# Patient Record
Sex: Male | Born: 1965 | Race: White | Hispanic: No | Smoking: Former smoker
Health system: Southern US, Community
[De-identification: ages and names within clinical notes are randomized; demographics above are authoritative.]

## PROBLEM LIST (undated history)

## (undated) DIAGNOSIS — K5792 Diverticulitis of intestine, part unspecified, without perforation or abscess without bleeding: Secondary | ICD-10-CM

## (undated) DIAGNOSIS — K760 Fatty (change of) liver, not elsewhere classified: Secondary | ICD-10-CM

## (undated) DIAGNOSIS — I10 Essential (primary) hypertension: Secondary | ICD-10-CM

## (undated) DIAGNOSIS — F419 Anxiety disorder, unspecified: Secondary | ICD-10-CM

## (undated) DIAGNOSIS — K589 Irritable bowel syndrome without diarrhea: Secondary | ICD-10-CM

## (undated) DIAGNOSIS — F329 Major depressive disorder, single episode, unspecified: Secondary | ICD-10-CM

## (undated) DIAGNOSIS — K859 Acute pancreatitis without necrosis or infection, unspecified: Secondary | ICD-10-CM

## (undated) DIAGNOSIS — K219 Gastro-esophageal reflux disease without esophagitis: Secondary | ICD-10-CM

## (undated) DIAGNOSIS — E785 Hyperlipidemia, unspecified: Secondary | ICD-10-CM

## (undated) DIAGNOSIS — F32A Depression, unspecified: Secondary | ICD-10-CM

## (undated) DIAGNOSIS — M1990 Unspecified osteoarthritis, unspecified site: Secondary | ICD-10-CM

## (undated) DIAGNOSIS — F1910 Other psychoactive substance abuse, uncomplicated: Secondary | ICD-10-CM

## (undated) DIAGNOSIS — R972 Elevated prostate specific antigen [PSA]: Secondary | ICD-10-CM

## (undated) HISTORY — DX: Essential (primary) hypertension: I10

## (undated) HISTORY — DX: Hyperlipidemia, unspecified: E78.5

## (undated) HISTORY — PX: PROSTATE BIOPSY: SHX241

## (undated) HISTORY — DX: Irritable bowel syndrome, unspecified: K58.9

## (undated) HISTORY — PX: TONSILECTOMY, ADENOIDECTOMY, BILATERAL MYRINGOTOMY AND TUBES: SHX2538

---

## 1995-03-19 HISTORY — PX: KNEE SURGERY: SHX244

## 2009-03-18 HISTORY — PX: SHOULDER ARTHROSCOPY WITH BICEPS TENDON REPAIR: SHX5674

## 2012-06-24 ENCOUNTER — Other Ambulatory Visit: Payer: Self-pay | Admitting: Family Medicine

## 2012-06-24 ENCOUNTER — Other Ambulatory Visit: Payer: Self-pay | Admitting: Nurse Practitioner

## 2012-06-25 NOTE — Telephone Encounter (Signed)
LAST SEEN 8/13

## 2012-06-25 NOTE — Telephone Encounter (Signed)
LAST LABS 8/13. 

## 2012-08-17 ENCOUNTER — Telehealth: Payer: Self-pay | Admitting: Family Medicine

## 2012-08-17 NOTE — Telephone Encounter (Signed)
APPT MADE

## 2012-08-18 ENCOUNTER — Ambulatory Visit (INDEPENDENT_AMBULATORY_CARE_PROVIDER_SITE_OTHER): Payer: 59 | Admitting: Physician Assistant

## 2012-08-18 ENCOUNTER — Encounter: Payer: Self-pay | Admitting: Physician Assistant

## 2012-08-18 VITALS — BP 149/82 | HR 77 | Temp 98.1°F | Ht 71.0 in | Wt 166.0 lb

## 2012-08-18 DIAGNOSIS — E785 Hyperlipidemia, unspecified: Secondary | ICD-10-CM

## 2012-08-18 DIAGNOSIS — J019 Acute sinusitis, unspecified: Secondary | ICD-10-CM

## 2012-08-18 DIAGNOSIS — I10 Essential (primary) hypertension: Secondary | ICD-10-CM

## 2012-08-18 DIAGNOSIS — W57XXXA Bitten or stung by nonvenomous insect and other nonvenomous arthropods, initial encounter: Secondary | ICD-10-CM

## 2012-08-18 MED ORDER — DOXYCYCLINE HYCLATE 100 MG PO TABS: 100.0000 mg | ORAL_TABLET | Freq: Two times a day (BID) | ORAL | Status: DC

## 2012-08-18 NOTE — Progress Notes (Signed)
Subjective:     Patient ID: Seth Mcmillan, male   DOB: 12-Apr-1965, 47 y.o.   MRN: [id number]  HPI Pt with progressive sinus pain and congestion over the last week Pain to the L side of the face as well + fever and general malaise Pt with hx of same He has also had mult tick bites  Review of Systems  All other systems reviewed and are negative.       Objective:   Physical Exam  Nursing note and vitals reviewed. Ears- canals/TM's nl Oral- no tonsils, no increase in tonsil size, no exudate No cerv nodes Heart- RRR w/o M Lungs- CTA + TTP L maxillary sinus     Assessment:     1. Sinusitis, acute   2. Tick bite   3. HTN (hypertension)   4. Other and unspecified hyperlipidemia        Plan:     OTC meds for sx Doxycycline rx F/U prn

## 2012-08-18 NOTE — Patient Instructions (Signed)

## 2012-08-28 ENCOUNTER — Other Ambulatory Visit: Payer: Self-pay | Admitting: Family Medicine

## 2012-08-31 NOTE — Telephone Encounter (Signed)
You are only person he has seen since 08/13?

## 2012-09-09 ENCOUNTER — Other Ambulatory Visit: Payer: Self-pay | Admitting: *Deleted

## 2012-09-09 MED ORDER — FENOFIBRATE 145 MG PO TABS: 145.0000 mg | ORAL_TABLET | Freq: Every day | ORAL | Status: DC

## 2012-09-09 MED ORDER — ROSUVASTATIN CALCIUM 10 MG PO TABS: 10.0000 mg | ORAL_TABLET | Freq: Every day | ORAL | Status: DC

## 2012-09-09 NOTE — Telephone Encounter (Signed)
LAST LABS 8/13. 

## 2012-10-30 ENCOUNTER — Other Ambulatory Visit: Payer: Self-pay | Admitting: Physician Assistant

## 2012-11-11 ENCOUNTER — Telehealth: Payer: Self-pay | Admitting: Nurse Practitioner

## 2012-11-11 NOTE — Telephone Encounter (Signed)
APPT MADE

## 2012-11-12 ENCOUNTER — Encounter: Payer: Self-pay | Admitting: Family Medicine

## 2012-11-12 ENCOUNTER — Ambulatory Visit (INDEPENDENT_AMBULATORY_CARE_PROVIDER_SITE_OTHER): Payer: 59 | Admitting: Family Medicine

## 2012-11-12 VITALS — BP 145/97 | HR 78 | Temp 97.9°F | Ht 71.0 in | Wt 168.8 lb

## 2012-11-12 DIAGNOSIS — M7711 Lateral epicondylitis, right elbow: Secondary | ICD-10-CM

## 2012-11-12 DIAGNOSIS — M7710 Lateral epicondylitis, unspecified elbow: Secondary | ICD-10-CM

## 2012-11-12 MED ORDER — NAPROXEN 500 MG PO TABS: 500.0000 mg | ORAL_TABLET | Freq: Two times a day (BID) | ORAL | Status: DC

## 2012-11-12 NOTE — Progress Notes (Signed)
  Subjective:    Patient ID: DEMETREUS LOTHAMER, male    DOB: December 05, 1965, 47 y.o.   MRN: [id number]  HPI This 47 y.o. male presents for evaluation of left elbow pain and discomfort.  He has been Doing a lot of repetitive movement with his left elbow and he gets pain in this from time to time. He has had injection to his left elbow in the past and this worked.   Review of Systems C/o left elbow pain.   No chest pain, SOB, HA, dizziness, vision change, N/V, diarrhea, constipation, dysuria, urinary urgency or frequency  or rash.  Objective:   Physical Exam  Vital signs noted  Well developed well nourished male.  HEENT - Head atraumatic Normocephalic Respiratory - Lungs CTA bilateral Cardiac - RRR S1 and S2 without murmur MS - TTP left lateral epicondyle.  Left lateral epicondyle prepped with ETOH and ethyl chloride sprayed for anesthesia and then injected in trigger point fashion With 1/2 cc of kenalog and 1/2 cc of lidocaine.  Patient tolerates well.      Assessment & Plan:  Lateral epicondylitis, right - Plan: naproxen (NAPROSYN) 500 MG tablet

## 2012-11-12 NOTE — Patient Instructions (Signed)
Lateral Epicondylitis (Tennis Elbow) with Rehab Lateral epicondylitis involves inflammation and pain around the outer portion of the elbow. The pain is caused by inflammation of the tendons in the forearm that bring back (extend) the wrist. Lateral epicondylittis is also called tennis elbow, because it is very common in tennis players. However, it may occur in any individual who extends the wrist repetitively. If lateral epicondylitis is left untreated, it may become a chronic problem. SYMPTOMS   Pain, tenderness, and inflammation on the outer (lateral) side of the elbow.  Pain or weakness with gripping activities.  Pain that increases with wrist twisting motions (playing tennis, using a screwdriver, opening a door or a jar).  Pain with lifting objects, including a coffee cup. CAUSES  Lateral epicondylitis is caused by inflammation of the tendons that extend the wrist. Causes of injury may include:  Repetitive stress and strain on the muscles and tendons that extend the wrist.  Sudden change in activity level or intensity.  Incorrect grip in racquet sports.  Incorrect grip size of racquet (often too large).  Incorrect hitting position or technique (usually backhand, leading with the elbow).  Using a racket that is too heavy. RISK INCREASES WITH:  Sports or occupations that require repetitive and/or strenuous forearm and wrist movements (tennis, squash, racquetball, carpentry).  Poor wrist and forearm strength and flexibility.  Failure to warm up properly before activity.  Resuming activity before healing, rehabilitation, and conditioning are complete. PREVENTION   Warm up and stretch properly before activity.  Maintain physical fitness:  Strength, flexibility, and endurance.  Cardiovascular fitness.  Wear and use properly fitted equipment.  Learn and use proper technique and have a coach correct improper technique.  Wear a tennis elbow (counterforce) brace. PROGNOSIS   The course of this condition depends on the degree of the injury. If treated properly, acute cases (symptoms lasting less than 4 weeks) are often resolved in 2 to 6 weeks. Chronic (longer lasting cases) often resolve in 3 to 6 months, but may require physical therapy. RELATED COMPLICATIONS   Frequently recurring symptoms, resulting in a chronic problem. Properly treating the problem the first time decreases frequency of recurrence.  Chronic inflammation, scarring tendon degeneration, and partial tendon tear, requiring surgery.  Delayed healing or resolution of symptoms. TREATMENT  Treatment first involves the use of ice and medicine, to reduce pain and inflammation. Strengthening and stretching exercises may help reduce discomfort, if performed regularly. These exercises may be performed at home, if the condition is an acute injury. Chronic cases may require a referral to a physical therapist for evaluation and treatment. Your caregiver may advise a corticosteroid injection, to help reduce inflammation. Rarely, surgery is needed. MEDICATION  If pain medicine is needed, nonsteroidal anti-inflammatory medicines (aspirin and ibuprofen), or other minor pain relievers (acetaminophen), are often advised.  Do not take pain medicine for 7 days before surgery.  Prescription pain relievers may be given, if your caregiver thinks they are needed. Use only as directed and only as much as you need.  Corticosteroid injections may be recommended. These injections should be reserved only for the most severe cases, because they can only be given a certain number of times. HEAT AND COLD  Cold treatment (icing) should be applied for 10 to 15 minutes every 2 to 3 hours for inflammation and pain, and immediately after activity that aggravates your symptoms. Use ice packs or an ice massage.  Heat treatment may be used before performing stretching and strengthening activities prescribed by your   caregiver, physical  therapist, or athletic trainer. Use a heat pack or a warm water soak. SEEK MEDICAL CARE IF: Symptoms get worse or do not improve in 2 weeks, despite treatment. EXERCISES  RANGE OF MOTION (ROM) AND STRETCHING EXERCISES - Epicondylitis, Lateral (Tennis Elbow) These exercises may help you when beginning to rehabilitate your injury. Your symptoms may go away with or without further involvement from your physician, physical therapist or athletic trainer. While completing these exercises, remember:   Restoring tissue flexibility helps normal motion to return to the joints. This allows healthier, less painful movement and activity.  An effective stretch should be held for at least 30 seconds.  A stretch should never be painful. You should only feel a gentle lengthening or release in the stretched tissue. RANGE OF MOTION  Wrist Flexion, Active-Assisted  Extend your right / left elbow with your fingers pointing down.*  Gently pull the back of your hand towards you, until you feel a gentle stretch on the top of your forearm.  Hold this position for __________ seconds. Repeat __________ times. Complete this exercise __________ times per day.  *If directed by your physician, physical therapist or athletic trainer, complete this stretch with your elbow bent, rather than extended. RANGE OF MOTION  Wrist Extension, Active-Assisted  Extend your right / left elbow and turn your palm upwards.*  Gently pull your palm and fingertips back, so your wrist extends and your fingers point more toward the ground.  You should feel a gentle stretch on the inside of your forearm.  Hold this position for __________ seconds. Repeat __________ times. Complete this exercise __________ times per day. *If directed by your physician, physical therapist or athletic trainer, complete this stretch with your elbow bent, rather than extended. STRETCH - Wrist Flexion  Place the back of your right / left hand on a tabletop,  leaving your elbow slightly bent. Your fingers should point away from your body.  Gently press the back of your hand down onto the table by straightening your elbow. You should feel a stretch on the top of your forearm.  Hold this position for __________ seconds. Repeat __________ times. Complete this stretch __________ times per day.  STRETCH  Wrist Extension   Place your right / left fingertips on a tabletop, leaving your elbow slightly bent. Your fingers should point backwards.  Gently press your fingers and palm down onto the table by straightening your elbow. You should feel a stretch on the inside of your forearm.  Hold this position for __________ seconds. Repeat __________ times. Complete this stretch __________ times per day.  STRENGTHENING EXERCISES - Epicondylitis, Lateral (Tennis Elbow) These exercises may help you when beginning to rehabilitate your injury. They may resolve your symptoms with or without further involvement from your physician, physical therapist or athletic trainer. While completing these exercises, remember:   Muscles can gain both the endurance and the strength needed for everyday activities through controlled exercises.  Complete these exercises as instructed by your physician, physical therapist or athletic trainer. Increase the resistance and repetitions only as guided.  You may experience muscle soreness or fatigue, but the pain or discomfort you are trying to eliminate should never worsen during these exercises. If this pain does get worse, stop and make sure you are following the directions exactly. If the pain is still present after adjustments, discontinue the exercise until you can discuss the trouble with your caregiver. STRENGTH Wrist Flexors  Sit with your right / left forearm palm-up and   fully supported on a table or countertop. Your elbow should be resting below the height of your shoulder. Allow your wrist to extend over the edge of the  surface.  Loosely holding a __________ weight, or a piece of rubber exercise band or tubing, slowly curl your hand up toward your forearm.  Hold this position for __________ seconds. Slowly lower the wrist back to the starting position in a controlled manner. Repeat __________ times. Complete this exercise __________ times per day.  STRENGTH  Wrist Extensors  Sit with your right / left forearm palm-down and fully supported on a table or countertop. Your elbow should be resting below the height of your shoulder. Allow your wrist to extend over the edge of the surface.  Loosely holding a __________ weight, or a piece of rubber exercise band or tubing, slowly curl your hand up toward your forearm.  Hold this position for __________ seconds. Slowly lower the wrist back to the starting position in a controlled manner. Repeat __________ times. Complete this exercise __________ times per day.  STRENGTH - Ulnar Deviators  Stand with a ____________________ weight in your right / left hand, or sit while holding a rubber exercise band or tubing, with your healthy arm supported on a table or countertop.  Move your wrist, so that your pinkie travels toward your forearm and your thumb moves away from your forearm.  Hold this position for __________ seconds and then slowly lower the wrist back to the starting position. Repeat __________ times. Complete this exercise __________ times per day STRENGTH - Radial Deviators  Stand with a ____________________ weight in your right / left hand, or sit while holding a rubber exercise band or tubing, with your injured arm supported on a table or countertop.  Raise your hand upward in front of you or pull up on the rubber tubing.  Hold this position for __________ seconds and then slowly lower the wrist back to the starting position. Repeat __________ times. Complete this exercise __________ times per day. STRENGTH  Forearm Supinators   Sit with your right /  left forearm supported on a table, keeping your elbow below shoulder height. Rest your hand over the edge, palm down.  Gently grip a hammer or a soup ladle.  Without moving your elbow, slowly turn your palm and hand upward to a "thumbs-up" position.  Hold this position for __________ seconds. Slowly return to the starting position. Repeat __________ times. Complete this exercise __________ times per day.  STRENGTH  Forearm Pronators   Sit with your right / left forearm supported on a table, keeping your elbow below shoulder height. Rest your hand over the edge, palm up.  Gently grip a hammer or a soup ladle.  Without moving your elbow, slowly turn your palm and hand upward to a "thumbs-up" position.  Hold this position for __________ seconds. Slowly return to the starting position. Repeat __________ times. Complete this exercise __________ times per day.  STRENGTH - Grip  Grasp a tennis ball, a dense sponge, or a large, rolled sock in your hand.  Squeeze as hard as you can, without increasing any pain.  Hold this position for __________ seconds. Release your grip slowly. Repeat __________ times. Complete this exercise __________ times per day.  STRENGTH - Elbow Extensors, Isometric  Stand or sit upright, on a firm surface. Place your right / left arm so that your palm faces your stomach, and it is at the height of your waist.  Place your opposite hand on   the underside of your forearm. Gently push up as your right / left arm resists. Push as hard as you can with both arms, without causing any pain or movement at your right / left elbow. Hold this stationary position for __________ seconds. Gradually release the tension in both arms. Allow your muscles to relax completely before repeating. Document Released: 03/04/2005 Document Revised: 05/27/2011 Document Reviewed: 06/16/2008 [employer] Patient Information 2014 [employer].  

## 2012-12-17 ENCOUNTER — Other Ambulatory Visit: Payer: Self-pay | Admitting: Family Medicine

## 2012-12-21 NOTE — Telephone Encounter (Signed)
Patient needs to be seen. Has exceeded time since last visit. Needs to bring all medications to next appointment.   

## 2012-12-21 NOTE — Telephone Encounter (Signed)
Last lipid 8/13   ACM

## 2012-12-30 ENCOUNTER — Encounter: Payer: Self-pay | Admitting: Family Medicine

## 2012-12-30 ENCOUNTER — Ambulatory Visit (INDEPENDENT_AMBULATORY_CARE_PROVIDER_SITE_OTHER): Payer: 59 | Admitting: Family Medicine

## 2012-12-30 VITALS — BP 144/87 | HR 75 | Temp 97.5°F | Ht 71.0 in | Wt 170.8 lb

## 2012-12-30 DIAGNOSIS — I10 Essential (primary) hypertension: Secondary | ICD-10-CM

## 2012-12-30 DIAGNOSIS — E785 Hyperlipidemia, unspecified: Secondary | ICD-10-CM

## 2012-12-30 DIAGNOSIS — Z0000 Encounter for general adult medical examination without abnormal findings: Secondary | ICD-10-CM

## 2012-12-30 LAB — POCT CBC
Granulocyte percent: 74.3 %G (ref 37–80)
HCT, POC: 43.5 % (ref 43.5–53.7)
Hemoglobin: 14.8 g/dL (ref 14.1–18.1)
Lymph, poc: 2 (ref 0.6–3.4)
MCH, POC: 31.5 pg — AB (ref 27–31.2)
MCHC: 34 g/dL (ref 31.8–35.4)
MCV: 92.8 fL (ref 80–97)
MPV: 8.7 fL (ref 0–99.8)
POC Granulocyte: 7.2 — AB (ref 2–6.9)
POC LYMPH PERCENT: 21.1 %L (ref 10–50)
Platelet Count, POC: 271 10*3/uL (ref 142–424)
RBC: 4.7 M/uL (ref 4.69–6.13)
RDW, POC: 13.3 %
WBC: 9.7 10*3/uL (ref 4.6–10.2)

## 2012-12-30 MED ORDER — FENOFIBRATE 145 MG PO TABS: 145.0000 mg | ORAL_TABLET | Freq: Every day | ORAL | Status: DC

## 2012-12-30 MED ORDER — AMLODIPINE BESYLATE 5 MG PO TABS: 5.0000 mg | ORAL_TABLET | Freq: Every day | ORAL | Status: DC

## 2012-12-30 MED ORDER — ROSUVASTATIN CALCIUM 10 MG PO TABS: 10.0000 mg | ORAL_TABLET | Freq: Every day | ORAL | Status: DC

## 2012-12-30 NOTE — Patient Instructions (Signed)
Hypertension Information As your heart beats, it forces blood through your arteries. This force is your blood pressure. If the pressure is too high, it is called hypertension (HTN) or high blood pressure. HTN is dangerous because you may have it and not know it. High blood pressure may mean that your heart has to work harder to pump blood. Your arteries may be narrow or stiff. The extra work puts you at risk for heart disease, stroke, and other problems.  Blood pressure consists of two numbers, a higher number over a lower, 110/72, for example. It is stated as "110 over 72." The ideal is below 120 for the top number (systolic) and under 80 for the bottom (diastolic).  You should pay close attention to your blood pressure if you have certain conditions such as:  Heart failure.   Prior heart attack.   Diabetes   Chronic kidney disease.   Prior stroke.   Multiple risk factors for heart disease.  To see if you have HTN, your blood pressure should be measured while you are seated with your arm held at the level of the heart. It should be measured at least twice. A one-time elevated blood pressure reading (especially in the Emergency Department) does not mean that you need treatment. There may be conditions in which the blood pressure is different between your right and left arms. It is important to see your caregiver soon for a recheck. Most people have essential hypertension which means that there is not a specific cause. This type of high blood pressure may be lowered by changing lifestyle factors such as:  Stress.   Smoking.   Lack of exercise.   Excessive weight.   Drug/tobacco/alcohol use.   Eating less salt.  Most people do not have symptoms from high blood pressure until it has caused damage to the body. Effective treatment can often prevent, delay or reduce that damage. TREATMENT  Treatment for high blood pressure, when a cause has been identified, is directed at the cause. There  are a large number of medications to treat HTN. These fall into several categories, and your caregiver will help you select the medicines that are best for you. Medications may have side effects. You should review side effects with your caregiver. If your blood pressure stays high after you have made lifestyle changes or started on medicines,   Your medication(s) may need to be changed.   Other problems may need to be addressed.   Be certain you understand your prescriptions, and know how and when to take your medicine.   Be sure to follow up with your caregiver within the time frame advised (usually within two weeks) to have your blood pressure rechecked and to review your medications.   If you are taking more than one medicine to lower your blood pressure, make sure you know how and at what times they should be taken. Taking two medicines at the same time can result in blood pressure that is too low.  Document Released: 05/07/2005 Document Revised: 11/14/2010 Document Reviewed: 05/14/2007 [employer] Patient Information 2012 [employer]. 

## 2012-12-30 NOTE — Progress Notes (Signed)
  Subjective:    Patient ID: Seth Mcmillan, male    DOB: 18-Apr-1965, 47 y.o.   MRN: [id number]  HPI This 47 y.o. male presents for evaluation of hypertension, hyperlipidemia, and get CPE. He has no acute medical problems.   Review of Systems No chest pain, SOB, HA, dizziness, vision change, N/V, diarrhea, constipation, dysuria, urinary urgency or frequency, myalgias, arthralgias or rash.     Objective:   Physical Exam  Vital signs noted  Well developed well nourished male.  HEENT - Head atraumatic Normocephalic                Eyes - PERRLA, Conjuctiva - clear Sclera- Clear EOMI                Ears - EAC's Wnl TM's Wnl Gross Hearing WNL                Nose - Nares patent                 Throat - oropharanx wnl Respiratory - Lungs CTA bilateral Cardiac - RRR S1 and S2 without murmur GI - Abdomen soft Nontender and bowel sounds active x 4 Extremities - No edema. Neuro - Grossly intact.      Assessment & Plan:  Routine general medical examination at a health care facility - Plan: amLODipine (NORVASC) 5 MG tablet, fenofibrate (TRICOR) 145 MG tablet, rosuvastatin (CRESTOR) 10 MG tablet, POCT CBC, CMP14+EGFR, Lipid panel, PSA, total and free, Thyroid Panel With TSH  Essential hypertension, benign - Plan: amLODipine (NORVASC) 5 MG tablet  Other and unspecified hyperlipidemia - Plan: fenofibrate (TRICOR) 145 MG tablet, rosuvastatin (CRESTOR) 10 MG tablet  Deatra Canter FNP

## 2012-12-31 LAB — CMP14+EGFR
ALT: 29 IU/L (ref 0–44)
AST: 36 IU/L (ref 0–40)
Albumin/Globulin Ratio: 2.2 (ref 1.1–2.5)
Albumin: 4.3 g/dL (ref 3.5–5.5)
Alkaline Phosphatase: 62 IU/L (ref 39–117)
BUN/Creatinine Ratio: 8 — ABNORMAL LOW (ref 9–20)
BUN: 9 mg/dL (ref 6–24)
CO2: 21 mmol/L (ref 18–29)
Calcium: 9.1 mg/dL (ref 8.7–10.2)
Chloride: 99 mmol/L (ref 97–108)
Creatinine, Ser: 1.08 mg/dL (ref 0.76–1.27)
GFR calc Af Amer: 93 mL/min/{1.73_m2} (ref >59)
GFR calc non Af Amer: 80 mL/min/{1.73_m2} (ref >59)
Globulin, Total: 2 g/dL (ref 1.5–4.5)
Glucose: 93 mg/dL (ref 65–99)
Potassium: 4.5 mmol/L (ref 3.5–5.2)
Sodium: 137 mmol/L (ref 134–144)
Total Bilirubin: 0.6 mg/dL (ref 0.0–1.2)
Total Protein: 6.3 g/dL (ref 6.0–8.5)

## 2012-12-31 LAB — PSA, TOTAL AND FREE
PSA, Free Pct: 17.6 %
PSA, Free: 0.44 ng/mL
PSA: 2.5 ng/mL (ref 0.0–4.0)

## 2012-12-31 LAB — THYROID PANEL WITH TSH
Free Thyroxine Index: 1.7 (ref 1.2–4.9)
T3 Uptake Ratio: 32 % (ref 24–39)
T4, Total: 5.2 ug/dL (ref 4.5–12.0)
TSH: 2.34 u[IU]/mL (ref 0.450–4.500)

## 2012-12-31 LAB — LIPID PANEL
Chol/HDL Ratio: 2.4 ratio units (ref 0.0–5.0)
Cholesterol, Total: 187 mg/dL (ref 100–199)
HDL: 79 mg/dL (ref >39)
LDL Calculated: 91 mg/dL (ref 0–99)
Triglycerides: 85 mg/dL (ref 0–149)
VLDL Cholesterol Cal: 17 mg/dL (ref 5–40)

## 2013-11-16 ENCOUNTER — Telehealth: Payer: Self-pay | Admitting: Family Medicine

## 2013-11-16 NOTE — Telephone Encounter (Signed)
appt given for 2:30 with Bill on 9/2

## 2013-11-17 ENCOUNTER — Ambulatory Visit (INDEPENDENT_AMBULATORY_CARE_PROVIDER_SITE_OTHER): Payer: 59 | Admitting: Family Medicine

## 2013-11-17 VITALS — BP 139/86 | HR 71 | Temp 97.4°F | Ht 71.0 in | Wt 166.0 lb

## 2013-11-17 DIAGNOSIS — M545 Low back pain, unspecified: Secondary | ICD-10-CM

## 2013-11-17 MED ORDER — NAPROXEN 500 MG PO TABS: 500.0000 mg | ORAL_TABLET | Freq: Two times a day (BID) | ORAL | Status: DC

## 2013-11-17 MED ORDER — CYCLOBENZAPRINE HCL 10 MG PO TABS: 10.0000 mg | ORAL_TABLET | Freq: Three times a day (TID) | ORAL | Status: DC | PRN

## 2013-11-17 NOTE — Progress Notes (Signed)
   Subjective:    Patient ID: Seth Mcmillan, male    DOB: 1966-02-27, 48 y.o.   MRN: [id number]  HPI  This 48 y.o. male presents for evaluation of back pain for a couple days.  Review of Systems No chest pain, SOB, HA, dizziness, vision change, N/V, diarrhea, constipation, dysuria, urinary urgency or frequency, myalgias, arthralgias or rash.     Objective:   Physical Exam  Vital signs noted  Well developed well nourished male.  HEENT - Head atraumatic Normocephalic Respiratory - Lungs CTA bilateral Cardiac - RRR S1 and S2 without murmur GI - Abdomen soft Nontender and bowel sounds active x 4 MS - TTP right LS  muscles     Assessment & Plan:  Right-sided low back pain without sciatica - Plan: naproxen (NAPROSYN) 500 MG tablet, cyclobenzaprine (FLEXERIL) 10 MG tablet  Lysbeth Penner FNP

## 2014-03-30 ENCOUNTER — Other Ambulatory Visit: Payer: Self-pay | Admitting: Family Medicine

## 2014-04-06 ENCOUNTER — Other Ambulatory Visit: Payer: Self-pay | Admitting: *Deleted

## 2014-04-06 DIAGNOSIS — Z0000 Encounter for general adult medical examination without abnormal findings: Secondary | ICD-10-CM

## 2014-04-06 DIAGNOSIS — I10 Essential (primary) hypertension: Secondary | ICD-10-CM

## 2014-04-06 MED ORDER — AMLODIPINE BESYLATE 5 MG PO TABS: 5.0000 mg | ORAL_TABLET | Freq: Every day | ORAL | Status: DC

## 2014-04-07 ENCOUNTER — Ambulatory Visit: Payer: Self-pay | Admitting: Family Medicine

## 2014-04-07 ENCOUNTER — Telehealth: Payer: Self-pay | Admitting: Family Medicine

## 2014-04-07 DIAGNOSIS — I10 Essential (primary) hypertension: Secondary | ICD-10-CM

## 2014-04-07 DIAGNOSIS — Z0000 Encounter for general adult medical examination without abnormal findings: Secondary | ICD-10-CM

## 2014-04-07 MED ORDER — AMLODIPINE BESYLATE 5 MG PO TABS: 5.0000 mg | ORAL_TABLET | Freq: Every day | ORAL | Status: DC

## 2014-04-07 NOTE — Telephone Encounter (Signed)
done

## 2014-04-27 ENCOUNTER — Encounter: Payer: Self-pay | Admitting: Family Medicine

## 2014-04-27 ENCOUNTER — Ambulatory Visit (INDEPENDENT_AMBULATORY_CARE_PROVIDER_SITE_OTHER): Payer: 59 | Admitting: Family Medicine

## 2014-04-27 VITALS — BP 135/85 | HR 70 | Temp 97.1°F | Ht 71.0 in | Wt 171.0 lb

## 2014-04-27 DIAGNOSIS — Z0000 Encounter for general adult medical examination without abnormal findings: Secondary | ICD-10-CM

## 2014-04-27 DIAGNOSIS — E785 Hyperlipidemia, unspecified: Secondary | ICD-10-CM

## 2014-04-27 DIAGNOSIS — I10 Essential (primary) hypertension: Secondary | ICD-10-CM

## 2014-04-27 MED ORDER — AMLODIPINE BESYLATE 5 MG PO TABS: 5.0000 mg | ORAL_TABLET | Freq: Every day | ORAL | Status: DC

## 2014-04-27 MED ORDER — FENOFIBRATE 145 MG PO TABS: 145.0000 mg | ORAL_TABLET | Freq: Every day | ORAL | Status: DC

## 2014-04-27 MED ORDER — ROSUVASTATIN CALCIUM 10 MG PO TABS: 10.0000 mg | ORAL_TABLET | Freq: Every day | ORAL | Status: DC

## 2014-04-27 NOTE — Progress Notes (Signed)
Subjective:    Patient ID: Seth Mcmillan, male    DOB: 10-27-1965, 49 y.o.   MRN: [id number]  HPI  49 year old gentleman here to follow-up lipids and blood pressure. He currently takes amlodipine, TriCor, and Crestor. He denies any side effects or problems with medication and he is basically asymptomatic. He was seen for 5 months ago for recurring back pain but that is not chronic. Reviewing his labs PSA was while in the normal range slightly elevated. He is not having any symptoms of infection or enlargement but we will repeat that again today. It has also been greater than 1 year since we checked his lipids and liver function tests and that needs to be checked  Patient Active Problem List   Diagnosis Date Noted  . HTN (hypertension) 08/18/2012  . Other and unspecified hyperlipidemia 08/18/2012   Outpatient Encounter Prescriptions as of 04/27/2014  Medication Sig  . amLODipine (NORVASC) 5 MG tablet Take 1 tablet (5 mg total) by mouth daily.  . fenofibrate (TRICOR) 145 MG tablet Take 1 tablet (145 mg total) by mouth daily.  . rosuvastatin (CRESTOR) 10 MG tablet Take 1 tablet (10 mg total) by mouth daily.  . [DISCONTINUED] cyclobenzaprine (FLEXERIL) 10 MG tablet Take 1 tablet (10 mg total) by mouth 3 (three) times daily as needed for muscle spasms.  . [DISCONTINUED] naproxen (NAPROSYN) 500 MG tablet Take 1 tablet (500 mg total) by mouth 2 (two) times daily with a meal.      Review of Systems  Constitutional: Negative.   HENT: Negative.   Eyes: Negative.   Respiratory: Negative.  Negative for shortness of breath.   Cardiovascular: Negative.  Negative for chest pain and leg swelling.  Gastrointestinal: Negative.   Genitourinary: Negative.   Musculoskeletal: Negative.   Skin: Negative.   Neurological: Negative.   Psychiatric/Behavioral: Negative.   All other systems reviewed and are negative.      Objective:   Physical Exam  Constitutional: He is oriented to person, place, and time.  He appears well-developed and well-nourished.  HENT:  Head: Normocephalic.  Right Ear: External ear normal.  Left Ear: External ear normal.  Nose: Nose normal.  Mouth/Throat: Oropharynx is clear and moist.  Eyes: Conjunctivae and EOM are normal. Pupils are equal, round, and reactive to light.  Neck: Normal range of motion. Neck supple.  Cardiovascular: Normal rate, regular rhythm, normal heart sounds and intact distal pulses.   Pulmonary/Chest: Effort normal and breath sounds normal.  Abdominal: Soft. Bowel sounds are normal.  Musculoskeletal: Normal range of motion.  Neurological: He is alert and oriented to person, place, and time.  Skin: Skin is warm and dry.  Psychiatric: He has a normal mood and affect. His behavior is normal. Judgment and thought content normal.    BP 135/85 mmHg  Pulse 70  Temp(Src) 97.1 F (36.2 C) (Oral)  Ht $R'5\' 11"'NH$  (1.803 m)  Wt 171 lb (77.565 kg)  BMI 23.86 kg/m2       Assessment & Plan:  1. Hyperlipidemia  - Lipid panel - CMP14+EGFR - rosuvastatin (CRESTOR) 10 MG tablet; Take 1 tablet (10 mg total) by mouth daily.  Dispense: 90 tablet; Refill: 1 - fenofibrate (TRICOR) 145 MG tablet; Take 1 tablet (145 mg total) by mouth daily.  Dispense: 90 tablet; Refill: 1  2. Essential hypertension Blood pressure well controlled on amlodipine. Continue same  3. Health care maintenance As noted above PSA was even though normal slightly elevated in the normal range and needs follow-up -  PSA, total and free  4. Routine general medical examination at a health care facility* - rosuvastatin (CRESTOR) 10 MG tablet; Take 1 tablet (10 mg total) by mouth daily.  Dispense: 90 tablet; Refill: 1 - fenofibrate (TRICOR) 145 MG tablet; Take 1 tablet (145 mg total) by mouth daily.  Dispense: 90 tablet; Refill: 1 - amLODipine (NORVASC) 5 MG tablet; Take 1 tablet (5 mg total) by mouth daily.  Dispense: 90 tablet; Refill: 1  5. Essential hypertension, benign  - amLODipine  (NORVASC) 5 MG tablet; Take 1 tablet (5 mg total) by mouth daily.  Dispense: 90 tablet; Refill: 1  Wardell Honour MD

## 2014-04-28 ENCOUNTER — Other Ambulatory Visit: Payer: Self-pay

## 2014-04-28 DIAGNOSIS — R972 Elevated prostate specific antigen [PSA]: Secondary | ICD-10-CM

## 2014-04-28 LAB — LIPID PANEL
CHOLESTEROL TOTAL: 189 mg/dL (ref 100–199)
Chol/HDL Ratio: 2.5 ratio units (ref 0.0–5.0)
HDL: 77 mg/dL (ref >39)
LDL Calculated: 88 mg/dL (ref 0–99)
Triglycerides: 122 mg/dL (ref 0–149)
VLDL Cholesterol Cal: 24 mg/dL (ref 5–40)

## 2014-04-28 LAB — CMP14+EGFR
ALBUMIN: 4.2 g/dL (ref 3.5–5.5)
ALK PHOS: 62 IU/L (ref 39–117)
ALT: 23 IU/L (ref 0–44)
AST: 28 IU/L (ref 0–40)
Albumin/Globulin Ratio: 1.9 (ref 1.1–2.5)
BILIRUBIN TOTAL: 0.4 mg/dL (ref 0.0–1.2)
BUN/Creatinine Ratio: 10 (ref 9–20)
BUN: 13 mg/dL (ref 6–24)
CHLORIDE: 99 mmol/L (ref 97–108)
CO2: 21 mmol/L (ref 18–29)
CREATININE: 1.26 mg/dL (ref 0.76–1.27)
Calcium: 9.2 mg/dL (ref 8.7–10.2)
GFR calc Af Amer: 76 mL/min/{1.73_m2} (ref >59)
GFR calc non Af Amer: 66 mL/min/{1.73_m2} (ref >59)
GLUCOSE: 103 mg/dL — AB (ref 65–99)
Globulin, Total: 2.2 g/dL (ref 1.5–4.5)
Potassium: 4.8 mmol/L (ref 3.5–5.2)
Sodium: 136 mmol/L (ref 134–144)
Total Protein: 6.4 g/dL (ref 6.0–8.5)

## 2014-04-28 LAB — PSA, TOTAL AND FREE
PSA FREE: 0.46 ng/mL
PSA, Free Pct: 12.4 %
PSA: 3.7 ng/mL (ref 0.0–4.0)

## 2015-04-03 ENCOUNTER — Ambulatory Visit: Payer: 59 | Admitting: Family Medicine

## 2015-04-04 ENCOUNTER — Telehealth: Payer: Self-pay | Admitting: Family Medicine

## 2015-04-04 DIAGNOSIS — Z0000 Encounter for general adult medical examination without abnormal findings: Secondary | ICD-10-CM

## 2015-04-04 DIAGNOSIS — I10 Essential (primary) hypertension: Secondary | ICD-10-CM

## 2015-04-06 NOTE — Telephone Encounter (Signed)
Last seen 04/2014 

## 2015-04-07 MED ORDER — AMLODIPINE BESYLATE 5 MG PO TABS: 5.0000 mg | ORAL_TABLET | Freq: Every day | ORAL | Status: DC

## 2015-04-07 NOTE — Telephone Encounter (Signed)
It appears that a prescription was called in on 117 but it's time for your yearly office visit

## 2015-04-11 ENCOUNTER — Ambulatory Visit: Payer: 59 | Admitting: Family Medicine

## 2015-04-11 NOTE — Telephone Encounter (Signed)
appt has been scheduled for 1/24

## 2015-04-12 ENCOUNTER — Encounter: Payer: Self-pay | Admitting: Family Medicine

## 2015-04-20 ENCOUNTER — Telehealth: Payer: Self-pay | Admitting: Family Medicine

## 2015-04-20 ENCOUNTER — Encounter: Payer: Self-pay | Admitting: Family Medicine

## 2015-04-20 ENCOUNTER — Ambulatory Visit (INDEPENDENT_AMBULATORY_CARE_PROVIDER_SITE_OTHER): Payer: 59 | Admitting: Family Medicine

## 2015-04-20 VITALS — BP 148/93 | HR 83 | Temp 97.0°F | Ht 71.0 in | Wt 175.0 lb

## 2015-04-20 DIAGNOSIS — E785 Hyperlipidemia, unspecified: Secondary | ICD-10-CM | POA: Diagnosis not present

## 2015-04-20 DIAGNOSIS — I10 Essential (primary) hypertension: Secondary | ICD-10-CM | POA: Diagnosis not present

## 2015-04-20 NOTE — Progress Notes (Signed)
   Subjective:    Patient ID: Seth Mcmillan, male    DOB: 1965/10/14, 50 y.o.   MRN: [id number]  Hypertension  Hyperlipidemia   Pt here for follow up and management of chronic medical problems which includes hyperlipidemia, and hypertension.  He is taking medications regularly. Only complaint today is some left elbow pain. This is been chronic over the past 15-20 years initially resulting from an injury. He has had steroid shots without much relief. He had elevated PSA and biopsy which showed prostate cancer. That is being followed every 3-6 months by urologist. No surgery is planned at present time. He did not take his medicines today and his blood pressure is elevated. In reviewing his medicines I noticed that he is on a statin as well as a fibrate. Latest recommendation is definitely not to use these together.       Patient Active Problem List   Diagnosis Date Noted  . HTN (hypertension) 08/18/2012  . Hyperlipidemia 08/18/2012   Outpatient Encounter Prescriptions as of 04/20/2015  Medication Sig  . amLODipine (NORVASC) 5 MG tablet Take 1 tablet (5 mg total) by mouth daily.  . fenofibrate (TRICOR) 145 MG tablet Take 1 tablet (145 mg total) by mouth daily.  . rosuvastatin (CRESTOR) 10 MG tablet Take 1 tablet (10 mg total) by mouth daily.   No facility-administered encounter medications on file as of 04/20/2015.     Review of Systems  Constitutional: Negative.   HENT: Negative.   Eyes: Negative.   Respiratory: Negative.   Cardiovascular: Negative.   Gastrointestinal: Negative.   Endocrine: Negative.   Genitourinary: Negative.   Musculoskeletal: Positive for arthralgias (left elbow pain ).  Skin: Negative.   Allergic/Immunologic: Negative.   Neurological: Negative.   Hematological: Negative.   Psychiatric/Behavioral: Negative.        Objective:   Physical Exam  Constitutional: He appears well-developed and well-nourished.  HENT:  Head: Normocephalic.  Cardiovascular:  Normal rate, regular rhythm and normal heart sounds.   Pulmonary/Chest: Effort normal and breath sounds normal.  Musculoskeletal:  Left elbow: There is no pain with supination and pronation against resistance. There is no pain with dorsiflexion of the hand against resistance. There is no tenderness over the lateral or medial epicondyles.   BP 148/93 mmHg  Pulse 83  Temp(Src) 97 F (36.1 C) (Oral)  Ht '5\' 11"'$  (1.803 m)  Wt 175 lb (79.379 kg)  BMI 24.42 kg/m2        Assessment & Plan:  1. Essential hypertension, benign [name] is elevated today without medicine I have asked him to check it some at drugstore. Goal would be 135/85 or below - CMP14+EGFR  2. Hyperlipidemia Pins are well managed but I would like him to stop TriCor and then we will repeat triglycerides/lipid panel in 6 months - Lipid panel  Wardell Honour MD

## 2015-04-21 LAB — CMP14+EGFR
A/G RATIO: 1.8 (ref 1.1–2.5)
ALT: 36 IU/L (ref 0–44)
AST: 43 IU/L — ABNORMAL HIGH (ref 0–40)
Albumin: 4.2 g/dL (ref 3.5–5.5)
Alkaline Phosphatase: 81 IU/L (ref 39–117)
BUN/Creatinine Ratio: 13 (ref 9–20)
BUN: 12 mg/dL (ref 6–24)
Bilirubin Total: 0.3 mg/dL (ref 0.0–1.2)
CALCIUM: 8.9 mg/dL (ref 8.7–10.2)
CO2: 22 mmol/L (ref 18–29)
Chloride: 101 mmol/L (ref 96–106)
Creatinine, Ser: 0.94 mg/dL (ref 0.76–1.27)
GFR, EST AFRICAN AMERICAN: 108 mL/min/{1.73_m2} (ref >59)
GFR, EST NON AFRICAN AMERICAN: 93 mL/min/{1.73_m2} (ref >59)
Globulin, Total: 2.3 g/dL (ref 1.5–4.5)
Glucose: 101 mg/dL — ABNORMAL HIGH (ref 65–99)
POTASSIUM: 4.4 mmol/L (ref 3.5–5.2)
Sodium: 139 mmol/L (ref 134–144)
TOTAL PROTEIN: 6.5 g/dL (ref 6.0–8.5)

## 2015-04-21 LAB — LIPID PANEL
Chol/HDL Ratio: 3 ratio units (ref 0.0–5.0)
Cholesterol, Total: 211 mg/dL — ABNORMAL HIGH (ref 100–199)
HDL: 70 mg/dL (ref >39)
LDL Calculated: 100 mg/dL — ABNORMAL HIGH (ref 0–99)
Triglycerides: 204 mg/dL — ABNORMAL HIGH (ref 0–149)
VLDL CHOLESTEROL CAL: 41 mg/dL — AB (ref 5–40)

## 2015-07-31 ENCOUNTER — Telehealth: Payer: Self-pay | Admitting: Family Medicine

## 2015-07-31 NOTE — Telephone Encounter (Signed)
No answer and the mailbox is full, appt has already been made with Dr.Miller tomorrow at 11.

## 2015-08-01 ENCOUNTER — Ambulatory Visit (INDEPENDENT_AMBULATORY_CARE_PROVIDER_SITE_OTHER): Payer: Commercial Managed Care - HMO | Admitting: Family Medicine

## 2015-08-01 ENCOUNTER — Encounter: Payer: Self-pay | Admitting: Family Medicine

## 2015-08-01 VITALS — BP 153/95 | HR 76 | Temp 97.7°F | Ht 71.0 in | Wt 168.6 lb

## 2015-08-01 DIAGNOSIS — R1084 Generalized abdominal pain: Secondary | ICD-10-CM | POA: Diagnosis not present

## 2015-08-01 NOTE — Progress Notes (Signed)
   Subjective:    Patient ID: Seth Mcmillan, male    DOB: 08-May-1965, 50 y.o.   MRN: [id number]  HPI 50 year old gentleman with a 3 to four-month history of diarrhea or increased bowel movements. He has had some pain is generalized after eating. He has had no blood in the diarrhea. He has had no weight loss. He has taken Imodium, OTC.  Patient Active Problem List   Diagnosis Date Noted  . HTN (hypertension) 08/18/2012  . Hyperlipidemia 08/18/2012   Outpatient Encounter Prescriptions as of 08/01/2015  Medication Sig  . amLODipine (NORVASC) 5 MG tablet Take 1 tablet (5 mg total) by mouth daily.  . rosuvastatin (CRESTOR) 10 MG tablet Take 1 tablet (10 mg total) by mouth daily.  . [DISCONTINUED] fenofibrate (TRICOR) 145 MG tablet Take 1 tablet (145 mg total) by mouth daily.   No facility-administered encounter medications on file as of 08/01/2015.      Review of Systems  Constitutional: Negative.   Gastrointestinal: Positive for nausea, abdominal pain and diarrhea.  Musculoskeletal: Negative.        Objective:   Physical Exam  Constitutional: He appears well-developed and well-nourished.  HENT:  Mouth/Throat: Oropharynx is clear and moist.  Cardiovascular: Normal rate and regular rhythm.   Pulmonary/Chest: Effort normal and breath sounds normal.  Abdominal: Soft. Bowel sounds are normal. He exhibits no mass. There is no tenderness. There is no guarding.          Assessment & Plan:  1. Generalized abdominal pain 3 to four-month history of diarrhea and abdominal pain. There is been no weight loss which makes me think this may be irritable bowel but since he is over 15) colonoscopy and would like him to have a screening exam at this time. Also with a postprandial nausea will check for gallbladder disease.  In the meantime I have asked him to stop all nonessential medicines. He takes several supplements that are designed to increase viriloity.. Also recommend holding all dairy  products including milk cheese ice cream. Begin probiotic, align. We will check him back after these had ultrasound colonoscopy and maybe these changes with his diet and medications.  Wardell Honour MD - Ambulatory referral to Gastroenterology

## 2015-08-01 NOTE — Patient Instructions (Addendum)
Align over the counter Stop all dairy products, vitamin C & D Return in a couple of weeks

## 2015-08-02 ENCOUNTER — Encounter: Payer: Self-pay | Admitting: Gastroenterology

## 2015-08-03 ENCOUNTER — Telehealth: Payer: Self-pay | Admitting: Family Medicine

## 2015-08-04 NOTE — Telephone Encounter (Signed)
ok 

## 2015-08-16 ENCOUNTER — Encounter: Payer: Self-pay | Admitting: Family Medicine

## 2015-08-16 ENCOUNTER — Ambulatory Visit (INDEPENDENT_AMBULATORY_CARE_PROVIDER_SITE_OTHER): Payer: Commercial Managed Care - HMO | Admitting: Family Medicine

## 2015-08-16 VITALS — BP 134/91 | HR 85 | Temp 96.8°F | Ht 71.0 in | Wt 168.6 lb

## 2015-08-16 DIAGNOSIS — R1084 Generalized abdominal pain: Secondary | ICD-10-CM

## 2015-08-16 MED ORDER — RANITIDINE HCL 150 MG PO TABS: 150.0000 mg | ORAL_TABLET | Freq: Two times a day (BID) | ORAL | Status: DC

## 2015-08-16 NOTE — Progress Notes (Signed)
   Subjective:    Patient ID: Seth Mcmillan, male    DOB: 06-03-1965, 50 y.o.   MRN: [id number]  HPI since last visit here he has continued with generalized pain and loose stools, about 4 a day. I asked him to discontinue all nonessential medicines including use of dairy products and ask him to pick up a probiotic. He tells me this has not made any difference in his symptoms. He had an ultrasound done looking for gallstones or any other abdominal pathology. That was negative. He does have an appointment with the gastroenterology coming up next week. At age 36 hopefully he will be a candidate for colonoscopy but I am beginning to think the picture looks more like IBS- D  Patient Active Problem List   Diagnosis Date Noted  . HTN (hypertension) 08/18/2012  . Hyperlipidemia 08/18/2012   Outpatient Encounter Prescriptions as of 08/16/2015  Medication Sig  . amLODipine (NORVASC) 5 MG tablet Take 1 tablet (5 mg total) by mouth daily.  . rosuvastatin (CRESTOR) 10 MG tablet Take 1 tablet (10 mg total) by mouth daily.   No facility-administered encounter medications on file as of 08/16/2015.      Review of Systems  Constitutional: Negative.   Respiratory: Negative.   Cardiovascular: Negative.   Gastrointestinal: Positive for abdominal pain.  Musculoskeletal: Negative.   Psychiatric/Behavioral: Negative.        Objective:   Physical Exam  Constitutional: He appears well-developed and well-nourished.  Abdominal: Soft. There is no tenderness. There is no guarding.   BP 134/91 mmHg  Pulse 85  Temp(Src) 96.8 F (36 C) (Oral)  Ht 5\' 11"  (1.803 m)  Wt 168 lb 9.6 oz (76.476 kg)  BMI 23.53 kg/m2        Assessment & Plan:  1. Generalized abdominal pain As above I suspect he may have irritable bowel syndrome with diarrhea. There is been no weight loss throughout the symptoms. Today's also complaining of some reflux symptoms and have suggested to start ranitidine 150 mg twice a day to see if  that would have any effect on both reflux and the loose stools. Encouraged him to keep appointment with gastroenterology next week

## 2015-08-22 ENCOUNTER — Ambulatory Visit (INDEPENDENT_AMBULATORY_CARE_PROVIDER_SITE_OTHER): Payer: Commercial Managed Care - HMO | Admitting: Nurse Practitioner

## 2015-08-22 ENCOUNTER — Other Ambulatory Visit: Payer: Self-pay

## 2015-08-22 ENCOUNTER — Encounter: Payer: Self-pay | Admitting: Nurse Practitioner

## 2015-08-22 VITALS — BP 149/96 | HR 62 | Temp 97.1°F | Ht 71.0 in | Wt 169.8 lb

## 2015-08-22 DIAGNOSIS — R109 Unspecified abdominal pain: Secondary | ICD-10-CM | POA: Insufficient documentation

## 2015-08-22 DIAGNOSIS — R1084 Generalized abdominal pain: Secondary | ICD-10-CM | POA: Diagnosis not present

## 2015-08-22 DIAGNOSIS — R197 Diarrhea, unspecified: Secondary | ICD-10-CM | POA: Diagnosis not present

## 2015-08-22 MED ORDER — DICYCLOMINE HCL 10 MG PO CAPS: 10.0000 mg | ORAL_CAPSULE | Freq: Three times a day (TID) | ORAL | Status: DC

## 2015-08-22 MED ORDER — PEG-KCL-NACL-NASULF-NA ASC-C 100 G PO SOLR: 1.0000 | ORAL | Status: DC

## 2015-08-22 NOTE — Assessment & Plan Note (Signed)
Patient with new onset of loose stools. Past 4-5 months. Typically presents in an irritable bowel fashion as noted above. Started with 2-3 stools a day and is now increased to up to 4-5 stools a day. They are typically loose and watery, associated with some abdominal discomfort which relieves typically after bowel movement. Given the change in bowel habits, although likely irritable bowel syndrome diarrhea type, we will proceed with colonoscopy because he is 50 years old and has not had one prior. Return for follow-up in 2 months. Bentyl as noted above.

## 2015-08-22 NOTE — Progress Notes (Signed)
cc'ed to pcp °

## 2015-08-22 NOTE — Patient Instructions (Signed)
1. We will schedule your procedure for you. 2. I sent a prescription to your pharmacy for Bentyl 10 mg. Take this before meals and at bedtime. If you become constipated hold the medication and let us know. 3. We will have you return for follow-up in 2 months, or based on postprocedure recommendations. 4. Call us if any severe or worsening symptoms.

## 2015-08-22 NOTE — Progress Notes (Signed)
Primary Care Physician:  Wardell Honour, MD Primary Gastroenterologist:  Dr. Oneida Alar  Chief Complaint  Patient presents with  . Abdominal Pain    HPI:   Seth Mcmillan is a 50 y.o. male who presents On referral from primary care for abdominal pain. PCP notes reviewed. Versed all primary care related to this problem on 08/01/2015 at which point he noted a 3-4 month history of diarrhea and increased bowel movements along with generalized abdominal pain after eating. Denied blood in his stool and weight loss. Has been taking Imodium over-the-counter. Recommended holding dairy products, stopping nonessential medicines including multiple supplements he was taking. Follow-up appointment 08/16/2015 noted continued diarrhea, 4 stools a day. Previous recommendations made no difference in his symptoms, ultrasound completed looking for gallstones abdominal pathology which was negative. Of note, he is 50 years old and is never had a colonoscopy.  Today he states his symptoms are about the same. Tend to be intermittent, will occasionally have a 1-2 day break in his symptoms. His symptoms started 4-5 months ago, no previous problems like this. Denies significant abdominal pain, "more like an ache/nauseated feeling." Sometimes this feeling improves after a bowel movement. Denies hematochezia, melena, vomiting. Does note symptoms postprandially, but also not associated with eating. Rare GERD symptoms, diet dependant but not particularly bothersome, self-resolves. Started on Zantac yesterday per PCP. Denies chest pain, dyspnea, dizziness, lightheadedness, syncope, near syncope. Denies any other upper or lower GI symptoms.  States he's never had a colonoscopy.  Past Medical History  Diagnosis Date  . Hyperlipidemia   . Hypertension     Past Surgical History  Procedure Laterality Date  . Tonsilectomy, adenoidectomy, bilateral myringotomy and tubes    . Shoulder arthroscopy with biceps tendon repair Right  2011  . Knee surgery Right 1997    bursa sac    Current Outpatient Prescriptions  Medication Sig Dispense Refill  . amLODipine (NORVASC) 5 MG tablet Take 1 tablet (5 mg total) by mouth daily. 90 tablet 0  . ranitidine (ZANTAC) 150 MG tablet Take 1 tablet (150 mg total) by mouth 2 (two) times daily. 60 tablet 2  . rosuvastatin (CRESTOR) 10 MG tablet Take 1 tablet (10 mg total) by mouth daily. 90 tablet 1   No current facility-administered medications for this visit.    Allergies as of 08/22/2015  . (No Known Allergies)    Family History  Problem Relation Age of Onset  . Heart disease Mother   . Heart disease Father   . Diabetes Brother   . Colon cancer Neg Hx     Social History   Social History  . Marital Status: Legally Separated    Spouse Name: N/A  . Number of Children: N/A  . Years of Education: N/A   Occupational History  . Not on file.   Social History Main Topics  . Smoking status: Current Every Day Smoker -- 0.50 packs/day for 28 years    Types: Cigarettes  . Smokeless tobacco: Never Used  . Alcohol Use: 7.2 oz/week    12 Cans of beer per week     Comment: 12 cans of beer/week, mostly on weekends.  . Drug Use: No  . Sexual Activity: Not on file   Other Topics Concern  . Not on file   Social History Narrative    Review of Systems: 10-point ROS negative except as per HPI.    Physical Exam: BP 149/96 mmHg  Pulse 62  Temp(Src) 97.1 F (36.2 C)  Ht 5\' 11"  (1.803 m)  Wt 169 lb 12.8 oz (77.021 kg)  BMI 23.69 kg/m2 General:   Alert and oriented. Pleasant and cooperative. Well-nourished and well-developed.  Head:  Normocephalic and atraumatic. Eyes:  Without icterus, sclera clear and conjunctiva pink.  Ears:  Normal auditory acuity. Cardiovascular:  S1, S2 present without murmurs appreciated. Extremities without clubbing or edema. Respiratory:  Clear to auscultation bilaterally. No wheezes, rales, or rhonchi. No distress.  Gastrointestinal:  +BS,  soft, non-tender and non-distended. No HSM noted. No guarding or rebound. No masses appreciated.  Rectal:  Deferred  Musculoskalatal:  Symmetrical without gross deformities. Neurologic:  Alert and oriented x4;  grossly normal neurologically. Psych:  Alert and cooperative. Normal mood and affect. Heme/Lymph/Immune: No excessive bruising noted.    08/22/2015 8:34 AM   Disclaimer: This note was dictated with voice recognition software. Similar sounding words can inadvertently be transcribed and may not be corrected upon review.

## 2015-08-22 NOTE — Assessment & Plan Note (Addendum)
3-4 month history of abdominal pain, generalized. His description he denies its really like a significant pain and more like an nausea subset stomach type feeling. It does improve typically after a bowel movement. I agree with primary care this presents very much like irritable bowel syndrome. However, given his age we'll proceed with a colonoscopy as he is never had one in order to rule out more insidious pathology. In the meantime we'll start him on Bentyl to see if we can give him some symptomatic relief while waiting for the procedure. We will have him follow-up in 2 months or based on post procedure recommendations or symptom reevaluation and possibly medication changes as needed. He still has a gallbladder and may be a very good candidate for Viberzi.  Proceed with colonoscopy with Dr. Oneida Alar in the near future. The risks, benefits, and alternatives have been discussed in detail with the patient. They state understanding and desire to proceed.   The patient is not on any anticoagulants, anxiolytics, chronic pain medications, or antidepressants. Conscious sedation should be adequate for his procedure.

## 2015-09-05 ENCOUNTER — Encounter (HOSPITAL_COMMUNITY): Payer: Self-pay | Admitting: *Deleted

## 2015-09-05 ENCOUNTER — Ambulatory Visit (HOSPITAL_COMMUNITY)
Admission: RE | Admit: 2015-09-05 | Discharge: 2015-09-05 | Disposition: A | Discharge status: Home / Self Care | Payer: Commercial Managed Care - HMO | Source: Ambulatory Visit | Attending: Gastroenterology | Admitting: Gastroenterology

## 2015-09-05 ENCOUNTER — Encounter (HOSPITAL_COMMUNITY)
Admission: RE | Disposition: A | Discharge status: Home / Self Care | Payer: Self-pay | Source: Ambulatory Visit | Attending: Gastroenterology

## 2015-09-05 DIAGNOSIS — D123 Benign neoplasm of transverse colon: Secondary | ICD-10-CM | POA: Insufficient documentation

## 2015-09-05 DIAGNOSIS — R197 Diarrhea, unspecified: Secondary | ICD-10-CM | POA: Insufficient documentation

## 2015-09-05 DIAGNOSIS — D125 Benign neoplasm of sigmoid colon: Secondary | ICD-10-CM | POA: Diagnosis not present

## 2015-09-05 DIAGNOSIS — D124 Benign neoplasm of descending colon: Secondary | ICD-10-CM

## 2015-09-05 DIAGNOSIS — R110 Nausea: Secondary | ICD-10-CM | POA: Diagnosis not present

## 2015-09-05 DIAGNOSIS — R109 Unspecified abdominal pain: Secondary | ICD-10-CM | POA: Insufficient documentation

## 2015-09-05 DIAGNOSIS — Z79899 Other long term (current) drug therapy: Secondary | ICD-10-CM | POA: Diagnosis not present

## 2015-09-05 DIAGNOSIS — I10 Essential (primary) hypertension: Secondary | ICD-10-CM | POA: Diagnosis not present

## 2015-09-05 DIAGNOSIS — F17210 Nicotine dependence, cigarettes, uncomplicated: Secondary | ICD-10-CM | POA: Insufficient documentation

## 2015-09-05 DIAGNOSIS — E785 Hyperlipidemia, unspecified: Secondary | ICD-10-CM | POA: Insufficient documentation

## 2015-09-05 DIAGNOSIS — R1084 Generalized abdominal pain: Secondary | ICD-10-CM

## 2015-09-05 HISTORY — PX: BIOPSY: SHX5522

## 2015-09-05 HISTORY — PX: COLONOSCOPY: SHX5424

## 2015-09-05 HISTORY — PX: POLYPECTOMY: SHX5525

## 2015-09-05 SURGERY — COLONOSCOPY
Anesthesia: Moderate Sedation

## 2015-09-05 MED ORDER — DICYCLOMINE HCL 10 MG PO CAPS: ORAL_CAPSULE | ORAL | Status: DC

## 2015-09-05 MED ORDER — MIDAZOLAM HCL 5 MG/5ML IJ SOLN
INTRAMUSCULAR | Status: AC
  Filled 2015-09-05: qty 10

## 2015-09-05 MED ORDER — STERILE WATER FOR IRRIGATION IR SOLN
Status: DC | PRN
  Administered 2015-09-05: 15:00:00

## 2015-09-05 MED ORDER — SODIUM CHLORIDE 0.9 % IV SOLN
INTRAVENOUS | Status: DC
  Administered 2015-09-05: 14:00:00 via INTRAVENOUS

## 2015-09-05 MED ORDER — MEPERIDINE HCL 100 MG/ML IJ SOLN
INTRAMUSCULAR | Status: AC
  Filled 2015-09-05: qty 2

## 2015-09-05 MED ORDER — MEPERIDINE HCL 100 MG/ML IJ SOLN
INTRAMUSCULAR | Status: DC | PRN
  Administered 2015-09-05: 50 mg via INTRAVENOUS
  Administered 2015-09-05 (×2): 25 mg via INTRAVENOUS

## 2015-09-05 MED ORDER — DICYCLOMINE HCL 20 MG PO TABS: ORAL_TABLET | ORAL | Status: DC

## 2015-09-05 MED ORDER — MIDAZOLAM HCL 5 MG/5ML IJ SOLN
INTRAMUSCULAR | Status: DC | PRN
  Administered 2015-09-05 (×3): 2 mg via INTRAVENOUS

## 2015-09-05 NOTE — Discharge Instructions (Signed)
You had 4 polyps removed. You have internal hemorrhoids.   DRINK WATER TO KEEP YOUR URINE LIGHT YELLOW.  FOLLOW A HIGH FIBER DIET. AVOID ITEMS THAT CAUSE BLOATING & GAS. SEE INFO BELOW.  TAKE A PROBIOTIC DAILY FOR THREE MONTHS ([employer] BRAND, [place]).   YOUR BIOPSY RESULTS WILL BE AVAILABLE IN MY CHART JUN 23 AND MY OFFICE WILL CONTACT YOU IN 10-14 DAYS WITH YOUR RESULTS.   TAKE DICYCLOMINE 10 MG TABLETS(two) or 20 mg (ONE) 30 MINUTES PRIOR MEALS UP TO THREE TIMES A DAY AND AT BEDTIME. IT MAY CAUSE DROWSINESS, DRY EYES/MOUTH, BLURRY VISION, OR DIFFICULTY URINATING.   FOLLOW UP IN 4 MOS.    Next colonoscopy in 3-5 years.   Colonoscopy Care After Read the instructions outlined below and refer to this sheet in the next week. These discharge instructions provide you with general information on caring for yourself after you leave the hospital. While your treatment has been planned according to the most current medical practices available, unavoidable complications occasionally occur. If you have any problems or questions after discharge, call DR. Melquan Ernsberger, [phone].  ACTIVITY  You may resume your regular activity, but move at a slower pace for the next 24 hours.   Take frequent rest periods for the next 24 hours.   Walking will help get rid of the air and reduce the bloated feeling in your belly (abdomen).   No driving for 24 hours (because of the medicine (anesthesia) used during the test).   You may shower.   Do not sign any important legal documents or operate any machinery for 24 hours (because of the anesthesia used during the test).    NUTRITION  Drink plenty of fluids.   You may resume your normal diet as instructed by your doctor.   Begin with a light meal and progress to your normal diet. Heavy or fried foods are harder to digest and may make you feel sick to your stomach (nauseated).   Avoid alcoholic beverages for 24 hours or as  instructed.    MEDICATIONS  You may resume your normal medications.   WHAT YOU CAN EXPECT TODAY  Some feelings of bloating in the abdomen.   Passage of more gas than usual.   Spotting of blood in your stool or on the toilet paper  .  IF YOU HAD POLYPS REMOVED DURING THE COLONOSCOPY:  Eat a soft diet IF YOU HAVE NAUSEA, BLOATING, ABDOMINAL PAIN, OR VOMITING.    FINDING OUT THE RESULTS OF YOUR TEST Not all test results are available during your visit. DR. Oneida Alar WILL CALL YOU WITHIN 14 DAYS OF YOUR PROCEDUE WITH YOUR RESULTS. Do not assume everything is normal if you have not heard from DR. Ladean Steinmeyer, CALL HER OFFICE AT [phone].  SEEK IMMEDIATE MEDICAL ATTENTION AND CALL THE OFFICE: [phone] IF:  You have more than a spotting of blood in your stool.   Your belly is swollen (abdominal distention).   You are nauseated or vomiting.   You have a temperature over 101F.   You have abdominal pain or discomfort that is severe or gets worse throughout the day.   High-Fiber Diet A high-fiber diet changes your normal diet to include more whole grains, legumes, fruits, and vegetables. Changes in the diet involve replacing refined carbohydrates with unrefined foods. The calorie level of the diet is essentially unchanged. The Dietary Reference Intake (recommended amount) for adult males is 38 grams per day. For adult females, it is 25 grams per  day. Pregnant and lactating women should consume 28 grams of fiber per day. Fiber is the intact part of a plant that is not broken down during digestion. Functional fiber is fiber that has been isolated from the plant to provide a beneficial effect in the body. PURPOSE  Increase stool bulk.   Ease and regulate bowel movements.   Lower cholesterol.  REDUCE RISK OF COLON CANCER  INDICATIONS THAT YOU NEED MORE FIBER  Constipation and hemorrhoids.   Uncomplicated diverticulosis (intestine condition) and irritable bowel syndrome.    Weight management.   As a protective measure against hardening of the arteries (atherosclerosis), diabetes, and cancer.   GUIDELINES FOR INCREASING FIBER IN THE DIET  Start adding fiber to the diet slowly. A gradual increase of about 5 more grams (2 slices of whole-wheat bread, 2 servings of most fruits or vegetables, or 1 bowl of high-fiber cereal) per day is best. Too rapid an increase in fiber may result in constipation, flatulence, and bloating.   Drink enough water and fluids to keep your urine clear or pale yellow. Water, juice, or caffeine-free drinks are recommended. Not drinking enough fluid may cause constipation.   Eat a variety of high-fiber foods rather than one type of fiber.   Try to increase your intake of fiber through using high-fiber foods rather than fiber pills or supplements that contain small amounts of fiber.   The goal is to change the types of food eaten. Do not supplement your present diet with high-fiber foods, but replace foods in your present diet.   INCLUDE A VARIETY OF FIBER SOURCES  Replace refined and processed grains with whole grains, canned fruits with fresh fruits, and incorporate other fiber sources. White rice, white breads, and most bakery goods contain little or no fiber.   Brown whole-grain rice, buckwheat oats, and many fruits and vegetables are all good sources of fiber. These include: broccoli, Brussels sprouts, cabbage, cauliflower, beets, sweet potatoes, white potatoes (skin on), carrots, tomatoes, eggplant, squash, berries, fresh fruits, and dried fruits.   Cereals appear to be the richest source of fiber. Cereal fiber is found in whole grains and bran. Bran is the fiber-rich outer coat of cereal grain, which is largely removed in refining. In whole-grain cereals, the bran remains. In breakfast cereals, the largest amount of fiber is found in those with "bran" in their names. The fiber content is sometimes indicated on the label.   You may  need to include additional fruits and vegetables each day.   In baking, for 1 cup white flour, you may use the following substitutions:   1 cup whole-wheat flour minus 2 tablespoons.   1/2 cup white flour plus 1/2 cup whole-wheat flour.   Polyps, Colon  A polyp is extra tissue that grows inside your body. Colon polyps grow in the large intestine. The large intestine, also called the colon, is part of your digestive system. It is a long, hollow tube at the end of your digestive tract where your body makes and stores stool. Most polyps are not dangerous. They are benign. This means they are not cancerous. But over time, some types of polyps can turn into cancer. Polyps that are smaller than a pea are usually not harmful. But larger polyps could someday become or may already be cancerous. To be safe, doctors remove all polyps and test them.   PREVENTION There is not one sure way to prevent polyps. You might be able to lower your risk of getting them if  you:  Eat more fruits and vegetables and less fatty food.   Do not smoke.   Avoid alcohol.   Exercise every day.   Lose weight if you are overweight.   Eating more calcium and folate can also lower your risk of getting polyps. Some foods that are rich in calcium are milk, cheese, and broccoli. Some foods that are rich in folate are chickpeas, kidney beans, and spinach.   Hemorrhoids Hemorrhoids are dilated (enlarged) veins around the rectum. Sometimes clots will form in the veins. This makes them swollen and painful. These are called thrombosed hemorrhoids. Causes of hemorrhoids include:  Constipation.   Straining to have a bowel movement.   HEAVY LIFTING  HOME CARE INSTRUCTIONS  Eat a well balanced diet and drink 6 to 8 glasses of water every day to avoid constipation. You may also use a bulk laxative.   Avoid straining to have bowel movements.   Keep anal area dry and clean.   Do not use a donut shaped pillow or sit on the  toilet for long periods. This increases blood pooling and pain.   Move your bowels when your body has the urge; this will require less straining and will decrease pain and pressure.

## 2015-09-05 NOTE — H&P (Addendum)
  Primary Care Physician:  Wardell Honour, MD Primary Gastroenterologist:  Dr. Oneida Alar  Pre-Procedure History & Physical: HPI:  Seth Mcmillan is a 50 y.o. male here for Diarrhea/abdominal pain. ATE STEAK AND POTATOES AT 2 PM YESTERDAY.  Past Medical History  Diagnosis Date  . Hyperlipidemia   . Hypertension     Past Surgical History  Procedure Laterality Date  . Tonsilectomy, adenoidectomy, bilateral myringotomy and tubes    . Shoulder arthroscopy with biceps tendon repair Right 2011  . Knee surgery Right 1997    bursa sac    Prior to Admission medications   Medication Sig Start Date End Date Taking? Authorizing Provider  amLODipine (NORVASC) 5 MG tablet Take 1 tablet (5 mg total) by mouth daily. 04/07/15  Yes Wardell Honour, MD  dicyclomine (BENTYL) 10 MG capsule Take 1 capsule (10 mg total) by mouth 4 (four) times daily -  before meals and at bedtime. 08/22/15  Yes Carlis Stable, NP  peg 3350 powder (MOVIPREP) 100 g SOLR Take 1 kit (200 g total) by mouth as directed. 08/22/15  Yes Danie Binder, MD  ranitidine (ZANTAC) 150 MG tablet Take 1 tablet (150 mg total) by mouth 2 (two) times daily. 08/16/15  Yes Wardell Honour, MD  rosuvastatin (CRESTOR) 10 MG tablet Take 1 tablet (10 mg total) by mouth daily. 04/27/14  Yes Wardell Honour, MD    Allergies as of 08/22/2015  . (No Known Allergies)    Family History  Problem Relation Age of Onset  . Heart disease Mother   . Heart disease Father   . Diabetes Brother   . Colon cancer Neg Hx     Social History   Social History  . Marital Status: Divorced    Spouse Name: N/A  . Number of Children: N/A  . Years of Education: N/A   Occupational History  . Not on file.   Social History Main Topics  . Smoking status: Current Every Day Smoker -- 1.00 packs/day for 25 years    Types: Cigarettes  . Smokeless tobacco: Never Used  . Alcohol Use: 7.2 oz/week    12 Cans of beer per week     Comment: 12 cans of beer/week, mostly on  weekends.  . Drug Use: No  . Sexual Activity: Not on file   Other Topics Concern  . Not on file   Social History Narrative    Review of Systems: See HPI, otherwise negative ROS   Physical Exam: BP 146/99 mmHg  Pulse 79  Temp(Src) 98.7 F (37.1 C) (Oral)  Resp 9  Ht _0  (1.803 m)  Wt 169 lb (76.658 kg)  BMI 23.58 kg/m2  SpO2 98% General:   Alert,  pleasant and cooperative in NAD Head:  Normocephalic and atraumatic. Neck:  Supple; Lungs:  Clear throughout to auscultation.    Heart:  Regular rate and rhythm. Abdomen:  Soft, nontender and nondistended. Normal bowel sounds, without guarding, and without rebound.   Neurologic:  Alert and  oriented x4;  grossly normal neurologically.  Impression/Plan:    Diarrhea/abdominal pain  PLAN: TCS TODAY WITH BIOPSY

## 2015-09-05 NOTE — Op Note (Addendum)
[facility] Patient Name: Seth Mcmillan Procedure Date: 09/05/2015 2:37 PM MRN: [id number] Date of Birth: 23-Apr-1965 Attending MD: Barney Drain , MD CSN: [id number] Age: 50 Admit Type: Outpatient Procedure:                Colonoscopy with COLD FORCEPS/SNARE POLYPECTOMY Indications:              Periumbilical abdominal pain, Clinically                            significant diarrhea of unexplained origin. NAUSEA.                            SYMPTOMS BETTER WITH DICYCLOMINE 10 MG QACHS. Providers:                Barney Drain, MD, Jeanann Lewandowsky. Sharon Seller, RN, Randa Spike, Technician Referring MD:             Lillette Boxer. Miller Medicines:                Meperidine 100 mg IV, Midazolam 6 mg IV Complications:            No immediate complications. Estimated Blood Loss:     Estimated blood loss was minimal. Procedure:                Pre-Anesthesia Assessment:                           - Prior to the procedure, a History and Physical                            was performed, and patient medications and                            allergies were reviewed. The patient's tolerance of                            previous anesthesia was also reviewed. The risks                            and benefits of the procedure and the sedation                            options and risks were discussed with the patient.                            All questions were answered, and informed consent                            was obtained. Prior Anticoagulants: The patient has                            taken no previous anticoagulant or antiplatelet  agents. ASA Grade Assessment: II - A patient with                            mild systemic disease. After reviewing the risks                            and benefits, the patient was deemed in                            satisfactory condition to undergo the procedure.                           After obtaining  informed consent, the colonoscope                            was passed under direct vision. Throughout the                            procedure, the patient's blood pressure, pulse, and                            oxygen saturations were monitored continuously. The                            EC-[id number]) scope was introduced through                            the anus and advanced to the 10 cm into the ileum.                            The terminal ileum, ileocecal valve, appendiceal                            orifice, and rectum were photographed. The                            colonoscopy was somewhat difficult due to a                            tortuous colon. The patient tolerated the procedure                            well. The quality of the bowel preparation was                            excellent. Scope In: 2:52:41 PM Scope Out: 3:12:12 PM Scope Withdrawal Time: 0 hours 17 minutes 7 seconds  Total Procedure Duration: 0 hours 19 minutes 31 seconds  Findings:      The digital rectal exam was normal.      A 6 mm polyp was found in the sigmoid colon. The polyp was sessile. The       polyp was removed with a hot snare. Resection and retrieval were       complete.  Three sessile polyps were found in the descending colon and transverse       colon. The polyps were 3 to 5 mm in size. These polyps were removed with       a cold biopsy forceps. Resection and retrieval were complete.      The terminal ileum appeared normal.      The recto-sigmoid colon was moderately redundant. Advancing the scope       required straightening and shortening the scope to obtain bowel loop       reduction. This was biopsied with a cold forceps for evaluation of       microscopic colitis. Impression:               - FOUR POLYPS REMOVED                           - ABDOMINAL PAIN AND DIARRHEA MOST LIKELY DUE TO                            IBS-D AND/OR LACTOSE INTOLERANCE Moderate Sedation:       Moderate (conscious) sedation was administered by the endoscopy nurse       and supervised by the endoscopist. The following parameters were       monitored: oxygen saturation, heart rate, blood pressure, and response       to care. Total physician intraservice time was 34 minutes. Recommendation:           - High fiber diet and lactose free diet.                           - Use Bentyl (dicyclomine) 20 mg PO QID 30 min AC.                           - Await pathology results.                           - Repeat colonoscopy in 3 - 5 years for                            surveillance.                           - Return to GI office in 4 months.                           DRINK WATER TO KEEP YOUR URINE LIGHT YELLOW.                           FOLLOW A HIGH FIBER DIET. AVOID ITEMS THAT CAUSE                            BLOATING & GAS. SEE INFO BELOW.                           TAKE A PROBIOTIC DAILY FOR THREE MONTHS ([employer]  BRAND, [place]).                           TAKE DICYCLOMINE 10 MG TABLETS(two) or 20 mg (ONE)                            30 MINUTES PRIOR MEALS UP TO THREE TIMES A DAY AND                            AT BEDTIME. IT MAY CAUSE DROWSINESS, DRY                            EYES/MOUTH, BLURRY VISION, OR DIFFICULTY URINATING.                           - Patient has a contact number available for                            emergencies. The signs and symptoms of potential                            delayed complications were discussed with the                            patient. Return to normal activities tomorrow.                            Written discharge instructions were provided to the                            patient.                           - Post procedure medication instructions were                            provided to the patient. Procedure Code(s):        --- Professional ---                           [phone],  Colonoscopy, flexible; with removal of                            tumor(s), polyp(s), or other lesion(s) by snare                            technique                           [id number], 59, Colonoscopy, flexible; with biopsy,                            single or multiple                           99153,  Moderate sedation services; each additional                            15 minutes intraservice time                           G0500, Moderate sedation services provided by the                            same physician or other qualified health care                            professional performing a gastrointestinal                            endoscopic service that sedation supports,                            requiring the presence of an independent trained                            observer to assist in the monitoring of the                            patient's level of consciousness and physiological                            status; initial 15 minutes of intra-service time;                            patient age 76 years or older (additional time may                            be reported with [phone], as appropriate) Diagnosis Code(s):        --- Professional ---                           D12.5, Benign neoplasm of sigmoid colon                           D12.4, Benign neoplasm of descending colon                           D12.3, Benign neoplasm of transverse colon (hepatic                            flexure or splenic flexure)                           A999333, Periumbilical pain                           R19.7, Diarrhea, unspecified                           Q43.8, Other specified congenital malformations  of                            intestine CPT copyright 2016 [employer]. All rights reserved. The codes documented in this report are preliminary and upon coder review may  be revised to meet current compliance requirements. Barney Drain, MD Barney Drain,  MD 09/05/2015 5:00:11 PM This report has been signed electronically. Number of Addenda: 0

## 2015-09-05 NOTE — Progress Notes (Signed)
REVIEWED-NO ADDITIONAL RECOMMENDATIONS. 

## 2015-09-07 ENCOUNTER — Encounter (HOSPITAL_COMMUNITY): Payer: Self-pay | Admitting: Gastroenterology

## 2015-09-25 ENCOUNTER — Telehealth: Payer: Self-pay | Admitting: Gastroenterology

## 2015-09-25 NOTE — Telephone Encounter (Signed)
Please call pt. HE had simple adenomas removed from HIS colon. HIS COLON BIOPSIES ARE NORMAL.  Marland KitchenDRINK WATER TO KEEP YOUR URINE LIGHT YELLOW.  FOLLOW A HIGH FIBER DIET. AVOID ITEMS THAT CAUSE BLOATING & GAS. SEE INFO BELOW.  TAKE A PROBIOTIC DAILY FOR THREE MONTHS ([employer] BRAND, [place]).   TAKE DICYCLOMINE 10 MG TABLETS(two) or 20 mg (ONE) 30 MINUTES PRIOR MEALS UP TO THREE TIMES A DAY AND AT BEDTIME. IT MAY CAUSE DROWSINESS, DRY EYES/MOUTH, BLURRY VISION, OR DIFFICULTY URINATING.  FOLLOW UP IN 4 MOS E30 ABDOMINAL PAIN, DIARRHEA NEXT TCS IN 3 YEARS.

## 2015-09-25 NOTE — Telephone Encounter (Signed)
Reminders in epic °

## 2015-09-26 NOTE — Telephone Encounter (Signed)
Pt is aware of results. 

## 2015-10-06 ENCOUNTER — Other Ambulatory Visit: Payer: Self-pay | Admitting: Family Medicine

## 2015-10-23 ENCOUNTER — Ambulatory Visit: Payer: Commercial Managed Care - HMO | Admitting: Nurse Practitioner

## 2015-11-23 ENCOUNTER — Encounter: Payer: Self-pay | Admitting: Nurse Practitioner

## 2015-11-23 ENCOUNTER — Ambulatory Visit (INDEPENDENT_AMBULATORY_CARE_PROVIDER_SITE_OTHER): Payer: Commercial Managed Care - HMO | Admitting: Nurse Practitioner

## 2015-11-23 DIAGNOSIS — K589 Irritable bowel syndrome without diarrhea: Secondary | ICD-10-CM | POA: Diagnosis not present

## 2015-11-23 DIAGNOSIS — Z860100 Personal history of colon polyps, unspecified: Secondary | ICD-10-CM | POA: Insufficient documentation

## 2015-11-23 NOTE — Progress Notes (Signed)
CC'ED TO PCP 

## 2015-11-23 NOTE — Progress Notes (Signed)
Referring Provider: Wardell Honour, MD Primary Care Physician:  Wardell Honour, MD Primary GI:  Dr. Oneida Alar  Chief Complaint  Patient presents with  . Follow-up    doing ok    HPI:   Seth Mcmillan is a 50 y.o. male who presents for follow-up on diarrhea and abdominal pain. Last seen in our office 08/22/2015 at which point his symptoms are about the same with intermittent diarrhea with a 1-2 break occasionally and his symptoms, all of which started 4-5 months prior. He denies significant abdominal pain stated it was more like an ache/nauseated feeling, occasionally improve a bowel movement. No other red flag/warning signs or symptoms. Likely irritable bowel syndrome, set up for colonoscopy due to his age and that he is never had one before. Potential candidate for Viberzi if he is irritable bowel diarrhea type because he still has a gallbladder.  Colonoscopy completed 09/05/2015 which found 6 mm polyp in the sigmoid colon which was sessile, 3 sessile polyps in the descending colon and transverse colon ranging from 3-5 mm in size. Abdominal pain and diarrhea most likely due to IBS D and/or lactose intolerance. Surgical pathology found the polyps to be tubular adenoma. Random colon biopsies found to be benign colonic mucosa. Recommended high fiber and lactose free diet, Bentyl 20 mg 4 times a day 30 minutes before meals as needed, repeat colonoscopy in 3-5 years for surveillance.  Today he states he's doing well overall. Significant improvement in diarrhea with Bentyl. Occasional diarrhea, only two stools a day improved from 5-6 a day; stools typically soft, occasionally diarrhea. Nausea resolved, abdominal pain significantly improved. Denies hematochezia, fever, chills, unintentional weight loss. Denies chest pain, dyspnea, dizziness, lightheadedness, syncope, near syncope. Denies any other upper or lower GI symptoms.  Past Medical History:  Diagnosis Date  . Hyperlipidemia   .  Hypertension   . IBS (irritable bowel syndrome)    Diarrhea type    Past Surgical History:  Procedure Laterality Date  . BIOPSY  09/05/2015   Procedure: BIOPSY;  Surgeon: Danie Binder, MD;  [facility]: [facility];  Service: Endoscopy;;  random colon bx  . COLONOSCOPY N/A 09/05/2015   Procedure: COLONOSCOPY;  Surgeon: Danie Binder, MD;  Location: [facility];  Service: Endoscopy;  Laterality: N/A;  245  . KNEE SURGERY Right 1997   bursa sac  . POLYPECTOMY  09/05/2015   Procedure: POLYPECTOMY;  Surgeon: Danie Binder, MD;  Location: [facility];  Service: Endoscopy;;  colon polyps  . SHOULDER ARTHROSCOPY WITH BICEPS TENDON REPAIR Right 2011  . TONSILECTOMY, ADENOIDECTOMY, BILATERAL MYRINGOTOMY AND TUBES      Current Outpatient Prescriptions  Medication Sig Dispense Refill  . amLODipine (NORVASC) 5 MG tablet TAKE ONE TABLET BY MOUTH ONCE DAILY 90 tablet 1  . dicyclomine (BENTYL) 10 MG capsule 2 pills 30 mins before meals and at bedtime to reduce abdominal pain and diarrhea. 120 capsule 0  . dicyclomine (BENTYL) 20 MG tablet 1 po qachs to reduce diarrhea and abdominal pain 120 tablet 11  . rosuvastatin (CRESTOR) 10 MG tablet Take 1 tablet (10 mg total) by mouth daily. 90 tablet 1   No current facility-administered medications for this visit.     Allergies as of 11/23/2015  . (No Known Allergies)    Family History  Problem Relation Age of Onset  . Heart disease Mother   . Heart disease Father   . Diabetes Brother   . Colon cancer Neg Hx  Social History   Social History  . Marital status: Divorced    Spouse name: N/A  . Number of children: N/A  . Years of education: N/A   Social History Main Topics  . Smoking status: Current Every Day Smoker    Packs/day: 1.00    Years: 25.00    Types: Cigarettes  . Smokeless tobacco: Never Used  . Alcohol use 7.2 oz/week    12 Cans of beer per week     Comment: 12 cans of beer/week, mostly on weekends.  . Drug use: No    . Sexual activity: Not Asked   Other Topics Concern  . None   Social History Narrative  . None    Review of Systems: 10-point ROS negative except as per HPI.  Physical Exam: BP (!) 135/93   Pulse 73   Temp 97.5 F (36.4 C) (Oral)   Ht 5\' 11"  (1.803 m)   Wt 166 lb (75.3 kg)   BMI 23.15 kg/m  General:   Alert and oriented. Pleasant and cooperative. Well-nourished and well-developed.  Ears:  Normal auditory acuity. Cardiovascular:  S1, S2 present without murmurs appreciated. Extremities without clubbing or edema. Respiratory:  Clear to auscultation bilaterally. No wheezes, rales, or rhonchi. No distress.  Gastrointestinal:  +BS, soft, non-tender and non-distended. No HSM noted. No guarding or rebound. No masses appreciated.  Rectal:  Deferred  Musculoskalatal:  Symmetrical without gross deformities. Neurologic:  Alert and oriented x4;  grossly normal neurologically. Psych:  Alert and cooperative. Normal mood and affect. Heme/Lymph/Immune: No excessive bruising noted.    11/23/2015 11:49 AM   Disclaimer: This note was dictated with voice recognition software. Similar sounding words can inadvertently be transcribed and may not be corrected upon review.

## 2015-11-23 NOTE — Assessment & Plan Note (Signed)
Diarrhea and nausea likely due to irritable bowel syndrome, diarrhea subtype. Patient has significantly improved on Bentyl. Recommended continue Bentyl, return for follow-up in 6 months for long-term symptom progression. He can call us before then if he has any worsening symptoms or recurrent symptoms. If Bentyl stops working at some point he should be a candidate for Viberzi because he saw the gallbladder.

## 2015-11-23 NOTE — Patient Instructions (Signed)
1. Continue taking Bentyl 2. Return for follow-up in 6 months 3. Call us if you have questions or problems before then 4. You are on the recall list to repeat your colonoscopy in 3 years and we will send you a letter at that time.

## 2015-11-23 NOTE — Assessment & Plan Note (Signed)
First-ever colonoscopy completed age 50 which found 4 sessile polyps ranging from 3-6 mm which were removed and found to be tubular adenoma. Recommend 3 year repeat exam for which she is on the recall list. The patient is in agreement to 3 year repeat colonoscopy "a platelet safe." Call if any recurrent symptoms, return for follow-up in 6 months.

## 2016-05-22 ENCOUNTER — Encounter: Payer: Self-pay | Admitting: Nurse Practitioner

## 2016-05-22 ENCOUNTER — Ambulatory Visit (INDEPENDENT_AMBULATORY_CARE_PROVIDER_SITE_OTHER): Payer: Commercial Managed Care - HMO | Admitting: Nurse Practitioner

## 2016-05-22 VITALS — BP 148/97 | HR 76 | Temp 97.8°F | Ht 71.0 in | Wt 178.4 lb

## 2016-05-22 DIAGNOSIS — R1084 Generalized abdominal pain: Secondary | ICD-10-CM

## 2016-05-22 DIAGNOSIS — K580 Irritable bowel syndrome with diarrhea: Secondary | ICD-10-CM | POA: Diagnosis not present

## 2016-05-22 NOTE — Progress Notes (Signed)
Referring Provider: Wardell Honour, MD Primary Care Physician:  Wardell Honour, MD Primary GI:  Dr. Oneida Alar  Chief Complaint  Patient presents with  . Irritable Bowel Syndrome    HPI:   Seth Mcmillan is a 51 y.o. male who presents for follow-up on irritable bowel syndrome diarrhea type. He was last seen in our office 11/23/2015 at which point he noted doing well overall on Bentyl with significant improvement in diarrhea. Occasional diarrhea, only 2 stools a day compared to a previous frequency of 5-6 a day. Stools are soft and occasionally diarrhea. Nausea resolved and abdominal pain significantly improved. No other symptoms.  Colonoscopy completed 09/05/2015 which found 6 mm polyp in the sigmoid colon which was sessile, 3 sessile polyps in the descending colon and transverse colon ranging from 3-5 mm in size. Abdominal pain and diarrhea most likely due to IBS D and/or lactose intolerance. Surgical pathology found the polyps to be tubular adenoma. Random colon biopsies found to be benign colonic mucosa. Recommended high fiber and lactose free diet, Bentyl 20 mg 4 times a day 30 minutes before meals as needed, repeat colonoscopy in 3-5 years for surveillance. He is on surveillance list for this.  Today he states he's doing well overall. He is having diarrhea/real soft stools 2-3 times a day, majority of stools are diarrhea. Urgency as well. Denies hematochezia, melena. Improved abdominal pain, but some recurrences noted (such as yesterday) which improves with bowel movement. Denies fever, chills, unintentional weight loss, acute changes in bowel habits. Stress level is average, but does have a brother who is actively dying which preoccupies the patient a bit. Denies chest pain, dyspnea, dizziness, lightheadedness, syncope, near syncope. Denies any other upper or lower GI symptoms.  Has NOT had a cholecystectomy before.  Past Medical History:  Diagnosis Date  . Hyperlipidemia   .  Hypertension   . IBS (irritable bowel syndrome)    Diarrhea type    Past Surgical History:  Procedure Laterality Date  . BIOPSY  09/05/2015   Procedure: BIOPSY;  Surgeon: Danie Binder, MD;  Location: [facility];  Service: Endoscopy;;  random colon bx  . COLONOSCOPY N/A 09/05/2015   Procedure: COLONOSCOPY;  Surgeon: Danie Binder, MD;  Location: [facility];  Service: Endoscopy;  Laterality: N/A;  245  . KNEE SURGERY Right 1997   bursa sac  . POLYPECTOMY  09/05/2015   Procedure: POLYPECTOMY;  Surgeon: Danie Binder, MD;  Location: [facility];  Service: Endoscopy;;  colon polyps  . SHOULDER ARTHROSCOPY WITH BICEPS TENDON REPAIR Right 2011  . TONSILECTOMY, ADENOIDECTOMY, BILATERAL MYRINGOTOMY AND TUBES      Current Outpatient Prescriptions  Medication Sig Dispense Refill  . amLODipine (NORVASC) 5 MG tablet TAKE ONE TABLET BY MOUTH ONCE DAILY 90 tablet 1  . dicyclomine (BENTYL) 20 MG tablet 1 po qachs to reduce diarrhea and abdominal pain 120 tablet 11  . rosuvastatin (CRESTOR) 10 MG tablet Take 1 tablet (10 mg total) by mouth daily. 90 tablet 1   No current facility-administered medications for this visit.     Allergies as of 05/22/2016  . (No Known Allergies)    Family History  Problem Relation Age of Onset  . Heart disease Mother   . Heart disease Father   . Diabetes Brother   . Colon cancer Neg Hx     Social History   Social History  . Marital status: Divorced    Spouse name: N/A  .  Number of children: N/A  . Years of education: N/A   Social History Main Topics  . Smoking status: Current Every Day Smoker    Packs/day: 1.00    Years: 25.00    Types: Cigarettes  . Smokeless tobacco: Never Used  . Alcohol use 7.2 oz/week    12 Cans of beer per week     Comment: 12 cans of beer/week, mostly on weekends.  . Drug use: No  . Sexual activity: Not Asked   Other Topics Concern  . None   Social History Narrative  . None    Review of Systems: Complete  ROS negative except as per HPI.   Physical Exam: BP (!) 148/97   Pulse 76   Temp 97.8 F (36.6 C) (Oral)   Ht 5\' 11"  (1.803 m)   Wt 178 lb 6.4 oz (80.9 kg)   BMI 24.88 kg/m  General:   Alert and oriented. Pleasant and cooperative. Well-nourished and well-developed.  Eyes:  Without icterus, sclera clear and conjunctiva pink.  Ears:  Normal auditory acuity. Cardiovascular:  S1, S2 present without murmurs appreciated. Extremities without clubbing or edema. Respiratory:  Clear to auscultation bilaterally. No wheezes, rales, or rhonchi. No distress.  Gastrointestinal:  +BS, soft, non-tender and non-distended. No HSM noted. No guarding or rebound. No masses appreciated.  Rectal:  Deferred  Musculoskalatal:  Symmetrical without gross deformities. Neurologic:  Alert and oriented x4;  grossly normal neurologically. Psych:  Alert and cooperative. Normal mood and affect. Heme/Lymph/Immune: No excessive bruising noted.    05/22/2016 11:36 AM   Disclaimer: This note was dictated with voice recognition software. Similar sounding words can inadvertently be transcribed and may not be corrected upon review.

## 2016-05-22 NOTE — Progress Notes (Signed)
cc'ed to pcp °

## 2016-05-22 NOTE — Assessment & Plan Note (Signed)
Patient is currently on Bentyl 20 mg 4 times a day. Has had some improvement, but continues with 2-3 soft to loose stools a day and occasional abdominal pain which resolves after bowel movement. At this point, given that he denies having a cholecystectomy, I will trial him on Viberzi 75 mg as a low-dose, twice a day. I will provide samples to last 2 weeks. I have requested that he call with a progress report in 1-2 weeks. Can adjust the dose or make agent change based on response. Return for follow-up in 3 months.

## 2016-05-22 NOTE — Assessment & Plan Note (Signed)
Occasional abdominal pain, likely related to IBS as his symptoms resolve after bowel movement. Further IBS management as per above. Return for follow-up in 3 months.

## 2016-05-22 NOTE — Patient Instructions (Signed)
1. Stop taking Bentyl for now. 2. I am giving you samples of Viberzi 75 mg. Take one tablet, twice a day. 3. Call us in 1-2 weeks and let us know if it is helping. 4. Notify us if you have any side effects, especially severe abdominal pain. 5. Return for follow-up in 3 months.

## 2016-06-10 ENCOUNTER — Telehealth: Payer: Self-pay

## 2016-06-10 MED ORDER — ELUXADOLINE 75 MG PO TABS: 75.0000 mg | ORAL_TABLET | Freq: Two times a day (BID) | ORAL | 3 refills | Status: DC

## 2016-06-10 NOTE — Telephone Encounter (Signed)
Left Vm that he can pick up the prescription and savings card.

## 2016-06-10 NOTE — Telephone Encounter (Signed)
Pt left Vm that Viberzi samples was working well for him and to please send in a prescription for him.

## 2016-06-10 NOTE — Telephone Encounter (Signed)
Patient informed and said he will be by today to pick up Rx and savings card

## 2016-06-10 NOTE — Telephone Encounter (Signed)
Please tell the patient the Rx was printed and signed. He can pick it up from here along with a copay card.

## 2016-07-05 ENCOUNTER — Other Ambulatory Visit: Payer: Self-pay | Admitting: Family Medicine

## 2016-07-05 ENCOUNTER — Telehealth: Payer: Self-pay | Admitting: Family Medicine

## 2016-07-05 MED ORDER — RANITIDINE HCL 150 MG PO TABS: 150.0000 mg | ORAL_TABLET | Freq: Two times a day (BID) | ORAL | 0 refills | Status: DC

## 2016-07-05 NOTE — Telephone Encounter (Signed)
Refill sent to pharmacy and patient aware that he will need to be seen for any further refills

## 2016-07-05 NOTE — Telephone Encounter (Signed)
What is the name of the medication? Ranitidine 150mg   Have you contacted your pharmacy to request a refill? yes  Which pharmacy would you like this sent to? [facility] in [place].   Patient notified that their request is being sent to the clinical staff for review and that they should receive a call once it is complete. If they do not receive a call within 24 hours they can check with their pharmacy or our office.

## 2016-07-08 NOTE — Progress Notes (Signed)
REVIEWED-NO ADDITIONAL RECOMMENDATIONS. 

## 2016-08-14 ENCOUNTER — Ambulatory Visit (INDEPENDENT_AMBULATORY_CARE_PROVIDER_SITE_OTHER): Payer: Commercial Managed Care - HMO | Admitting: Urology

## 2016-08-14 DIAGNOSIS — C61 Malignant neoplasm of prostate: Secondary | ICD-10-CM

## 2016-08-22 ENCOUNTER — Ambulatory Visit (INDEPENDENT_AMBULATORY_CARE_PROVIDER_SITE_OTHER): Payer: Commercial Managed Care - HMO | Admitting: Nurse Practitioner

## 2016-08-22 ENCOUNTER — Encounter: Payer: Self-pay | Admitting: Nurse Practitioner

## 2016-08-22 VITALS — BP 149/96 | HR 73 | Temp 98.5°F | Ht 71.0 in | Wt 184.0 lb

## 2016-08-22 DIAGNOSIS — K580 Irritable bowel syndrome with diarrhea: Secondary | ICD-10-CM

## 2016-08-22 DIAGNOSIS — K219 Gastro-esophageal reflux disease without esophagitis: Secondary | ICD-10-CM | POA: Diagnosis not present

## 2016-08-22 MED ORDER — RANITIDINE HCL 150 MG PO TABS: 150.0000 mg | ORAL_TABLET | Freq: Two times a day (BID) | ORAL | 3 refills | Status: DC

## 2016-08-22 NOTE — Progress Notes (Signed)
cc'ed to pcp °

## 2016-08-22 NOTE — Assessment & Plan Note (Signed)
IBS symptoms much improved with Viberzi. Is having fewer bowel movements, more formed bowel movements. Rare abdominal pain. Some symptomatic breakthrough if he has increase in stress in his life. Recommended continue Viberzi, return for follow-up in 6 months. He is to call us for any worsening or severe symptoms.

## 2016-08-22 NOTE — Patient Instructions (Signed)
1. I have refilled her Zantac. 2. Continue taking Zantac and Viberzi. 3. Call us or have your pharmacy call us when you're Viberzi gets low. 4. Return for follow-up in 6 months. 5. Call us if you have any severe or worsening symptoms before then.

## 2016-08-22 NOTE — Assessment & Plan Note (Signed)
GERD symptoms essentially resolved on Zantac. He is asking for refill. I will send this and today, return for follow-up in 6 months, call for any severe or worsening symptoms.

## 2016-08-22 NOTE — Progress Notes (Signed)
Referring Provider: Wardell Honour, MD Primary Care Physician:  Wardell Honour, MD Primary GI:  Dr. Oneida Alar  Chief Complaint  Patient presents with  . Irritable Bowel Syndrome    f/u, needs refills    HPI:   Seth Mcmillan is a 51 y.o. male who presents for follow-up on irritable bowel syndrome. The patient was last seen in our office 05/22/2016 at which point he was doing well overall, having diarrhea/real soft stools 2-3 times a day with urgency. Abdominal pain improves with diarrhea/bowel movement. No other GI symptoms at that time. Colonoscopy up-to-date on file recommended repeat in 3-5 years for surveillance and he is on the surveillance list for this.  Today he states he's doing well overall. Linzess has helped him significantly. Only have about 2 bowel movements a day and they are more solid. Rare abdominal pain. Only time he has worse symptoms is if he is very stressed. Ranitadine has improved his dyspepsia symptoms significantly. Denies hematochezia, melena, unintentional weight loss, fever, chills. Denies chest pain, dyspnea, dizziness, lightheadedness, syncope, near syncope. Denies any other upper or lower GI symptoms.   Denies any ADEs with Viberzi.  Past Medical History:  Diagnosis Date  . Hyperlipidemia   . Hypertension   . IBS (irritable bowel syndrome)    Diarrhea type    Past Surgical History:  Procedure Laterality Date  . BIOPSY  09/05/2015   Procedure: BIOPSY;  Surgeon: Danie Binder, MD;  [facility];  Service: Endoscopy;;  random colon bx  . COLONOSCOPY N/A 09/05/2015   Procedure: COLONOSCOPY;  Surgeon: Danie Binder, MD;  [facility];  Service: Endoscopy;  Laterality: N/A;  245  . KNEE SURGERY Right 1997   bursa sac  . POLYPECTOMY  09/05/2015   Procedure: POLYPECTOMY;  Surgeon: Danie Binder, MD;  [facility]: [facility];  Service: Endoscopy;;  colon polyps  . SHOULDER ARTHROSCOPY WITH BICEPS TENDON REPAIR Right 2011  .  TONSILECTOMY, ADENOIDECTOMY, BILATERAL MYRINGOTOMY AND TUBES      Current Outpatient Prescriptions  Medication Sig Dispense Refill  . amLODipine (NORVASC) 5 MG tablet TAKE ONE TABLET BY MOUTH ONCE DAILY 90 tablet 1  . Eluxadoline (VIBERZI) 75 MG TABS Take 75 mg by mouth 2 (two) times daily. 180 tablet 3  . ranitidine (ZANTAC) 150 MG tablet TAKE ONE TABLET BY MOUTH TWICE DAILY 60 tablet 0  . ranitidine (ZANTAC) 150 MG tablet Take 1 tablet (150 mg total) by mouth 2 (two) times daily. 60 tablet 0  . rosuvastatin (CRESTOR) 10 MG tablet Take 1 tablet (10 mg total) by mouth daily. 90 tablet 1  . dicyclomine (BENTYL) 20 MG tablet 1 po qachs to reduce diarrhea and abdominal pain (Patient not taking: Reported on 08/22/2016) 120 tablet 11   No current facility-administered medications for this visit.     Allergies as of 08/22/2016  . (No Known Allergies)    Family History  Problem Relation Age of Onset  . Heart disease Mother   . Heart disease Father   . Diabetes Brother   . Colon cancer Neg Hx     Social History   Social History  . Marital status: Divorced    Spouse name: N/A  . Number of children: N/A  . Years of education: N/A   Social History Main Topics  . Smoking status: Current Every Day Smoker    Packs/day: 1.00    Years: 25.00    Types: Cigarettes  . Smokeless  tobacco: Never Used  . Alcohol use 7.2 oz/week    12 Cans of beer per week     Comment: 12 cans of beer/week, mostly on weekends.  . Drug use: No  . Sexual activity: Not Asked   Other Topics Concern  . None   Social History Narrative  . None    Review of Systems: Complete ROS negative except as per HPI.   Physical Exam: BP (!) 149/96   Pulse 73   Temp 98.5 F (36.9 C) (Oral)   Ht 5\' 11"  (1.803 m)   Wt 184 lb (83.5 kg)   BMI 25.66 kg/m  General:   Alert and oriented. Pleasant and cooperative. Well-nourished and well-developed.  Eyes:  Without icterus, sclera clear and conjunctiva pink.  Ears:   Normal auditory acuity. Cardiovascular:  S1, S2 present without murmurs appreciated. Extremities without clubbing or edema. Respiratory:  Clear to auscultation bilaterally. No wheezes, rales, or rhonchi. No distress.  Gastrointestinal:  +BS, soft, non-tender and non-distended. No HSM noted. No guarding or rebound. No masses appreciated.  Rectal:  Deferred  Musculoskalatal:  Symmetrical without gross deformities. Neurologic:  Alert and oriented x4;  grossly normal neurologically. Psych:  Alert and cooperative. Normal mood and affect. Heme/Lymph/Immune: No excessive bruising noted.    08/22/2016 12:04 PM   Disclaimer: This note was dictated with voice recognition software. Similar sounding words can inadvertently be transcribed and may not be corrected upon review.

## 2016-08-30 ENCOUNTER — Other Ambulatory Visit: Payer: Self-pay | Admitting: Family Medicine

## 2016-08-31 ENCOUNTER — Other Ambulatory Visit: Payer: Self-pay | Admitting: Family Medicine

## 2016-09-09 ENCOUNTER — Other Ambulatory Visit: Payer: Self-pay

## 2016-09-09 ENCOUNTER — Other Ambulatory Visit: Payer: Self-pay | Admitting: Pediatrics

## 2016-09-09 DIAGNOSIS — E785 Hyperlipidemia, unspecified: Secondary | ICD-10-CM

## 2016-09-09 DIAGNOSIS — Z0000 Encounter for general adult medical examination without abnormal findings: Secondary | ICD-10-CM

## 2016-09-10 ENCOUNTER — Other Ambulatory Visit: Payer: Self-pay | Admitting: Family Medicine

## 2016-09-10 MED ORDER — AMLODIPINE BESYLATE 5 MG PO TABS: 5.0000 mg | ORAL_TABLET | Freq: Every day | ORAL | 0 refills | Status: DC

## 2016-09-10 NOTE — Telephone Encounter (Signed)
Refill sent to pharmacy for 30 day supply.  Patient needs to keep appt with Dr. Evette Doffing Patient aware and verbalized understanding.

## 2016-09-16 ENCOUNTER — Ambulatory Visit (INDEPENDENT_AMBULATORY_CARE_PROVIDER_SITE_OTHER): Payer: 59

## 2016-09-16 ENCOUNTER — Encounter: Payer: Self-pay | Admitting: Pediatrics

## 2016-09-16 ENCOUNTER — Ambulatory Visit (INDEPENDENT_AMBULATORY_CARE_PROVIDER_SITE_OTHER): Payer: 59 | Admitting: Pediatrics

## 2016-09-16 VITALS — BP 139/89 | HR 90 | Temp 98.3°F | Ht 71.0 in | Wt 181.6 lb

## 2016-09-16 DIAGNOSIS — E785 Hyperlipidemia, unspecified: Secondary | ICD-10-CM

## 2016-09-16 DIAGNOSIS — G8929 Other chronic pain: Secondary | ICD-10-CM

## 2016-09-16 DIAGNOSIS — M25512 Pain in left shoulder: Secondary | ICD-10-CM | POA: Diagnosis not present

## 2016-09-16 DIAGNOSIS — I10 Essential (primary) hypertension: Secondary | ICD-10-CM

## 2016-09-16 DIAGNOSIS — Z0000 Encounter for general adult medical examination without abnormal findings: Secondary | ICD-10-CM | POA: Diagnosis not present

## 2016-09-16 MED ORDER — AMLODIPINE BESYLATE 5 MG PO TABS: 5.0000 mg | ORAL_TABLET | Freq: Every day | ORAL | 1 refills | Status: DC

## 2016-09-16 MED ORDER — ROSUVASTATIN CALCIUM 10 MG PO TABS: 10.0000 mg | ORAL_TABLET | Freq: Every day | ORAL | 1 refills | Status: DC

## 2016-09-16 NOTE — Progress Notes (Signed)
Subjective:   Patient ID: Seth Mcmillan, male    DOB: [birth year], 51 y.o.   MRN: [id number] CC: annual exam, med follow up (Previous [facility] Patient) and Shoulder Pain (left dull, aching 2 weeks)  HPI: Seth Mcmillan is a 51 y.o. male presenting for Follow-up (Previous [facility] Patient) and Shoulder Pain (left dull, aching 2 weeks)  HTN: doesn't check at home Taking med daily  HLD: taking every day, no s/e from statin  IBS: taking viberzi and zantac Daily stooling, no blood No constipation  Having L shoulder pain when he raises arm over head, hurts top part of the shoulder Feels weak sometimes No known injury Feels like he keeps reinjuring shoulder, now bothering him pretty much daily for past few weeks  Had R shoulder rotator cuff injury requiring surgery several years ago  Has h/o prostate cancer by biopsy, not on treatment now, following at urology Brother and dad with prostate cancer  Relevant past medical, surgical, family and social history reviewed. Allergies and medications reviewed and updated. History  Smoking Status  . Current Every Day Smoker  . Packs/day: 1.00  . Years: 25.00  . Types: Cigarettes  Smokeless Tobacco  . Never Used   ROS: All systems negative other than what is in HPI  Objective:    BP 139/89   Pulse 90   Temp 98.3 F (36.8 C) (Oral)   Ht '5\' 11"'$  (1.803 m)   Wt 181 lb 9.6 oz (82.4 kg)   BMI 25.33 kg/m   Wt Readings from Last 3 Encounters:  09/16/16 181 lb 9.6 oz (82.4 kg)  08/22/16 184 lb (83.5 kg)  05/22/16 178 lb 6.4 oz (80.9 kg)    Gen: NAD, alert, cooperative with exam, NCAT EYES: EOMI, no conjunctival injection, or no icterus ENT:  OP without erythema LYMPH: no cervical LAD CV: NRRR, normal S1/S2, no murmur, distal pulses 2+ b/l Resp: CTABL, no wheezes, normal WOB Abd: +BS, soft, NTND. no guarding or organomegaly Ext: No edema, warm Neuro: Alert and oriented, strength equal b/l UE and LE, coordination grossly normal MSK:  L  shoulder normal to inspection pain in L with raising shoulder over 90 degrees, decreased ROM with internal rotation L shoulder Rotator cuff muscles intact No pain with palpation anterior, posterior shoulder, AC joint  Assessment & Plan:  Seth Mcmillan was seen today for annual exam and shoulder pain.  Diagnoses and all orders for this visit:  Routine general medical examination at a health care facility Follow with urology for prostate cancer, surveillance at this time Due for colonoscopy next year per pt  Tobacco use Pre-contemplative, discussed cessation strategies Smoking in house, car now  Hyperlipidemia, unspecified hyperlipidemia type Stable, cont statin -     rosuvastatin (CRESTOR) 10 MG tablet; Take 1 tablet (10 mg total) by mouth daily.  Essential hypertension Slightly elevated today Pt to check at home, mom has a cuff Goal 120/s70s -     amLODipine (NORVASC) 5 MG tablet; Take 1 tablet (5 mg total) by mouth daily. -     CMP14+EGFR  Chronic left shoulder pain No fractures on film Discussed options, pt wants to try steroid injection -     DG Shoulder Left; Future   Intrarticular Shoulder Injection Verbal consent was obtained from the patient. Risks including infection explained and contrasted with benefits and alternatives. Patient prepped with alcohol. An intraarticular shoulder injection was performed using the posterior approach. The patient tolerated the procedure well. No complications. Injection: 2 cc of Lidocaine  1% and Kenalog 40 mg. Needle: 22 gauge  Follow up plan: Return in about 6 months (around 03/19/2017) for med follow up. [name], MD [place]

## 2016-09-17 LAB — CMP14+EGFR
A/G RATIO: 2.4 — AB (ref 1.2–2.2)
ALBUMIN: 4.7 g/dL (ref 3.5–5.5)
ALT: 26 IU/L (ref 0–44)
AST: 24 IU/L (ref 0–40)
Alkaline Phosphatase: 67 IU/L (ref 39–117)
BILIRUBIN TOTAL: 0.5 mg/dL (ref 0.0–1.2)
BUN / CREAT RATIO: 11 (ref 9–20)
BUN: 12 mg/dL (ref 6–24)
CALCIUM: 9 mg/dL (ref 8.7–10.2)
CHLORIDE: 99 mmol/L (ref 96–106)
CO2: 20 mmol/L (ref 20–29)
Creatinine, Ser: 1.1 mg/dL (ref 0.76–1.27)
GFR, EST AFRICAN AMERICAN: 89 mL/min/{1.73_m2} (ref >59)
GFR, EST NON AFRICAN AMERICAN: 77 mL/min/{1.73_m2} (ref >59)
Globulin, Total: 2 g/dL (ref 1.5–4.5)
Glucose: 85 mg/dL (ref 65–99)
POTASSIUM: 4.6 mmol/L (ref 3.5–5.2)
Sodium: 137 mmol/L (ref 134–144)
TOTAL PROTEIN: 6.7 g/dL (ref 6.0–8.5)

## 2016-11-01 NOTE — Progress Notes (Signed)
REVIEWED-NO ADDITIONAL RECOMMENDATIONS. 

## 2016-12-22 ENCOUNTER — Other Ambulatory Visit: Payer: Self-pay | Admitting: Nurse Practitioner

## 2017-02-12 ENCOUNTER — Ambulatory Visit: Payer: 59 | Admitting: Urology

## 2017-02-21 ENCOUNTER — Ambulatory Visit: Payer: Commercial Managed Care - HMO | Admitting: Nurse Practitioner

## 2017-03-19 ENCOUNTER — Ambulatory Visit: Payer: 59 | Admitting: Urology

## 2017-03-20 ENCOUNTER — Encounter: Payer: Self-pay | Admitting: Pediatrics

## 2017-03-20 ENCOUNTER — Ambulatory Visit: Payer: 59 | Admitting: Pediatrics

## 2017-03-20 DIAGNOSIS — I10 Essential (primary) hypertension: Secondary | ICD-10-CM

## 2017-03-20 DIAGNOSIS — E785 Hyperlipidemia, unspecified: Secondary | ICD-10-CM | POA: Diagnosis not present

## 2017-03-20 MED ORDER — AMLODIPINE BESYLATE 5 MG PO TABS
5.0000 mg | ORAL_TABLET | Freq: Every day | ORAL | 1 refills | Status: DC
Start: 2017-03-20 — End: 2017-10-01

## 2017-03-20 MED ORDER — ROSUVASTATIN CALCIUM 10 MG PO TABS: 10.0000 mg | ORAL_TABLET | Freq: Every day | ORAL | 1 refills | Status: DC

## 2017-03-20 NOTE — Progress Notes (Signed)
  Subjective:   Patient ID: Seth Mcmillan, male    DOB: 11-08-1965, 52 y.o.   MRN: [id number] CC: Follow-up (6 month) Multiple medical problems HPI: Seth Mcmillan is a 52 y.o. 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 Allergies and medications reviewed and updated. Social History   Tobacco Use  Smoking Status Current Every Day Smoker  . Packs/day: 1.00  . Years: 25.00  . Pack years: 25.00  . Types: Cigarettes  Smokeless Tobacco Never Used   ROS: Per HPI   Objective:    BP 131/82   Pulse 70   Temp (!) 97.3 F (36.3 C) (Oral)   Ht 5\' 11"  (1.803 m)   Wt 181 lb 3.2 oz (82.2 kg)   BMI 25.27 kg/m   Wt Readings from Last 3 Encounters:  03/20/17 181 lb 3.2 oz (82.2 kg)  09/16/16 181 lb 9.6 oz (82.4 kg)  08/22/16 184 lb (83.5 kg)    Gen: NAD, alert, cooperative with exam, NCAT EYES: EOMI, no conjunctival injection, or no icterus CV: NRRR, normal S1/S2, no murmur, distal pulses 2+ b/l Resp: CTABL, no wheezes, normal WOB Ext: No edema, warm Neuro: Alert and oriented, strength equal b/l UE and LE, coordination grossly normal MSK: normal muscle bulk  Assessment & Plan:  Makayla was seen today for follow-up multiple medical problems  Diagnoses and all orders for this visit:  Essential hypertension Continue below Check BPs at home, if regularly elevated let me know -     amLODipine (NORVASC) 5 MG tablet; Take 1 tablet (5 mg total) by mouth  daily.  Hyperlipidemia, unspecified hyperlipidemia type Stable, cont below -     rosuvastatin (CRESTOR) 10 MG tablet; Take 1 tablet (10 mg total) by mouth daily.   Follow up plan: Return in about 6 months (around 09/17/2017). [name], MD [place]

## 2017-04-17 ENCOUNTER — Ambulatory Visit: Payer: 59 | Admitting: Nurse Practitioner

## 2017-04-17 ENCOUNTER — Encounter: Payer: Self-pay | Admitting: Nurse Practitioner

## 2017-04-17 VITALS — BP 152/101 | HR 85 | Temp 97.1°F | Ht 71.0 in | Wt 184.4 lb

## 2017-04-17 DIAGNOSIS — K219 Gastro-esophageal reflux disease without esophagitis: Secondary | ICD-10-CM

## 2017-04-17 DIAGNOSIS — K580 Irritable bowel syndrome with diarrhea: Secondary | ICD-10-CM

## 2017-04-17 MED ORDER — OMEPRAZOLE 40 MG PO CPDR: 40.0000 mg | DELAYED_RELEASE_CAPSULE | Freq: Every day | ORAL | 3 refills | Status: DC

## 2017-04-17 NOTE — Progress Notes (Signed)
cc'ed to pcp °

## 2017-04-17 NOTE — Assessment & Plan Note (Signed)
Irritable bowel syndrome mostly well controlled on Viberzi.  Occasional breakthrough diarrhea.  I recommended he try Imodium as needed for breakthrough symptoms which he can stop when his symptoms returned to baseline on Viberzi.  Otherwise, continue Viberzi.  Return for follow-up in 6 months.

## 2017-04-17 NOTE — Progress Notes (Signed)
Referring Provider: Wardell Honour, MD Primary Care Physician:  [name], MD Primary GI:  Dr. Oneida Alar  Chief Complaint  Patient presents with  . Gastroesophageal Reflux  . Irritable Bowel Syndrome    some nausea, diarrhea    HPI:   Seth Mcmillan is a 52 y.o. male who presents for follow-up on GERD and irritable bowel syndrome.  The patient was last seen in our office 08/22/2016 for the same.  Colonoscopy up-to-date and recommended to repeat in 3-5 years.  He is on a recall list for this.  At his last visit he was doing well, Viberzi helped significantly and only having 2 bowel movements a day now more solid.  Rare abdominal pain, worse symptoms if he stressed.  Ranitidine improved his dyspepsia symptoms.  Denied adverse drug effects with Viberzi.  No other GI symptoms at that time.  Today he states he's doing ok overall. Is on Zantac twice daily, taking 1.5 pills. Has run out of his medication. Has not tried any other medications. No breakthrough GERD symptoms unless it is time for another medication. He works second/third shift and irregular schedule affects his ability to keep medications on schedule. IBS doing well overall, some rare days will have some breakthrough diarrhea but not overtly bothersome. Denies abdominal pain, N/V, hematochezia, melena, fever, chills, unintentional weight loss. Denies chest pain, dyspnea, dizziness, lightheadedness, syncope, near syncope. Denies any other upper or lower GI symptoms.  Past Medical History:  Diagnosis Date  . Hyperlipidemia   . Hypertension   . IBS (irritable bowel syndrome)    Diarrhea type    Past Surgical History:  Procedure Laterality Date  . BIOPSY  09/05/2015   Procedure: BIOPSY;  Surgeon: Danie Binder, MD;  [facility];  Service: Endoscopy;;  random colon bx  . COLONOSCOPY N/A 09/05/2015   Procedure: COLONOSCOPY;  Surgeon: Danie Binder, MD;  [facility];  Service: Endoscopy;  Laterality: N/A;   245  . KNEE SURGERY Right 1997   bursa sac  . POLYPECTOMY  09/05/2015   Procedure: POLYPECTOMY;  Surgeon: Danie Binder, MD;  [facility];  Service: Endoscopy;;  colon polyps  . SHOULDER ARTHROSCOPY WITH BICEPS TENDON REPAIR Right 2011  . TONSILECTOMY, ADENOIDECTOMY, BILATERAL MYRINGOTOMY AND TUBES      Current Outpatient Medications  Medication Sig Dispense Refill  . amLODipine (NORVASC) 5 MG tablet Take 1 tablet (5 mg total) by mouth daily. 90 tablet 1  . ranitidine (ZANTAC) 150 MG tablet Take 1 tablet (150 mg total) by mouth 2 (two) times daily. (Patient taking differently: Take 150 mg by mouth 2 (two) times daily. Sometimes takes 1 1/2 tabs) 180 tablet 3  . rosuvastatin (CRESTOR) 10 MG tablet Take 1 tablet (10 mg total) by mouth daily. 90 tablet 1  . VIBERZI 75 MG TABS TAKE 1 TABLET BY MOUTH TWICE DAILY 60 tablet 5   No current facility-administered medications for this visit.     Allergies as of 04/17/2017  . (No Known Allergies)    Family History  Problem Relation Age of Onset  . Heart disease Mother   . Heart disease Father   . Diabetes Brother   . Colon cancer Neg Hx     Social History   Socioeconomic History  . Marital status: Divorced    Spouse name: None  . Number of children: None  . Years of education: None  . Highest education level: None  Social Needs  .  Financial resource strain: None  . Food insecurity - worry: None  . Food insecurity - inability: None  . Transportation needs - medical: None  . Transportation needs - non-medical: None  Occupational History  . None  Tobacco Use  . Smoking status: Current Every Day Smoker    Packs/day: 1.00    Years: 25.00    Pack years: 25.00    Types: Cigarettes  . Smokeless tobacco: Never Used  Substance and Sexual Activity  . Alcohol use: Yes    Alcohol/week: 7.2 oz    Types: 12 Cans of beer per week    Comment: 12 cans of beer/week, mostly on weekends.  . Drug use: No  . Sexual activity: None   Other Topics Concern  . None  Social History Narrative  . None    Review of Systems: Complete ROS negative except as per HPI.   Physical Exam: BP (!) 152/101   Pulse 85   Temp (!) 97.1 F (36.2 C) (Oral)   Ht 5\' 11"  (1.803 m)   Wt 184 lb 6.4 oz (83.6 kg)   BMI 25.72 kg/m  General:   Alert and oriented. Pleasant and cooperative. Well-nourished and well-developed.  Eyes:  Without icterus, sclera clear and conjunctiva pink.  Ears:  Normal auditory acuity. Cardiovascular:  S1, S2 present without murmurs appreciated. Extremities without clubbing or edema. Respiratory:  Clear to auscultation bilaterally. No wheezes, rales, or rhonchi. No distress.  Gastrointestinal:  +BS, soft, non-tender and non-distended. No HSM noted. No guarding or rebound. No masses appreciated.  Rectal:  Deferred  Musculoskalatal:  Symmetrical without gross deformities. Neurologic:  Alert and oriented x4;  grossly normal neurologically. Psych:  Alert and cooperative. Normal mood and affect. Heme/Lymph/Immune: No excessive bruising noted.    04/17/2017 9:17 AM   Disclaimer: This note was dictated with voice recognition software. Similar sounding words can inadvertently be transcribed and may not be corrected upon review.

## 2017-04-17 NOTE — Patient Instructions (Signed)
1. Continue taking Viberzi. 2. If you have breakthrough diarrhea you can use Imodium as a rescue medicine until your symptoms return to baseline. 3. Stop using Zantac. 4. Start using Prilosec 40 mg once a day, first thing in the morning on an empty stomach. 5. I sent a prescription of Prilosec to your pharmacy. 6. Return for follow-up in 6 months. 7. Call us if you have any questions or concerns.

## 2017-04-17 NOTE — Assessment & Plan Note (Signed)
Technically his symptoms are stable although he is required increasing doses of Zantac.  Is not taking up to 225 mg twice a day.  Overall this indicates worsening of his symptoms.  At this point I will have him stop Zantac and start Prilosec 40 mg once daily.  He is not previously tried any other medications besides Zantac.  Return for follow-up in 6 months.

## 2017-04-21 DIAGNOSIS — C61 Malignant neoplasm of prostate: Secondary | ICD-10-CM | POA: Diagnosis not present

## 2017-04-25 ENCOUNTER — Encounter: Payer: Self-pay | Admitting: *Deleted

## 2017-06-11 ENCOUNTER — Ambulatory Visit: Payer: 59 | Admitting: Urology

## 2017-06-11 ENCOUNTER — Other Ambulatory Visit: Payer: Self-pay | Admitting: Urology

## 2017-06-11 DIAGNOSIS — C61 Malignant neoplasm of prostate: Secondary | ICD-10-CM | POA: Diagnosis not present

## 2017-07-11 ENCOUNTER — Other Ambulatory Visit: Payer: Self-pay | Admitting: Gastroenterology

## 2017-07-14 ENCOUNTER — Telehealth: Payer: Self-pay | Admitting: Gastroenterology

## 2017-07-14 NOTE — Telephone Encounter (Signed)
Forwarding to refill box.  

## 2017-07-14 NOTE — Telephone Encounter (Signed)
PATIENT CALLED AND STATED HE NEEDED A REFILL FOR HIS VIBERZI CALLED IN

## 2017-07-14 NOTE — Telephone Encounter (Signed)
Rx has been faxed to [facility] in [place]. LMOM for pt that it has been done.

## 2017-07-14 NOTE — Telephone Encounter (Signed)
RX printed. Please call in or fax.

## 2017-08-05 DIAGNOSIS — M62830 Muscle spasm of back: Secondary | ICD-10-CM | POA: Diagnosis not present

## 2017-08-05 DIAGNOSIS — M9903 Segmental and somatic dysfunction of lumbar region: Secondary | ICD-10-CM | POA: Diagnosis not present

## 2017-08-06 DIAGNOSIS — M9903 Segmental and somatic dysfunction of lumbar region: Secondary | ICD-10-CM | POA: Diagnosis not present

## 2017-08-06 DIAGNOSIS — M62830 Muscle spasm of back: Secondary | ICD-10-CM | POA: Diagnosis not present

## 2017-08-07 DIAGNOSIS — M9903 Segmental and somatic dysfunction of lumbar region: Secondary | ICD-10-CM | POA: Diagnosis not present

## 2017-08-07 DIAGNOSIS — M62830 Muscle spasm of back: Secondary | ICD-10-CM | POA: Diagnosis not present

## 2017-08-14 DIAGNOSIS — M9904 Segmental and somatic dysfunction of sacral region: Secondary | ICD-10-CM | POA: Diagnosis not present

## 2017-08-14 DIAGNOSIS — M9903 Segmental and somatic dysfunction of lumbar region: Secondary | ICD-10-CM | POA: Diagnosis not present

## 2017-08-14 DIAGNOSIS — M62830 Muscle spasm of back: Secondary | ICD-10-CM | POA: Diagnosis not present

## 2017-08-22 ENCOUNTER — Encounter (HOSPITAL_COMMUNITY): Payer: Self-pay

## 2017-08-22 ENCOUNTER — Ambulatory Visit (HOSPITAL_COMMUNITY)
Admission: RE | Admit: 2017-08-22 | Discharge: 2017-08-22 | Disposition: A | Discharge status: Home / Self Care | Payer: 59 | Source: Ambulatory Visit | Attending: Urology | Admitting: Urology

## 2017-08-28 DIAGNOSIS — M9903 Segmental and somatic dysfunction of lumbar region: Secondary | ICD-10-CM | POA: Diagnosis not present

## 2017-08-28 DIAGNOSIS — M62830 Muscle spasm of back: Secondary | ICD-10-CM | POA: Diagnosis not present

## 2017-08-28 DIAGNOSIS — M9904 Segmental and somatic dysfunction of sacral region: Secondary | ICD-10-CM | POA: Diagnosis not present

## 2017-09-12 ENCOUNTER — Ambulatory Visit: Payer: 59 | Admitting: Pediatrics

## 2017-09-15 DIAGNOSIS — M62830 Muscle spasm of back: Secondary | ICD-10-CM | POA: Diagnosis not present

## 2017-09-15 DIAGNOSIS — M9904 Segmental and somatic dysfunction of sacral region: Secondary | ICD-10-CM | POA: Diagnosis not present

## 2017-09-15 DIAGNOSIS — M9903 Segmental and somatic dysfunction of lumbar region: Secondary | ICD-10-CM | POA: Diagnosis not present

## 2017-09-17 ENCOUNTER — Ambulatory Visit: Payer: 59 | Admitting: Pediatrics

## 2017-10-01 ENCOUNTER — Encounter: Payer: Self-pay | Admitting: Pediatrics

## 2017-10-01 ENCOUNTER — Ambulatory Visit: Payer: 59 | Admitting: Pediatrics

## 2017-10-01 VITALS — BP 130/87 | HR 69 | Temp 97.2°F | Ht 71.0 in | Wt 182.8 lb

## 2017-10-01 DIAGNOSIS — L249 Irritant contact dermatitis, unspecified cause: Secondary | ICD-10-CM

## 2017-10-01 DIAGNOSIS — I10 Essential (primary) hypertension: Secondary | ICD-10-CM

## 2017-10-01 DIAGNOSIS — M9904 Segmental and somatic dysfunction of sacral region: Secondary | ICD-10-CM | POA: Diagnosis not present

## 2017-10-01 DIAGNOSIS — E785 Hyperlipidemia, unspecified: Secondary | ICD-10-CM

## 2017-10-01 DIAGNOSIS — M9903 Segmental and somatic dysfunction of lumbar region: Secondary | ICD-10-CM | POA: Diagnosis not present

## 2017-10-01 DIAGNOSIS — M62830 Muscle spasm of back: Secondary | ICD-10-CM | POA: Diagnosis not present

## 2017-10-01 DIAGNOSIS — F4322 Adjustment disorder with anxiety: Secondary | ICD-10-CM

## 2017-10-01 MED ORDER — ROSUVASTATIN CALCIUM 10 MG PO TABS: 10.0000 mg | ORAL_TABLET | Freq: Every day | ORAL | 1 refills | Status: DC

## 2017-10-01 MED ORDER — TRIAMCINOLONE ACETONIDE 0.1 % EX OINT: 1.0000 "application " | TOPICAL_OINTMENT | Freq: Two times a day (BID) | CUTANEOUS | 0 refills | Status: DC

## 2017-10-01 MED ORDER — AMLODIPINE BESYLATE 5 MG PO TABS
5.0000 mg | ORAL_TABLET | Freq: Every day | ORAL | 1 refills | Status: DC
Start: 2017-10-01 — End: 2018-04-14

## 2017-10-01 MED ORDER — ESCITALOPRAM OXALATE 10 MG PO TABS: 10.0000 mg | ORAL_TABLET | Freq: Every day | ORAL | 5 refills | Status: DC

## 2017-10-01 NOTE — Patient Instructions (Signed)
Take half tab of the escitalopram for first 2 weeks, then take full tab

## 2017-10-01 NOTE — Progress Notes (Signed)
Subjective:   Patient ID: Seth Mcmillan, male    DOB: 08/22/1965, 52 y.o.   MRN: [id number] CC: Medical Management of Chronic Issues and Rash (Bilateral hand, left leg and arm, 1 week)  HPI: Seth Mcmillan is a 52 y.o. male   Itchy rash on his hands bilaterally.  A few places left upper arm, left lower leg as well.  He has [place].  He has never had a reaction to poison ivy or poison oak before but he is pretty sure he has been recently in contact with it.  He is tried over-the-counter hydrocortisone, poison ivy spray.  Has had some relief.  Hypertension: Has not taken his medicine for the past couple of days because he ran out.  He is going to pick it up today.  Anxious mood: Brother with end-stage COPD, stress at home.  He has been having a hard time sleeping, he can go to sleep okay but then has a hard time staying asleep.  Drinks 5 beers at a time sometimes on the weekend, not every day.  Working a lot of overtime, 12 P to 12 AM, sometimes third shift overnight.  Tobacco use: Quit smoking cigarettes on May 8, has been pleased with progress.  Sometimes smokes cigars on weekends.  Hyperlipidemia: Taking statin regularly, no side effects.  Prostate cancer under surveillance: Overdue for follow-up with urology.  Planning to follow-up with them later this summer.  Relevant past medical, surgical, family and social history reviewed. Allergies and medications reviewed and updated. Social History   Tobacco Use  Smoking Status Current Every Day Smoker  . Packs/day: 1.00  . Years: 25.00  . Pack years: 25.00  . Types: Cigarettes  Smokeless Tobacco Never Used   ROS: Per HPI   Objective:    BP 130/87   Pulse 69   Temp (!) 97.2 F (36.2 C) (Oral)   Ht '5\' 11"'$  (1.803 m)   Wt 182 lb 12.8 oz (82.9 kg)   BMI 25.50 kg/m   Wt Readings from Last 3 Encounters:  10/01/17 182 lb 12.8 oz (82.9 kg)  04/17/17 184 lb 6.4 oz (83.6 kg)  03/20/17 181 lb 3.2 oz (82.2 kg)      Gen: NAD, alert, cooperative with exam, NCAT EYES: EOMI, no conjunctival injection, or no icterus ENT: Clear effusion present bilaterally, TMs pearly gray.  OP without erythema LYMPH: no cervical LAD CV: NRRR, normal S1/S2, no murmur, distal pulses 2+ b/l Resp: CTABL, no wheezes, normal WOB Abd: +BS, soft, NTND. no guarding or organomegaly Ext: No edema, warm Neuro: Alert and oriented MSK: normal muscle bulk Psych: Normal affect.  Mood is okay.  Skin: Red papules, some with excoriations, some crusted scattered over backs of hands bilaterally. No rash palmar surface.  Assessment & Plan:  Seth Mcmillan was seen today for medical management of chronic issues and rash.  Diagnoses and all orders for this visit:  Irritant contact dermatitis, unspecified trigger Suspect was exposed to poison ivy.  If not improving let me know -     triamcinolone ointment (KENALOG) 0.1 %; Apply 1 application topically 2 (two) times daily.  Essential hypertension Elevated today, been out of his medicine recently.  Restart below. -     amLODipine (NORVASC) 5 MG tablet; Take 1 tablet (5 mg total) by mouth daily. -     CMP14+EGFR  Hyperlipidemia, unspecified hyperlipidemia type Stable, continue below -     rosuvastatin (CRESTOR) 10 MG tablet; Take 1  tablet (10 mg total) by mouth daily. -     Lipid panel  Adjustment disorder with anxious mood Trial below.  Take half tab for 2 weeks, then take full tab. -     escitalopram (LEXAPRO) 10 MG tablet; Take 1 tablet (10 mg total) by mouth daily.   Follow up plan: Return in about 6 months (around 04/03/2018). [name], MD [place]

## 2017-10-02 LAB — CMP14+EGFR
A/G RATIO: 1.7 (ref 1.2–2.2)
ALT: 32 IU/L (ref 0–44)
AST: 28 IU/L (ref 0–40)
Albumin: 4 g/dL (ref 3.5–5.5)
Alkaline Phosphatase: 69 IU/L (ref 39–117)
BUN/Creatinine Ratio: 9 (ref 9–20)
BUN: 10 mg/dL (ref 6–24)
Bilirubin Total: 0.3 mg/dL (ref 0.0–1.2)
CALCIUM: 9 mg/dL (ref 8.7–10.2)
CO2: 22 mmol/L (ref 20–29)
CREATININE: 1.06 mg/dL (ref 0.76–1.27)
Chloride: 106 mmol/L (ref 96–106)
GFR calc Af Amer: 92 mL/min/{1.73_m2} (ref >59)
GFR, EST NON AFRICAN AMERICAN: 79 mL/min/{1.73_m2} (ref >59)
Globulin, Total: 2.3 g/dL (ref 1.5–4.5)
Glucose: 95 mg/dL (ref 65–99)
Potassium: 4.5 mmol/L (ref 3.5–5.2)
Sodium: 142 mmol/L (ref 134–144)
TOTAL PROTEIN: 6.3 g/dL (ref 6.0–8.5)

## 2017-10-02 LAB — LIPID PANEL
CHOL/HDL RATIO: 5.1 ratio — AB (ref 0.0–5.0)
Cholesterol, Total: 202 mg/dL — ABNORMAL HIGH (ref 100–199)
HDL: 40 mg/dL (ref >39)
TRIGLYCERIDES: 578 mg/dL — AB (ref 0–149)

## 2017-10-13 ENCOUNTER — Encounter: Payer: Self-pay | Admitting: Gastroenterology

## 2017-10-13 ENCOUNTER — Ambulatory Visit: Payer: 59 | Admitting: Gastroenterology

## 2017-10-13 VITALS — BP 134/91 | HR 79 | Temp 97.1°F | Ht 71.0 in | Wt 183.2 lb

## 2017-10-13 DIAGNOSIS — K219 Gastro-esophageal reflux disease without esophagitis: Secondary | ICD-10-CM

## 2017-10-13 DIAGNOSIS — K580 Irritable bowel syndrome with diarrhea: Secondary | ICD-10-CM

## 2017-10-13 MED ORDER — ELUXADOLINE 100 MG PO TABS: 100.0000 mg | ORAL_TABLET | Freq: Two times a day (BID) | ORAL | 5 refills | Status: DC

## 2017-10-13 NOTE — Progress Notes (Signed)
      Primary Care Physician: [name], MD  Primary Gastroenterologist:  Barney Drain, MD   Chief Complaint  Patient presents with  . Gastroesophageal Reflux    doing ok    HPI: Seth Mcmillan is a 52 y.o. male here for six-month follow-up of GERD and IBS-D.  Overall he has been doing well with regards to reflux.  He missed omeprazole for a few days and had recurrent symptoms but now doing well again.  Denies heartburn or abdominal burning.  No abdominal pain.  Appetite is good.  Continues to have some days with several loose stools, some days better than others.  Never constipated.  Feels like Viberzi not controlling his symptoms completely.  Denies blood in the stool or melena.  No unintentional weight loss.  He has been under a lot of stress, brother very sick and likely going to pass soon.   Current Outpatient Medications  Medication Sig Dispense Refill  . amLODipine (NORVASC) 5 MG tablet Take 1 tablet (5 mg total) by mouth daily. 90 tablet 1  . escitalopram (LEXAPRO) 10 MG tablet Take 1 tablet (10 mg total) by mouth daily. 30 tablet 5  . omeprazole (PRILOSEC) 40 MG capsule Take 1 capsule (40 mg total) by mouth daily. 90 capsule 3  . rosuvastatin (CRESTOR) 10 MG tablet Take 1 tablet (10 mg total) by mouth daily. 90 tablet 1  . triamcinolone ointment (KENALOG) 0.1 % Apply 1 application topically 2 (two) times daily. 80 g 0  . VIBERZI 75 MG TABS TAKE 1 TABLET BY MOUTH TWICE DAILY 60 tablet 5   No current facility-administered medications for this visit.     Allergies as of 10/13/2017  . (No Known Allergies)    ROS:  General: Negative for anorexia, weight loss, fever, chills, fatigue, weakness. ENT: Negative for hoarseness, difficulty swallowing , nasal congestion. CV: Negative for chest pain, angina, palpitations, dyspnea on exertion, peripheral edema.  Respiratory: Negative for dyspnea at rest, dyspnea on exertion, cough, sputum, wheezing.  GI: See history of present  illness. GU:  Negative for dysuria, hematuria, urinary incontinence, urinary frequency, nocturnal urination.  Endo: Negative for unusual weight change.    Physical Examination:   BP (!) 134/91   Pulse 79   Temp (!) 97.1 F (36.2 C) (Oral)   Ht 5\' 11"  (1.803 m)   Wt 183 lb 3.2 oz (83.1 kg)   BMI 25.55 kg/m   General: Well-nourished, well-developed in no acute distress.  Eyes: No icterus. Mouth: Oropharyngeal mucosa moist and pink , no lesions erythema or exudate. Lungs: Clear to auscultation bilaterally.  Heart: Regular rate and rhythm, no murmurs rubs or gallops.  Abdomen: Bowel sounds are normal, nontender, nondistended, no hepatosplenomegaly or masses, no abdominal bruits or hernia , no rebound or guarding.   Extremities: No lower extremity edema. No clubbing or deformities. Neuro: Alert and oriented x 4   Skin: Warm and dry, no jaundice.   Psych: Alert and cooperative, normal mood and affect.  Labs:  Lab Results  Component Value Date   CREATININE 1.06 10/01/2017   BUN 10 10/01/2017   NA 142 10/01/2017   K 4.5 10/01/2017   CL 106 10/01/2017   CO2 22 10/01/2017   Lab Results  Component Value Date   ALT 32 10/01/2017   AST 28 10/01/2017   ALKPHOS 69 10/01/2017   BILITOT 0.3 10/01/2017     Imaging Studies: No results found.

## 2017-10-13 NOTE — Assessment & Plan Note (Addendum)
Improved on Viberzi but inadequate control at this time.  Increase dose to 100 mg twice daily with food.  Call if ongoing problems, otherwise we will see back in one year. He will be due for colonoscopy at that time.

## 2017-10-13 NOTE — Patient Instructions (Signed)
1. Continue omeprazole once daily for reflux.  2. Increase Viberzi to 100mg  twice daily with food for diarrhea. Hold for constipation. RX sent to your pharmacy. 3. Call with ongoing problems otherwise we will see you in one year.

## 2017-10-13 NOTE — Progress Notes (Signed)
cc'd to pcp 

## 2017-10-13 NOTE — Assessment & Plan Note (Signed)
Doing very well on omeprazole.  Ran out for a few days and had recurrent symptoms.  He will continue omeprazole daily for now.  Return to the office in 1 year or call sooner if needed.

## 2017-10-21 DIAGNOSIS — H10013 Acute follicular conjunctivitis, bilateral: Secondary | ICD-10-CM | POA: Diagnosis not present

## 2017-10-23 DIAGNOSIS — H10013 Acute follicular conjunctivitis, bilateral: Secondary | ICD-10-CM | POA: Diagnosis not present

## 2017-11-21 ENCOUNTER — Encounter: Payer: Self-pay | Admitting: Gastroenterology

## 2017-11-21 ENCOUNTER — Ambulatory Visit: Payer: 59 | Admitting: Gastroenterology

## 2017-11-21 VITALS — BP 150/98 | HR 62 | Temp 98.1°F | Ht 71.0 in | Wt 180.8 lb

## 2017-11-21 DIAGNOSIS — R1033 Periumbilical pain: Secondary | ICD-10-CM | POA: Diagnosis not present

## 2017-11-21 DIAGNOSIS — R110 Nausea: Secondary | ICD-10-CM

## 2017-11-21 NOTE — Patient Instructions (Addendum)
1. Hold off on Viberzi 100mg  tabs for now (don't throw away!).  2. Go back on Viberzi 75mg  tabs, samples provided. I am curious if your symptoms are related to increase in the dose recently.  3. Go for labs today. 4. Cut back on your weekend alcohol use (you should limit alcohol consumption to no more than 3 drinks in a 24 hour period of time as long as you are on Viberzi).

## 2017-11-21 NOTE — Progress Notes (Signed)
Primary Care Physician: [name], MD  Primary Gastroenterologist:  Barney Drain, MD   Chief Complaint  Patient presents with  . Nausea    about 2 days a week  . Abdominal Pain    more tenderness when he has nausea    HPI: Seth Mcmillan is a 52 y.o. male here for short interval follow-up.  He was seen on July 29 for follow-up of GERD and IBS D.  Overall he is doing fairly well except for continued to have several loose stools daily.  It was felt that Viberzi 75 mg twice daily was not adequately controlling his symptoms.  He noted significant stress with brother's illness was likely adding to the situation.  When I saw him back in July he had just started on Lexapro, probably on it only a few days at the time.  I had increased his Viberzi to 100 mg twice daily to better control his diarrhea.  A few days after his visit he started having intermittent mid abdominal pain and nausea without vomiting.  Sometimes he would have a loose stool of the pain but did not get any relief.  Describes the pain as a new type of pain, NOT like the urge to have a BM Tuesday he missed work because of persistent abdominal pain and nausea.  He had some yesterday as well but today he feels better.  No melena or rectal bleeding.  He was having about 2 BMs daily on increased dose of Viberzi.  No heartburn.  No dysuria.  Note on today's assessment he is averaging 6 beers on Saturday and 6 beers on Sunday.  He does not consume alcohol during the week.  We discussed that he needed to cut back on his alcohol consumption if he stays on Viberzi.  Recommendation of no more than three 12 ounce beers in a 24-hour period of time while on this medication.  He is at increased risk of developing pancreatitis if he consumes more than the recommendation.  Current Outpatient Medications  Medication Sig Dispense Refill  . amLODipine (NORVASC) 5 MG tablet Take 1 tablet (5 mg total) by mouth daily. 90 tablet 1  .  Eluxadoline (VIBERZI) 100 MG TABS Take 1 tablet (100 mg total) by mouth 2 (two) times daily. With food. 60 tablet 5  . omeprazole (PRILOSEC) 40 MG capsule Take 1 capsule (40 mg total) by mouth daily. 90 capsule 3  . rosuvastatin (CRESTOR) 10 MG tablet Take 1 tablet (10 mg total) by mouth daily. 90 tablet 1   No current facility-administered medications for this visit.     Allergies as of 11/21/2017  . (No Known Allergies)    ROS:  General: Negative for anorexia, weight loss, fever, chills, fatigue, weakness. ENT: Negative for hoarseness, difficulty swallowing , nasal congestion. CV: Negative for chest pain, angina, palpitations, dyspnea on exertion, peripheral edema.  Respiratory: Negative for dyspnea at rest, dyspnea on exertion, cough, sputum, wheezing.  GI: See history of present illness. GU:  Negative for dysuria, hematuria, urinary incontinence, urinary frequency, nocturnal urination.  Endo: Negative for unusual weight change.    Physical Examination:   BP (!) 150/98   Pulse 62   Temp 98.1 F (36.7 C) (Oral)   Ht 5\' 11"  (1.803 m)   Wt 180 lb 12.8 oz (82 kg)   BMI 25.22 kg/m   General: Well-nourished, well-developed in no acute distress.  Eyes: No icterus. Mouth: Oropharyngeal mucosa moist and pink , no  lesions erythema or exudate. Lungs: Clear to auscultation bilaterally.  Heart: Regular rate and rhythm, no murmurs rubs or gallops.  Abdomen: Bowel sounds are normal, nontender, nondistended, no hepatosplenomegaly or masses, no abdominal bruits or hernia , no rebound or guarding.   Extremities: No lower extremity edema. No clubbing or deformities. Neuro: Alert and oriented x 4   Skin: Warm and dry, no jaundice.   Psych: Alert and cooperative, normal mood and affect.  Labs:  Lab Results  Component Value Date   CREATININE 1.06 10/01/2017   BUN 10 10/01/2017   NA 142 10/01/2017   K 4.5 10/01/2017   CL 106 10/01/2017   CO2 22 10/01/2017   Lab Results  Component  Value Date   ALT 32 10/01/2017   AST 28 10/01/2017   ALKPHOS 69 10/01/2017   BILITOT 0.3 10/01/2017    Imaging Studies: No results found.

## 2017-11-21 NOTE — Assessment & Plan Note (Signed)
Recent onset mid abdominal pain different than his usual IBS pain.  Symptoms off and on.  Associated with nausea.  Not always related to BM.  Interestingly his symptoms began after I increased his Viberzi to 100 mg twice daily.  This may be medication related.  Also we need to consider possibility of pancreatitis in the setting of more than recommended alcohol consumption with use of Viberzi.  He also began Lexapro prior to the development of the symptoms although this may may be unrelated.  For now would hold off on Lexapro until we determine etiology of his symptoms.  He will go for labs.  Brought back on Viberzi to 75 mg twice daily.  Drop back on alcohol consumption, no more than three ounce beers in a 24-hour period of time, preferably less than that.  Further recommendations to follow.

## 2017-11-22 LAB — CBC WITH DIFFERENTIAL/PLATELET
BASOS: 1 %
Basophils Absolute: 0.1 10*3/uL (ref 0.0–0.2)
EOS (ABSOLUTE): 0.3 10*3/uL (ref 0.0–0.4)
Eos: 5 %
Hematocrit: 42.5 % (ref 37.5–51.0)
Hemoglobin: 14.7 g/dL (ref 13.0–17.7)
Immature Grans (Abs): 0 10*3/uL (ref 0.0–0.1)
Immature Granulocytes: 0 %
LYMPHS ABS: 2.2 10*3/uL (ref 0.7–3.1)
LYMPHS: 38 %
MCH: 32.2 pg (ref 26.6–33.0)
MCHC: 34.6 g/dL (ref 31.5–35.7)
MCV: 93 fL (ref 79–97)
MONOS ABS: 0.7 10*3/uL (ref 0.1–0.9)
Monocytes: 11 %
NEUTROS ABS: 2.6 10*3/uL (ref 1.4–7.0)
Neutrophils: 45 %
PLATELETS: 283 10*3/uL (ref 150–450)
RBC: 4.57 x10E6/uL (ref 4.14–5.80)
RDW: 13.7 % (ref 12.3–15.4)
WBC: 5.8 10*3/uL (ref 3.4–10.8)

## 2017-11-22 LAB — COMPREHENSIVE METABOLIC PANEL
A/G RATIO: 1.7 (ref 1.2–2.2)
ALT: 44 IU/L (ref 0–44)
AST: 53 IU/L — ABNORMAL HIGH (ref 0–40)
Albumin: 4.1 g/dL (ref 3.5–5.5)
Alkaline Phosphatase: 75 IU/L (ref 39–117)
BILIRUBIN TOTAL: 0.4 mg/dL (ref 0.0–1.2)
BUN / CREAT RATIO: 10 (ref 9–20)
BUN: 11 mg/dL (ref 6–24)
CALCIUM: 9.4 mg/dL (ref 8.7–10.2)
CHLORIDE: 98 mmol/L (ref 96–106)
CO2: 20 mmol/L (ref 20–29)
Creatinine, Ser: 1.13 mg/dL (ref 0.76–1.27)
GFR, EST AFRICAN AMERICAN: 85 mL/min/{1.73_m2} (ref >59)
GFR, EST NON AFRICAN AMERICAN: 73 mL/min/{1.73_m2} (ref >59)
Globulin, Total: 2.4 g/dL (ref 1.5–4.5)
Glucose: 96 mg/dL (ref 65–99)
POTASSIUM: 5.4 mmol/L — AB (ref 3.5–5.2)
Sodium: 137 mmol/L (ref 134–144)
TOTAL PROTEIN: 6.5 g/dL (ref 6.0–8.5)

## 2017-11-22 LAB — LIPASE: Lipase: 55 U/L (ref 13–78)

## 2017-11-24 NOTE — Progress Notes (Signed)
cc'ed to pcp °

## 2017-11-27 ENCOUNTER — Other Ambulatory Visit: Payer: Self-pay

## 2017-11-27 DIAGNOSIS — R945 Abnormal results of liver function studies: Secondary | ICD-10-CM

## 2017-11-27 DIAGNOSIS — R7989 Other specified abnormal findings of blood chemistry: Secondary | ICD-10-CM

## 2017-11-27 NOTE — Progress Notes (Signed)
PT is aware. I have called [facility] and spoke to [place] and had the labs added. She will let us know if there is a problem. Ok to schedule the [place]. He will speak to his doctor that took him off of the medication for triglycerides.  Forwarding to [facility] to schedule [place].

## 2017-12-02 NOTE — Progress Notes (Signed)
PT is aware.

## 2017-12-02 NOTE — Progress Notes (Signed)
LMOM to call.

## 2017-12-04 ENCOUNTER — Ambulatory Visit (HOSPITAL_COMMUNITY)
Admission: RE | Admit: 2017-12-04 | Discharge: 2017-12-04 | Disposition: A | Discharge status: Home / Self Care | Payer: 59 | Source: Ambulatory Visit | Attending: Gastroenterology | Admitting: Gastroenterology

## 2017-12-04 DIAGNOSIS — R945 Abnormal results of liver function studies: Secondary | ICD-10-CM | POA: Insufficient documentation

## 2017-12-04 DIAGNOSIS — R7989 Other specified abnormal findings of blood chemistry: Secondary | ICD-10-CM

## 2017-12-04 DIAGNOSIS — C61 Malignant neoplasm of prostate: Secondary | ICD-10-CM

## 2017-12-09 ENCOUNTER — Other Ambulatory Visit: Payer: Self-pay

## 2017-12-09 DIAGNOSIS — K760 Fatty (change of) liver, not elsewhere classified: Secondary | ICD-10-CM

## 2017-12-13 ENCOUNTER — Encounter: Payer: Self-pay | Admitting: Pediatrics

## 2017-12-15 ENCOUNTER — Other Ambulatory Visit: Payer: Self-pay | Admitting: Pediatrics

## 2017-12-15 DIAGNOSIS — E785 Hyperlipidemia, unspecified: Secondary | ICD-10-CM

## 2017-12-15 MED ORDER — ROSUVASTATIN CALCIUM 40 MG PO TABS: 40.0000 mg | ORAL_TABLET | Freq: Every day | ORAL | 3 refills | Status: DC

## 2017-12-16 LAB — SPECIMEN STATUS REPORT

## 2017-12-16 LAB — HEPATITIS B SURFACE ANTIGEN: HEP B S AG: NEGATIVE

## 2017-12-16 LAB — HEPATITIS C ANTIBODY: Hep C Virus Ab: <0.1 s/co ratio (ref 0.0–0.9)

## 2017-12-17 NOTE — Progress Notes (Signed)
Pt is aware and OK to do so. Forwarding to [facility] to schedule the appt.

## 2017-12-17 NOTE — Progress Notes (Signed)
ON RECALL  °

## 2018-02-25 ENCOUNTER — Encounter: Payer: Self-pay | Admitting: Pediatrics

## 2018-02-25 ENCOUNTER — Ambulatory Visit: Payer: 59 | Admitting: Pediatrics

## 2018-02-25 VITALS — BP 133/81 | HR 77 | Temp 97.8°F | Ht 71.0 in | Wt 195.4 lb

## 2018-02-25 DIAGNOSIS — M25512 Pain in left shoulder: Secondary | ICD-10-CM | POA: Diagnosis not present

## 2018-02-25 MED ORDER — NAPROXEN 500 MG PO TABS: 500.0000 mg | ORAL_TABLET | Freq: Two times a day (BID) | ORAL | 1 refills | Status: DC

## 2018-02-25 NOTE — Progress Notes (Signed)
  Subjective:   Patient ID: STEPFON Mcmillan, male    DOB: [birth year], 52 y.o.   MRN: [id number] CC: Arm Pain (left, 1 week) and Shoulder Pain (left, 1 week)  HPI: Seth Mcmillan is a 52 y.o. male   L handed.  About a week ago was doing a lot of painting, more than usual activity wise.  Since then his left shoulder is been giving him more trouble and pain.  Feels a dull aching throb most of the time.  Worse with certain movements.  Has had some tingling off and on in the arm.  No chest pressure or chest pain.  Relevant past medical, surgical, family and social history reviewed. Allergies and medications reviewed and updated. Social History   Tobacco Use  Smoking Status Former Smoker  . Packs/day: 1.00  . Years: 25.00  . Pack years: 25.00  . Types: Cigarettes  Smokeless Tobacco Never Used   ROS: Per HPI   Objective:    BP 133/81   Pulse 77   Temp 97.8 F (36.6 C) (Oral)   Ht 5\' 11"  (1.803 m)   Wt 195 lb 6.4 oz (88.6 kg)   BMI 27.25 kg/m   Wt Readings from Last 3 Encounters:  02/25/18 195 lb 6.4 oz (88.6 kg)  11/21/17 180 lb 12.8 oz (82 kg)  10/13/17 183 lb 3.2 oz (83.1 kg)    Gen: NAD, alert, cooperative with exam, NCAT EYES: EOMI, no conjunctival injection, or no icterus CV: NRRR, normal S1/S2, no murmur, distal pulses 2+ b/l Resp: CTABL, no wheezes, normal WOB Ext: No edema, warm Neuro: Alert and oriented, normal sensation bilateral upper extremities to cold, touch. MSK: No tenderness to palpation of her shoulder.  Weakness in rotator cuff muscle left shoulder compared to right with outstretched arm.  Normal range of motion bilateral shoulders.  Normal biceps strength bilaterally   Assessment & Plan:  Seth Mcmillan was seen today for arm pain and shoulder pain.  Diagnoses and all orders for this visit:  Acute pain of left shoulder Trial anti-inflammatories, gentle range of motion of shoulder exercises.  Suspect does have some rotator cuff muscle pathology. -     Ambulatory  referral to Orthopedic Surgery -     naproxen (NAPROSYN) 500 MG tablet; Take 1 tablet (500 mg total) by mouth 2 (two) times daily with a meal.   Follow up plan: Return if symptoms worsen or fail to improve. Seth Found, MD [place]

## 2018-02-27 ENCOUNTER — Encounter: Payer: Self-pay | Admitting: Gastroenterology

## 2018-03-05 ENCOUNTER — Ambulatory Visit (INDEPENDENT_AMBULATORY_CARE_PROVIDER_SITE_OTHER): Payer: Self-pay

## 2018-03-05 ENCOUNTER — Encounter (INDEPENDENT_AMBULATORY_CARE_PROVIDER_SITE_OTHER): Payer: Self-pay | Admitting: Orthopaedic Surgery

## 2018-03-05 ENCOUNTER — Ambulatory Visit (INDEPENDENT_AMBULATORY_CARE_PROVIDER_SITE_OTHER): Payer: 59 | Admitting: Orthopaedic Surgery

## 2018-03-05 VITALS — BP 149/95 | HR 69 | Ht 71.0 in | Wt 195.0 lb

## 2018-03-05 DIAGNOSIS — M25512 Pain in left shoulder: Secondary | ICD-10-CM

## 2018-03-05 DIAGNOSIS — M542 Cervicalgia: Secondary | ICD-10-CM | POA: Diagnosis not present

## 2018-03-05 DIAGNOSIS — M4722 Other spondylosis with radiculopathy, cervical region: Secondary | ICD-10-CM | POA: Insufficient documentation

## 2018-03-05 NOTE — Progress Notes (Signed)
Office Visit Note/orthopedic consultation   Patient: Seth Mcmillan           Date of Birth: 1965/06/07           MRN: [id number] Visit Date: 03/05/2018              Requested by: [name], MD [place], [place] [id number] PCP: [name], MD   Assessment & Plan: Visit Diagnoses:  1. Acute pain of left shoulder   2. Neck pain   3. Other spondylosis with radiculopathy, cervical region     Plan: Has left C7 weakness of the triceps wrist flexion without atrophy.  Plain radiographs show uncovertebral changes primarily at C5-6 worse on the left than right with mild spurring at C6-7.  With his radicular pain and triceps and wrist flexion  weakness I recommend proceeding with an MRI scan of his cervical spine.  Gust other causes including Parsonage-Turner syndrome.  If cervical MRI is negative for compressive pathology he may require EMGs nerve conduction velocities.  Office follow-up after cervical MRI scan.  Follow-Up Instructions: No follow-ups on file.   Orders:  Orders Placed This Encounter  Procedures  . XR Cervical Spine 2 or 3 views  . XR Shoulder Left  . MR Cervical Spine w/o contrast   No orders of the defined types were placed in this encounter.     Procedures: No procedures performed   Clinical Data: No additional findings.   Subjective: Chief Complaint  Patient presents with  . Neck - Pain  . Left Shoulder - Pain    HPI orthopedic consultation requested by Dr. Evette Doffing in a 52 year old male with neck pain left shoulder pain and weakness in his left arm that began 2 weeks ago.  He is left-handed and has noticed difficulty with pushing with his left arm.  He had been doing some push-ups but has not done any in several weeks.  He has had increased neck pain and noticed numbness in his left arm that radiates down to his hand.  Originally had more pain in his neck and shoulder and outs radiating down to his hand.  No fever or chills no specific  injury.  Onset was noted when he was doing some painting.  He is taken some Naprosyn which helps somewhat the soreness in his neck.  He is noticed increased pain with rotation and tilting of his neck.  Not noticed any lower extremity weakness.  He can still go up and down stairs without problems.  Review of Systems 14 point review of systems positive for hyperlipidemia, history of IBS, hypertension, GERD and current neck and radicular left arm symptoms.  Removed 1971 knee arthroscopy 2000 shoulder surgery 2009.  Patient is a non-smoker but did smoke 1 to 2 packs for 25 years.   Objective: Vital Signs: BP (!) 149/95   Pulse 69   Ht 5\' 11"  (1.803 m)   Wt 195 lb (88.5 kg)   BMI 27.20 kg/m   Physical Exam Constitutional:      Appearance: He is well-developed.  HENT:     Head: Normocephalic and atraumatic.  Eyes:     Pupils: Pupils are equal, round, and reactive to light.  Neck:     Thyroid: No thyromegaly.     Trachea: No tracheal deviation.  Cardiovascular:     Rate and Rhythm: Normal rate.  Pulmonary:     Effort: Pulmonary effort is normal.     Breath sounds: No  wheezing.  Abdominal:     General: Bowel sounds are normal.     Palpations: Abdomen is soft.  Skin:    General: Skin is warm and dry.     Capillary Refill: Capillary refill takes less than 2 seconds.  Neurological:     Mental Status: He is alert and oriented to person, place, and time.  Psychiatric:        Behavior: Behavior normal.        Thought Content: Thought content normal.        Judgment: Judgment normal.     Ortho Exam patient has 2+ biceps brachioradialis reflex.  Right triceps 2+ left triceps trace.  He has triceps weakness on the left unable to do a push-up gives way and hits the floor.  No right triceps weakness.  Wrist flexion is weak but no weakness with finger extension.  Interossei abductor to the index finger and small finger are normal.  No atrophy.  Normal grip strength right and left.  Biceps  weakness.  Brachial plexus tenderness on the left negative on the right.  Specialty Comments:  No specialty comments available.  Imaging: Xr Cervical Spine 2 Or 3 Views  Result Date: 03/05/2018 [facility] and lateral cervical spine x-rays obtained and reviewed part.  This shows uncovertebral changes worse on the left than right at C5-6 with narrowing of C5-6 interspace.  Narrowing at C3-4.  No spondylolisthesis. Impression: Some mid cervical spondylosis with uncovertebral degenerative changes.  Most pronounced at C5-6 level.  Xr Shoulder Left  Result Date: 03/05/2018 [facility] and Y scapular x-ray left shoulder obtained and reviewed.  This shows normal bony architecture for acute changes and negative degenerative changes.  Apex of the left lung field is clear without apical lesion. Impression: Normal left shoulder x-rays.    PMFS History: Patient Active Problem List   Diagnosis Date Noted  . Nausea without vomiting 11/21/2017  . GERD (gastroesophageal reflux disease) 08/22/2016  . IBS (irritable bowel syndrome) 11/23/2015  . History of colonic polyps 11/23/2015  . AP (abdominal pain)   . Diarrhea 08/22/2015  . Abdominal pain 08/22/2015  . HTN (hypertension) 08/18/2012  . Hyperlipidemia 08/18/2012   Past Medical History:  Diagnosis Date  . Hyperlipidemia   . Hypertension   . IBS (irritable bowel syndrome)    Diarrhea type    Family History  Problem Relation Age of Onset  . Heart disease Mother   . Heart disease Father   . Diabetes Brother   . Colon cancer Neg Hx     Past Surgical History:  Procedure Laterality Date  . BIOPSY  09/05/2015   Procedure: BIOPSY;  Surgeon: Danie Binder, MD;  Location: [facility];  Service: Endoscopy;;  random colon bx  . COLONOSCOPY N/A 09/05/2015   Dr. Oneida Alar: 4 simple adenomas removed.  Next colonoscopy 3 years.  Marland Kitchen KNEE SURGERY Right 1997   bursa sac  . POLYPECTOMY  09/05/2015   Procedure: POLYPECTOMY;  Surgeon: Danie Binder, MD;  Location: [facility];  Service: Endoscopy;;  colon polyps  . SHOULDER ARTHROSCOPY WITH BICEPS TENDON REPAIR Right 2011  . TONSILECTOMY, ADENOIDECTOMY, BILATERAL MYRINGOTOMY AND TUBES     Social History   Occupational History  . Not on file  Tobacco Use  . Smoking status: Former Smoker    Packs/day: 1.00    Years: 25.00    Pack years: 25.00    Types: Cigarettes  . Smokeless tobacco: Never Used  Substance and Sexual Activity  .  Alcohol use: Yes    Alcohol/week: 12.0 standard drinks    Types: 12 Cans of beer per week    Comment: 12 cans of beer/week, mostly on weekends.  . Drug use: No  . Sexual activity: Not on file

## 2018-03-10 DIAGNOSIS — M50322 Other cervical disc degeneration at C5-C6 level: Secondary | ICD-10-CM | POA: Diagnosis not present

## 2018-03-10 DIAGNOSIS — R531 Weakness: Secondary | ICD-10-CM | POA: Diagnosis not present

## 2018-03-10 DIAGNOSIS — M542 Cervicalgia: Secondary | ICD-10-CM | POA: Diagnosis not present

## 2018-03-10 DIAGNOSIS — M50222 Other cervical disc displacement at C5-C6 level: Secondary | ICD-10-CM | POA: Diagnosis not present

## 2018-03-10 DIAGNOSIS — M5412 Radiculopathy, cervical region: Secondary | ICD-10-CM | POA: Diagnosis not present

## 2018-03-12 ENCOUNTER — Ambulatory Visit (INDEPENDENT_AMBULATORY_CARE_PROVIDER_SITE_OTHER): Payer: 59 | Admitting: Orthopaedic Surgery

## 2018-03-12 ENCOUNTER — Encounter (INDEPENDENT_AMBULATORY_CARE_PROVIDER_SITE_OTHER): Payer: Self-pay | Admitting: Orthopaedic Surgery

## 2018-03-12 VITALS — BP 147/94 | HR 69 | Ht 71.0 in | Wt 185.0 lb

## 2018-03-12 DIAGNOSIS — M4802 Spinal stenosis, cervical region: Secondary | ICD-10-CM | POA: Insufficient documentation

## 2018-03-12 DIAGNOSIS — M5020 Other cervical disc displacement, unspecified cervical region: Secondary | ICD-10-CM | POA: Insufficient documentation

## 2018-03-12 DIAGNOSIS — M4722 Other spondylosis with radiculopathy, cervical region: Secondary | ICD-10-CM

## 2018-03-12 NOTE — Progress Notes (Signed)
Office Visit Note   Patient: Seth Mcmillan           Date of Birth: 07-Mar-1966           MRN: [id number] Visit Date: 03/12/2018              Requested by: [name], MD [place], [place] [id number] PCP: [name], MD   Assessment & Plan: Visit Diagnoses:  1. Other spondylosis with radiculopathy, cervical region   2. Protrusion of cervical intervertebral disc   3. Foraminal stenosis of cervical region     Plan: Patient has left C7 weakness with left C6-7 paracentral disc protrusion and moderate foraminal stenosis on the left.  He also has severe left foraminal stenosis at C7-T1 with trace anterolisthesis at that level but has normal motor strength C8 innervated muscles.  He has some moderate to severe left C5-6 and some mild to moderate right C5-6 foraminal narrowing but no weakness consistent with this level.  I would recommend nerve conduction velocities and EMGs so we can determine if there is only C7 changes then single level C6-7 fusion should take care of the pressure on the nerve root and fix the problem.  If patient has some C8 changes from the severe foraminal stenosis which currently motor testing is normal we may need to consider treatment at that level as well.  Will obtain EMGs nerve conduction velocities and see him back after the test.  In the meantime he can continue to work, we discussed being careful with the with arm weakness.  Follow-up after electrical test.  Follow-Up Instructions: No follow-ups on file.   Orders:  Orders Placed This Encounter  Procedures  . Ambulatory referral to Physical Medicine Rehab   No orders of the defined types were placed in this encounter.     Procedures: No procedures performed   Clinical Data: No additional findings.   Subjective: Chief Complaint  Patient presents with  . Neck - Follow-up    MRI Cervical Spine review  . Left Arm - Follow-up    HPI 52 year old male returns with persistent problems  with left shoulder pain triceps weakness now 3 weeks out.  Positive with work he cannot do a push-up.  MRI scan is been obtained and shows some anterolisthesis with facet arthropathy on the left with severe foraminal stenosis.  He also has paracentral disc protrusion at C6-7 on the left.  He has no grip weakness no interosseous weakness but has triceps and wrist flexion weakness.  No myelopathy no fever chills.  MRI from [facility] cervical images are available and reviewed with patient I gave him a copy of the report.  Review of Systems updated and unchanged from 02/25/2018 note 14 point system reviewed.   Objective: Vital Signs: BP (!) 147/94   Pulse 69   Ht 5\' 11"  (1.803 m)   Wt 185 lb (83.9 kg)   BMI 25.80 kg/m   Physical Exam Constitutional:      Appearance: He is well-developed.  HENT:     Head: Normocephalic and atraumatic.  Eyes:     Pupils: Pupils are equal, round, and reactive to light.  Neck:     Thyroid: No thyromegaly.     Trachea: No tracheal deviation.  Cardiovascular:     Rate and Rhythm: Normal rate.  Pulmonary:     Effort: Pulmonary effort is normal.     Breath sounds: No wheezing.  Abdominal:  General: Bowel sounds are normal.     Palpations: Abdomen is soft.  Skin:    General: Skin is warm and dry.     Capillary Refill: Capillary refill takes less than 2 seconds.  Neurological:     Mental Status: He is alert and oriented to person, place, and time.  Psychiatric:        Behavior: Behavior normal.        Thought Content: Thought content normal.        Judgment: Judgment normal.     Ortho Exam persistent triceps weakness absent or trace triceps reflex on the left normal on the right.  Finger extension is strong interossei and grip is strong.  No interosseous atrophy.  No biceps weakness right or left.  Positive Spurling on the left brachial plexus tenderness on the left.  No brachial plexus tenderness on the right positive on the left  positive Spurling on the left.  Lower extremity reflexes are normal with no clonus no hyperreflexia.  Specialty Comments:  No specialty comments available.  Imaging:  MRI scan read by  Inge Rise MD on 03/10/2018.  PMFS History: Patient Active Problem List   Diagnosis Date Noted  . Other spondylosis with radiculopathy, cervical region 03/05/2018  . Nausea without vomiting 11/21/2017  . GERD (gastroesophageal reflux disease) 08/22/2016  . IBS (irritable bowel syndrome) 11/23/2015  . History of colonic polyps 11/23/2015  . AP (abdominal pain)   . Diarrhea 08/22/2015  . Abdominal pain 08/22/2015  . HTN (hypertension) 08/18/2012  . Hyperlipidemia 08/18/2012   Past Medical History:  Diagnosis Date  . Hyperlipidemia   . Hypertension   . IBS (irritable bowel syndrome)    Diarrhea type    Family History  Problem Relation Age of Onset  . Heart disease Mother   . Heart disease Father   . Diabetes Brother   . Colon cancer Neg Hx     Past Surgical History:  Procedure Laterality Date  . BIOPSY  09/05/2015   Procedure: BIOPSY;  Surgeon: Danie Binder, MD;  Location: [facility];  Service: Endoscopy;;  random colon bx  . COLONOSCOPY N/A 09/05/2015   Dr. Oneida Alar: 4 simple adenomas removed.  Next colonoscopy 3 years.  Marland Kitchen KNEE SURGERY Right 1997   bursa sac  . POLYPECTOMY  09/05/2015   Procedure: POLYPECTOMY;  Surgeon: Danie Binder, MD;  Location: [facility];  Service: Endoscopy;;  colon polyps  . SHOULDER ARTHROSCOPY WITH BICEPS TENDON REPAIR Right 2011  . TONSILECTOMY, ADENOIDECTOMY, BILATERAL MYRINGOTOMY AND TUBES     Social History   Occupational History  . Not on file  Tobacco Use  . Smoking status: Former Smoker    Packs/day: 1.00    Years: 25.00    Pack years: 25.00    Types: Cigarettes  . Smokeless tobacco: Never Used  Substance and Sexual Activity  . Alcohol use: Yes    Alcohol/week: 12.0 standard drinks    Types: 12 Cans of beer per week    Comment: 12  cans of beer/week, mostly on weekends.  . Drug use: No  . Sexual activity: Not on file

## 2018-04-01 ENCOUNTER — Encounter (INDEPENDENT_AMBULATORY_CARE_PROVIDER_SITE_OTHER): Payer: Self-pay | Admitting: Physical Medicine and Rehabilitation

## 2018-04-01 ENCOUNTER — Ambulatory Visit (INDEPENDENT_AMBULATORY_CARE_PROVIDER_SITE_OTHER): Payer: 59 | Admitting: Physical Medicine and Rehabilitation

## 2018-04-01 DIAGNOSIS — R202 Paresthesia of skin: Secondary | ICD-10-CM

## 2018-04-01 DIAGNOSIS — M5412 Radiculopathy, cervical region: Secondary | ICD-10-CM

## 2018-04-01 DIAGNOSIS — R531 Weakness: Secondary | ICD-10-CM

## 2018-04-01 NOTE — Progress Notes (Signed)
 .  Numeric Pain Rating Scale and Functional Assessment Average Pain 4   In the last MONTH (on 0-10 scale) has pain interfered with the following?  1. General activity like being  able to carry out your everyday physical activities such as walking, climbing stairs, carrying groceries, or moving a chair?  Rating(6)

## 2018-04-02 ENCOUNTER — Ambulatory Visit: Payer: 59 | Admitting: Pediatrics

## 2018-04-02 NOTE — Progress Notes (Signed)
Seth Mcmillan - 53 y.o. male MRN [id number]  Date of birth: [birth year]  Office Visit Note: Visit Date: 04/01/2018 PCP: [name], MD Referred by: [name], MD  Subjective: Chief Complaint  Patient presents with  . Left Arm - Numbness, Weakness, Pain   HPI: Seth Mcmillan is a 53 y.o. male who comes in today At the request of Dr. Rodell Perna for electrodiagnostic study of the left upper limb.  Patient reports 3 to 4 weeks of worsening pain numbness and tingling and weakness of the left arm.  He has neck pain referring into the left arm with paresthesia.  Reports worsening with working and activity.  Denies any right-sided complaints.  He is right-hand dominant.  No prior electrodiagnostic studies.  Has cervical MRI showing severe foraminal narrowing with disc protrusion at C6-7 and foraminal narrowing severe at C7-T1.  Symptoms are somewhat nondermatomal into the hand as he feels like his whole hand gets numb.  No prior history of neck surgery or injury.  Symptoms were acute onset without specific cause.  Anti-inflammatories have helped to a mild degree.  Does feel some weakness with the arm grip strength.  ROS Otherwise per HPI.  Assessment & Plan: Visit Diagnoses:  1. Paresthesia of skin   2. Weakness   3. Cervical radiculopathy     Plan: Impression: The above electrodiagnostic study is ABNORMAL and reveals evidence of moderate acute C7 radiculopathy on the left.    There is no significant electrodiagnostic evidence of any other focal nerve entrapment or brachial plexopathy.   Recommendations: 1.  Follow-up with referring physician. 2.  Continue current management of symptoms. 3.  Suggest surgical evaluation.   Meds & Orders: No orders of the defined types were placed in this encounter.   Orders Placed This Encounter  Procedures  . NCV with EMG (electromyography)    Follow-up: Return for Rodell Perna, MD.   Procedures: No procedures performed  EMG & NCV  Findings: Evaluation of the left median motor nerve showed reduced amplitude (4.6 mV) and decreased conduction velocity (Elbow-Wrist, 48 m/s).  All remaining nerves (as indicated in the following tables) were within normal limits.    Needle evaluation of the left extensor digitorum communis and the left triceps muscles showed increased spontaneous activity, increased motor unit amplitude, and diminished recruitment.  All remaining muscles (as indicated in the following table) showed no evidence of electrical instability.    Impression: The above electrodiagnostic study is ABNORMAL and reveals evidence of moderate acute C7 radiculopathy on the left.    There is no significant electrodiagnostic evidence of any other focal nerve entrapment or brachial plexopathy.   Recommendations: 1.  Follow-up with referring physician. 2.  Continue current management of symptoms. 3.  Suggest surgical evaluation.  ___________________________ Laurence Spates FAAPMR Board Certified, American Board of Physical Medicine and Rehabilitation    Nerve Conduction Studies Anti Sensory Summary Table   Stim Site NR Peak (ms) Norm Peak (ms) P-T Amp (V) Norm P-T Amp Site1 Site2 Delta-P (ms) Dist (cm) Vel (m/s) Norm Vel (m/s)  Left Median Acr Palm Anti Sensory (2nd Digit)  32.8C  Wrist    3.4 <3.6 18.7 >10 Wrist Palm 1.7 0.0    Palm    1.7 <2.0 23.9         Left Radial Anti Sensory (Base 1st Digit)  33.7C  Wrist    2.0 <3.1 28.9  Wrist Base 1st Digit 2.0 0.0    Left Ulnar Anti  Sensory (5th Digit)  33.7C  Wrist    3.4 <3.7 19.7 >15.0 Wrist 5th Digit 3.4 14.0 41 >38   Motor Summary Table   Stim Site NR Onset (ms) Norm Onset (ms) O-P Amp (mV) Norm O-P Amp Site1 Site2 Delta-0 (ms) Dist (cm) Vel (m/s) Norm Vel (m/s)  Left Median Motor (Abd Poll Brev)  34C  Wrist    3.0 <4.2 *4.6 >5 Elbow Wrist 4.6 22.0 *48 >50  Elbow    7.6  1.1         Left Ulnar Motor (Abd Dig Min)  34C  Wrist    2.7 <4.2 7.6 >3 B Elbow Wrist  3.8 21.5 57 >53  B Elbow    6.5  7.3  A Elbow B Elbow 1.5 10.0 67 >53  A Elbow    8.0  7.1          EMG   Side Muscle Nerve Root Ins Act Fibs Psw Amp Dur Poly Recrt Int Fraser Din Comment  Left 1stDorInt Ulnar C8-T1 Nml Nml Nml Nml Nml 0 Nml Nml   Left Abd Poll Brev Median C8-T1 Nml Nml Nml Nml Nml 0 Nml Nml   Left ExtDigCom   Nml *3+ *3+ *Incr Nml 0 *Reduced Nml   Left Triceps Radial C6-7-8 Nml *3+ *3+ *Incr Nml 0 *Reduced Nml   Left Deltoid Axillary C5-6 Nml Nml Nml Nml Nml 0 Nml Nml   Left Biceps Musculocut C5-6 Nml Nml Nml Nml Nml 0 Nml Nml     Nerve Conduction Studies Anti Sensory Left/Right Comparison   Stim Site L Lat (ms) R Lat (ms) L-R Lat (ms) L Amp (V) R Amp (V) L-R Amp (%) Site1 Site2 L Vel (m/s) R Vel (m/s) L-R Vel (m/s)  Median Acr Palm Anti Sensory (2nd Digit)  32.8C  Wrist 3.4   18.7   Wrist Palm     Palm 1.7   23.9         Radial Anti Sensory (Base 1st Digit)  33.7C  Wrist 2.0   28.9   Wrist Base 1st Digit     Ulnar Anti Sensory (5th Digit)  33.7C  Wrist 3.4   19.7   Wrist 5th Digit 41     Motor Left/Right Comparison   Stim Site L Lat (ms) R Lat (ms) L-R Lat (ms) L Amp (mV) R Amp (mV) L-R Amp (%) Site1 Site2 L Vel (m/s) R Vel (m/s) L-R Vel (m/s)  Median Motor (Abd Poll Brev)  34C  Wrist 3.0   *4.6   Elbow Wrist *48    Elbow 7.6   1.1         Ulnar Motor (Abd Dig Min)  34C  Wrist 2.7   7.6   B Elbow Wrist 57    B Elbow 6.5   7.3   A Elbow B Elbow 67    A Elbow 8.0   7.1            Waveforms:             Clinical History: MRI CERVICAL SPINE WITHOUT CONTRAST  TECHNIQUE: Multiplanar, multisequence MR imaging of the cervical spine was performed. No intravenous contrast was administered.  COMPARISON: CT cervical spine 07/07/2009.  FINDINGS: Alignment: Straightening of lordosis is noted. Seth facet mediated anterolisthesis C7 on T1 is seen.  Vertebrae: No fracture or worrisome lesion. Schmorl's node in the superior endplate of C7 is identified.  Scattered degenerative endplate signal change is most notable at C6-7.  Cord: Normal signal throughout.  Posterior Fossa, vertebral arteries, paraspinal tissues: Negative.  Disc levels:  C2-3: Mild facet degenerative disease on the right and a very shallow disc bulge. No stenosis.  C3-4: There is loss of disc space height with a shallow bulge and uncovertebral disease, more notable on the left. The ventral thecal sac is narrowed but the central canal is open. Mild to moderate foraminal narrowing is worse on the left.  C4-5: Shallow disc bulge and uncovertebral disease on the left. There is mild to moderate left foraminal narrowing. The central canal and right foramen are open.  C5-6: Loss of disc space height with a shallow bulge and uncovertebral spurring. The ventral thecal sac is narrowed but not effaced. The central canal is open. Moderate to moderately severe foraminal narrowing is worse on the left.  C6-7: There is a left paracentral protrusion and uncovertebral disease on the left. The left side of the ventral thecal sac is narrowed but the central canal is open. Moderately severe left foraminal narrowing is present. Mild right foraminal narrowing is noted.  C7-T1: Left worse than right facet degenerative disease and uncovertebral spurring on the left. The left foramen is severely narrowed with encroachment on the exiting left C8 root. The central canal and right foramen are open.  IMPRESSION: Left worse than right facet degenerative disease and uncovertebral spurring on the left C7-T1 cause severe left foraminal narrowing and encroachment on the exiting left C8 root.  Uncovertebral disease on the left at C6-7 causes moderately severe foraminal narrowing.  Moderate to moderately severe bilateral foraminal narrowing at C5-6 due to uncovertebral disease is worse on the left.  Mild to moderate left foraminal narrowing at C4-5 due to uncovertebral disease.  Mild  to moderate bilateral foraminal narrowing at C3-4 is worse on the left.   Electronically Signed By: Inge Rise M.D. On: 03/10/2018 09:32   He reports that he has quit smoking. His smoking use included cigarettes. He has a 25.00 pack-year smoking history. He has never used smokeless tobacco. No results for input(s): HGBA1C, LABURIC in the last 8760 hours.  Objective:  VS:  HT:    WT:   BMI:     BP:   HR: bpm  TEMP: ( )  RESP:  Physical Exam Musculoskeletal:        General: No swelling, tenderness or deformity.     Comments: Inspection reveals no atrophy of the bilateral APB or FDI or hand intrinsics. There is no swelling, color changes, allodynia or dystrophic changes. There is 5 out of 5 strength in the  finger abduction and long finger flexion but 4+ with left wrist extension compared to right.  There is abnormal sensation somewhat nondermatomal labor more of a radicular pattern C6 or C7 distribution on the left. There is a negative Tinel's test at the bilateral wrist and elbow.  There is a negative Hoffmann's test bilaterally.  Skin:    General: Skin is warm and dry.     Findings: No erythema or rash.  Neurological:     Mental Status: He is alert and oriented to person, place, and time.     Motor: No abnormal muscle tone.     Coordination: Coordination normal.     Gait: Gait normal.  Psychiatric:        Mood and Affect: Mood normal.        Behavior: Behavior normal.     Ortho Exam Imaging: No results found.  Past Medical/Family/Surgical/Social History: Medications & Allergies reviewed  per EMR, new medications updated. Patient Active Problem List   Diagnosis Date Noted  . Protrusion of cervical intervertebral disc 03/12/2018  . Foraminal stenosis of cervical region 03/12/2018  . Other spondylosis with radiculopathy, cervical region 03/05/2018  . Nausea without vomiting 11/21/2017  . GERD (gastroesophageal reflux disease) 08/22/2016  . IBS (irritable bowel  syndrome) 11/23/2015  . History of colonic polyps 11/23/2015  . AP (abdominal pain)   . Diarrhea 08/22/2015  . Abdominal pain 08/22/2015  . HTN (hypertension) 08/18/2012  . Hyperlipidemia 08/18/2012   Past Medical History:  Diagnosis Date  . Hyperlipidemia   . Hypertension   . IBS (irritable bowel syndrome)    Diarrhea type   Family History  Problem Relation Age of Onset  . Heart disease Mother   . Heart disease Father   . Diabetes Brother   . Colon cancer Neg Hx    Past Surgical History:  Procedure Laterality Date  . BIOPSY  09/05/2015   Procedure: BIOPSY;  Surgeon: Danie Binder, MD;  Location[facility];  Service: Endoscopy;;  random colon bx  . COLONOSCOPY N/A 09/05/2015   Dr. Oneida Alar: 4 simple adenomas removed.  Next colonoscopy 3 years.  Marland Kitchen KNEE SURGERY Right 1997   bursa sac  . POLYPECTOMY  09/05/2015   Procedure: POLYPECTOMY;  Surgeon: Danie Binder, MD;  Location: [facility];  Service: Endoscopy;;  colon polyps  . SHOULDER ARTHROSCOPY WITH BICEPS TENDON REPAIR Right 2011  . TONSILECTOMY, ADENOIDECTOMY, BILATERAL MYRINGOTOMY AND TUBES     Social History   Occupational History  . Not on file  Tobacco Use  . Smoking status: Former Smoker    Packs/day: 1.00    Years: 25.00    Pack years: 25.00    Types: Cigarettes  . Smokeless tobacco: Never Used  Substance and Sexual Activity  . Alcohol use: Yes    Alcohol/week: 12.0 standard drinks    Types: 12 Cans of beer per week    Comment: 12 cans of beer/week, mostly on weekends.  . Drug use: No  . Sexual activity: Not on file

## 2018-04-02 NOTE — Procedures (Signed)
EMG & NCV Findings: Evaluation of the left median motor nerve showed reduced amplitude (4.6 mV) and decreased conduction velocity (Elbow-Wrist, 48 m/s).  All remaining nerves (as indicated in the following tables) were within normal limits.    Needle evaluation of the left extensor digitorum communis and the left triceps muscles showed increased spontaneous activity, increased motor unit amplitude, and diminished recruitment.  All remaining muscles (as indicated in the following table) showed no evidence of electrical instability.    Impression: The above electrodiagnostic study is ABNORMAL and reveals evidence of moderate acute C7 radiculopathy on the left.    There is no significant electrodiagnostic evidence of any other focal nerve entrapment or brachial plexopathy.   Recommendations: 1.  Follow-up with referring physician. 2.  Continue current management of symptoms. 3.  Suggest surgical evaluation.  ___________________________ Laurence Spates FAAPMR Board Certified, American Board of Physical Medicine and Rehabilitation    Nerve Conduction Studies Anti Sensory Summary Table   Stim Site NR Peak (ms) Norm Peak (ms) P-T Amp (V) Norm P-T Amp Site1 Site2 Delta-P (ms) Dist (cm) Vel (m/s) Norm Vel (m/s)  Left Median Acr Palm Anti Sensory (2nd Digit)  32.8C  Wrist    3.4 <3.6 18.7 >10 Wrist Palm 1.7 0.0    Palm    1.7 <2.0 23.9         Left Radial Anti Sensory (Base 1st Digit)  33.7C  Wrist    2.0 <3.1 28.9  Wrist Base 1st Digit 2.0 0.0    Left Ulnar Anti Sensory (5th Digit)  33.7C  Wrist    3.4 <3.7 19.7 >15.0 Wrist 5th Digit 3.4 14.0 41 >38   Motor Summary Table   Stim Site NR Onset (ms) Norm Onset (ms) O-P Amp (mV) Norm O-P Amp Site1 Site2 Delta-0 (ms) Dist (cm) Vel (m/s) Norm Vel (m/s)  Left Median Motor (Abd Poll Brev)  34C  Wrist    3.0 <4.2 *4.6 >5 Elbow Wrist 4.6 22.0 *48 >50  Elbow    7.6  1.1         Left Ulnar Motor (Abd Dig Min)  34C  Wrist    2.7 <4.2 7.6 >3 B  Elbow Wrist 3.8 21.5 57 >53  B Elbow    6.5  7.3  A Elbow B Elbow 1.5 10.0 67 >53  A Elbow    8.0  7.1          EMG   Side Muscle Nerve Root Ins Act Fibs Psw Amp Dur Poly Recrt Int Fraser Din Comment  Left 1stDorInt Ulnar C8-T1 Nml Nml Nml Nml Nml 0 Nml Nml   Left Abd Poll Brev Median C8-T1 Nml Nml Nml Nml Nml 0 Nml Nml   Left ExtDigCom   Nml *3+ *3+ *Incr Nml 0 *Reduced Nml   Left Triceps Radial C6-7-8 Nml *3+ *3+ *Incr Nml 0 *Reduced Nml   Left Deltoid Axillary C5-6 Nml Nml Nml Nml Nml 0 Nml Nml   Left Biceps Musculocut C5-6 Nml Nml Nml Nml Nml 0 Nml Nml     Nerve Conduction Studies Anti Sensory Left/Right Comparison   Stim Site L Lat (ms) R Lat (ms) L-R Lat (ms) L Amp (V) R Amp (V) L-R Amp (%) Site1 Site2 L Vel (m/s) R Vel (m/s) L-R Vel (m/s)  Median Acr Palm Anti Sensory (2nd Digit)  32.8C  Wrist 3.4   18.7   Wrist Palm     Palm 1.7   23.9  Radial Anti Sensory (Base 1st Digit)  33.7C  Wrist 2.0   28.9   Wrist Base 1st Digit     Ulnar Anti Sensory (5th Digit)  33.7C  Wrist 3.4   19.7   Wrist 5th Digit 41     Motor Left/Right Comparison   Stim Site L Lat (ms) R Lat (ms) L-R Lat (ms) L Amp (mV) R Amp (mV) L-R Amp (%) Site1 Site2 L Vel (m/s) R Vel (m/s) L-R Vel (m/s)  Median Motor (Abd Poll Brev)  34C  Wrist 3.0   *4.6   Elbow Wrist *48    Elbow 7.6   1.1         Ulnar Motor (Abd Dig Min)  34C  Wrist 2.7   7.6   B Elbow Wrist 57    B Elbow 6.5   7.3   A Elbow B Elbow 67    A Elbow 8.0   7.1            Waveforms:

## 2018-04-09 ENCOUNTER — Encounter (INDEPENDENT_AMBULATORY_CARE_PROVIDER_SITE_OTHER): Payer: Self-pay | Admitting: Orthopaedic Surgery

## 2018-04-09 ENCOUNTER — Ambulatory Visit (INDEPENDENT_AMBULATORY_CARE_PROVIDER_SITE_OTHER): Payer: 59 | Admitting: Orthopaedic Surgery

## 2018-04-09 VITALS — BP 134/87 | HR 72 | Ht 71.0 in | Wt 185.0 lb

## 2018-04-09 DIAGNOSIS — M5020 Other cervical disc displacement, unspecified cervical region: Secondary | ICD-10-CM

## 2018-04-09 DIAGNOSIS — M4722 Other spondylosis with radiculopathy, cervical region: Secondary | ICD-10-CM | POA: Diagnosis not present

## 2018-04-09 DIAGNOSIS — M50223 Other cervical disc displacement at C6-C7 level: Secondary | ICD-10-CM

## 2018-04-09 DIAGNOSIS — M4802 Spinal stenosis, cervical region: Secondary | ICD-10-CM | POA: Diagnosis not present

## 2018-04-09 NOTE — Progress Notes (Signed)
Office Visit Note   Patient: Seth Mcmillan           Date of Birth: May 18, 1965           MRN: [id number] Visit Date: 04/09/2018              Requested by: [name], [place], [place] [address] PCP: [name], MD   Assessment & Plan: Visit Diagnoses:  1. Other spondylosis with radiculopathy, cervical region   2. Protrusion of cervical intervertebral disc   3. Foraminal stenosis of cervical region     Plan: Patient's had resistive and progressive arm weakness now for 3 months not responding to prednisone Dosepak exercise program anti-inflammatories.  Electrical test shows moderate acute left C7 radiculopathy on the left.  He also has C8 severe foraminal stenosis.  Plan would be C6-7, C7-T1 anterior cervical discectomy and fusion, allograft and plate.  Observation status overnight stay, soft collar for 6 weeks.  General anesthesia discussed risks of surgery discussed.  He understands he be out of work for 6 weeks unless he can do sitting work with his collar on if he could get a ride to work.  6 7 weeks after the procedure he should be able to resume all work activities.  Questions were elicited and answered he states he is concerned about his progressive arm weakness and increasing pain and would like to proceed.  Questions were elicited and answered.  Follow-Up Instructions: No follow-ups on file.   Orders:  No orders of the defined types were placed in this encounter.  No orders of the defined types were placed in this encounter.     Procedures: No procedures performed   Clinical Data: No additional findings.   Subjective: Chief Complaint  Patient presents with  . Neck - Follow-up    EMG/NCS Review    HPI 53 year old male returns with persistent problems with neck pain radiating to the left arm with left arm weakness triceps and finger extension.  Progressive left arm weakness for 3 months.  He denies any gait disturbance or lower extremity  weakness no problems with stairs.  Increase pain with rotation of his neck.  Review of Systems 14 point systems positive for spondylosis disc protrusion with radiculopathy.  History of IBS, hypertension, GERD, hyperlipidemia.  Knee arthroscopy 2000.  Previous shoulder surgery 2009.  Past smoker current non-smoker.  Otherwise negative is a pertains to HPI.   Objective: Vital Signs: BP 134/87   Pulse 72   Ht 5\' 11"  (1.803 m)   Wt 185 lb (83.9 kg)   BMI 25.80 kg/m   Physical Exam Constitutional:      Appearance: He is well-developed.  HENT:     Head: Normocephalic and atraumatic.  Eyes:     Pupils: Pupils are equal, round, and reactive to light.  Neck:     Thyroid: No thyromegaly.     Trachea: No tracheal deviation.  Cardiovascular:     Rate and Rhythm: Normal rate.  Pulmonary:     Effort: Pulmonary effort is normal.     Breath sounds: No wheezing.  Abdominal:     General: Bowel sounds are normal.     Palpations: Abdomen is soft.  Skin:    General: Skin is warm and dry.     Capillary Refill: Capillary refill takes less than 2 seconds.  Neurological:     Mental Status: He is alert and oriented to person, place, and time.  Psychiatric:  Behavior: Behavior normal.        Thought Content: Thought content normal.        Judgment: Judgment normal.     Ortho Exam patient has significant left triceps weakness with atrophy absent triceps reflex on the left 2+ on the right.  Weakness finger extension wrist flexion on the left.  No interossei weakness or atrophy good grip strength.  Normal biceps.  Internal and external rotation of the shoulder right and left is normal.  Wrist pulses are normal.  Severe brachial plexus tenderness on the left positive Spurling on the left.  Normal lower extremity reflexes no lower extremity clonus no hyperreflexia in the lower extremities.  Specialty Comments:  No specialty comments available.  Imaging:   Signed         Show:Clear  all [] Manual[] Template[x] Copied  Added by: [x] Magnus Sinning, MD  [] Hover for details EMG & NCV Findings: Evaluation of the left median motor nerve showed reduced amplitude (4.6 mV) and decreased conduction velocity (Elbow-Wrist, 48 m/s).  All remaining nerves (as indicated in the following tables) were within normal limits.    Needle evaluation of the left extensor digitorum communis and the left triceps muscles showed increased spontaneous activity, increased motor unit amplitude, and diminished recruitment.  All remaining muscles (as indicated in the following table) showed no evidence of electrical instability.    Impression: The above electrodiagnostic study is ABNORMAL and reveals evidence of moderate acute C7 radiculopathy on the left.    There is no significant electrodiagnostic evidence of any other focal nerve entrapment or brachial plexopathy.   Recommendations: 1.  Follow-up with referring physician. 2.  Continue current management of symptoms. 3.  Suggest surgical evaluation.  ___________________________ Laurence Spates FAAPMR Board Certified, American Board of Physical Medicine and Rehabilitation          Cervical MRI scan showed left worse than right facet degenerative changes uncovertebral spurring on the left at C7-T1 causing severe left foraminal narrowing and encroachment on the exiting left C8 root.  Uncovertebral disease on the left at C6-7 causing moderately severe foraminal narrowing.  Moderate foraminal narrowing at C5-6.  Mild to moderate narrowing at C4-5.  Mild to moderate narrowing at C3-4.   PMFS History: Patient Active Problem List   Diagnosis Date Noted  . Protrusion of cervical intervertebral disc 03/12/2018  . Foraminal stenosis of cervical region 03/12/2018  . Other spondylosis with radiculopathy, cervical region 03/05/2018  . Nausea without vomiting 11/21/2017  . GERD (gastroesophageal reflux disease) 08/22/2016  . IBS (irritable bowel  syndrome) 11/23/2015  . History of colonic polyps 11/23/2015  . AP (abdominal pain)   . Diarrhea 08/22/2015  . Abdominal pain 08/22/2015  . HTN (hypertension) 08/18/2012  . Hyperlipidemia 08/18/2012   Past Medical History:  Diagnosis Date  . Hyperlipidemia   . Hypertension   . IBS (irritable bowel syndrome)    Diarrhea type    Family History  Problem Relation Age of Onset  . Heart disease Mother   . Heart disease Father   . Diabetes Brother   . Colon cancer Neg Hx     Past Surgical History:  Procedure Laterality Date  . BIOPSY  09/05/2015   Procedure: BIOPSY;  Surgeon: Danie Binder, MD;  Location: [facility];  Service: Endoscopy;;  random colon bx  . COLONOSCOPY N/A 09/05/2015   Dr. Oneida Alar: 4 simple adenomas removed.  Next colonoscopy 3 years.  Marland Kitchen KNEE SURGERY Right 1997   bursa sac  . POLYPECTOMY  09/05/2015  Procedure: POLYPECTOMY;  Surgeon: Danie Binder, MD;  Location: [facility];  Service: Endoscopy;;  colon polyps  . SHOULDER ARTHROSCOPY WITH BICEPS TENDON REPAIR Right 2011  . TONSILECTOMY, ADENOIDECTOMY, BILATERAL MYRINGOTOMY AND TUBES     Social History   Occupational History  . Not on file  Tobacco Use  . Smoking status: Former Smoker    Packs/day: 1.00    Years: 25.00    Pack years: 25.00    Types: Cigarettes  . Smokeless tobacco: Never Used  Substance and Sexual Activity  . Alcohol use: Yes    Alcohol/week: 12.0 standard drinks    Types: 12 Cans of beer per week    Comment: 12 cans of beer/week, mostly on weekends.  . Drug use: No  . Sexual activity: Not on file

## 2018-04-10 ENCOUNTER — Encounter (INDEPENDENT_AMBULATORY_CARE_PROVIDER_SITE_OTHER): Payer: Self-pay | Admitting: Orthopaedic Surgery

## 2018-04-13 ENCOUNTER — Ambulatory Visit: Payer: 59 | Admitting: Pediatrics

## 2018-04-14 ENCOUNTER — Ambulatory Visit: Payer: 59 | Admitting: Family Medicine

## 2018-04-14 ENCOUNTER — Other Ambulatory Visit: Payer: Self-pay | Admitting: Family

## 2018-04-14 ENCOUNTER — Encounter: Payer: Self-pay | Admitting: Family Medicine

## 2018-04-14 VITALS — BP 137/82 | HR 68 | Temp 97.3°F | Ht 71.0 in | Wt 195.0 lb

## 2018-04-14 DIAGNOSIS — F4322 Adjustment disorder with anxiety: Secondary | ICD-10-CM | POA: Diagnosis not present

## 2018-04-14 DIAGNOSIS — K219 Gastro-esophageal reflux disease without esophagitis: Secondary | ICD-10-CM

## 2018-04-14 DIAGNOSIS — M4722 Other spondylosis with radiculopathy, cervical region: Secondary | ICD-10-CM

## 2018-04-14 DIAGNOSIS — Z01818 Encounter for other preprocedural examination: Secondary | ICD-10-CM

## 2018-04-14 DIAGNOSIS — I10 Essential (primary) hypertension: Secondary | ICD-10-CM

## 2018-04-14 DIAGNOSIS — E782 Mixed hyperlipidemia: Secondary | ICD-10-CM | POA: Diagnosis not present

## 2018-04-14 MED ORDER — AMLODIPINE BESYLATE 5 MG PO TABS: 5.0000 mg | ORAL_TABLET | Freq: Every day | ORAL | 1 refills | Status: DC

## 2018-04-14 MED ORDER — OMEPRAZOLE 40 MG PO CPDR: 40.0000 mg | DELAYED_RELEASE_CAPSULE | Freq: Every day | ORAL | 3 refills | Status: DC

## 2018-04-14 MED ORDER — ESCITALOPRAM OXALATE 10 MG PO TABS: 10.0000 mg | ORAL_TABLET | Freq: Every day | ORAL | 3 refills | Status: DC

## 2018-04-14 NOTE — Patient Instructions (Addendum)
See me in 6 months unless you need me sooner.  You will be due for fasting labs at our next visit.

## 2018-04-14 NOTE — Progress Notes (Addendum)
Subjective: CC: HTN, HLD, GAD PCP: [name], MD WLS:LHTD H Seth is a 53 y.o. Mcmillan presenting to clinic today for:  1. HTN, HLD Patient reports compliance with Crestor 40 mg daily, Norvasc 5 mg daily.  Denies any chest pain, shortness of breath, lower extremity edema or dizziness.  He is a former smoker and quit 1 year ago.  He continues to drink alcohol but to lesser extent of previous.  He also has cleaned up his diet with the help of his spouse.  2. GERD Reports fairly good control of acid reflux symptoms with omeprazole 40 mg daily.  He is also on verbirzi that is prescribed by GI for IBS.  He has follow-up with them soon.  Denies any rectal bleeding, nausea or vomiting.  3. GAD States that anxiety disorder is secondary to psychosocial stressors.  He has a mother who is demented and a brother who is very ill that lives near his home.  He feels a lot of pressure with relation to these issues.  He reports good control of symptoms with Lexapro 10 mg daily and states that he is tolerating medication without difficulty.  No SI or HI.  Continues to drink alcohol but notes that this is a decrease from previous.  4.  Spondylosis of the cervical region with associated radiculopathy to the left upper extremity Patient here for preop evaluation.  He has plans for cervical intervention.  He notes he is under the care of Dr. Lorin Mercy and recently had nerve conduction studies in the left upper extremity.  There was nerve damage noted.  He denies chest pain, shortness of breath as above.  He uses naproxen as needed neck pain.  He continues to have radicular symptoms along the left upper extremity.  1) High Risk Cardiac Conditions  1) Recent MI - No.  2) Decompensated Heart Failure - No.  3) Unstable angina - No.  4) Symptomatic arrythmia - No.  5) Sx Valvular Disease - No.  2) Intermediate Risk Factors - DM, CKD, CVA, CHF, CAD - No.  2) Functional Status - > 4 mets (Walk, run, climb stairs)  Yes.  Rob Hickman Activity Status Index: 58.2  3) Surgery Specific Risk -  Intermediate (Carotid, Head and Neck, Orthopaedic )         4) Further Noninvasive evaluation -   1) EKG - No.   1) Hx of CVA, CAD, DM, CKD  2) Echo - No.   1) Worsening dyspnea   3) Stress Testing - Active Cardiac Disease - No.  5) Need for medical therapy - Beta Blocker, Statins indicated ? No.  ROS: Per HPI  No Known Allergies Past Medical History:  Diagnosis Date  . Hyperlipidemia   . Hypertension   . IBS (irritable bowel syndrome)    Diarrhea type    Current Outpatient Medications:  .  amLODipine (NORVASC) 5 MG tablet, Take 1 tablet (5 mg total) by mouth daily., Disp: 90 tablet, Rfl: 1 .  Eluxadoline (VIBERZI) 100 MG TABS, Take 1 tablet (100 mg total) by mouth 2 (two) times daily. With food., Disp: 60 tablet, Rfl: 5 .  escitalopram (LEXAPRO) 10 MG tablet, Take 10 mg by mouth daily., Disp: , Rfl:  .  naproxen (NAPROSYN) 500 MG tablet, Take 1 tablet (500 mg total) by mouth 2 (two) times daily with a meal., Disp: 30 tablet, Rfl: 1 .  omeprazole (PRILOSEC) 40 MG capsule, Take 1 capsule (40 mg total) by mouth daily., Disp:  90 capsule, Rfl: 3 .  rosuvastatin (CRESTOR) 40 MG tablet, Take 1 tablet (40 mg total) by mouth daily., Disp: 90 tablet, Rfl: 3 Social History   Socioeconomic History  . Marital status: Divorced    Spouse name: Not on file  . Number of children: Not on file  . Years of education: Not on file  . Highest education level: Not on file  Occupational History  . Not on file  Social Needs  . Financial resource strain: Not on file  . Food insecurity:    Worry: Not on file    Inability: Not on file  . Transportation needs:    Medical: Not on file    Non-medical: Not on file  Tobacco Use  . Smoking status: Former Smoker    Packs/day: 1.00    Years: 25.00    Pack years: 25.00    Types: Cigarettes  . Smokeless tobacco: Never Used  Substance and Sexual Activity  . Alcohol use: Yes     Alcohol/week: 12.0 standard drinks    Types: 12 Cans of beer per week    Comment: 12 cans of beer/week, mostly on weekends.  . Drug use: No  . Sexual activity: Not on file  Lifestyle  . Physical activity:    Days per week: Not on file    Minutes per session: Not on file  . Stress: Not on file  Relationships  . Social connections:    Talks on phone: Not on file    Gets together: Not on file    Attends religious service: Not on file    Active member of club or organization: Not on file    Attends meetings of clubs or organizations: Not on file    Relationship status: Not on file  . Intimate partner violence:    Fear of current or ex partner: Not on file    Emotionally abused: Not on file    Physically abused: Not on file    Forced sexual activity: Not on file  Other Topics Concern  . Not on file  Social History Narrative  . Not on file   Family History  Problem Relation Age of Onset  . Heart disease Mother   . Heart disease Father   . Diabetes Brother   . Colon cancer Neg Hx     Objective: Office vital signs reviewed. BP 137/82   Pulse 68   Temp (!) 97.3 F (36.3 C) (Oral)   Ht 5\' 11"  (1.803 m)   Wt 195 lb (88.5 kg)   BMI 27.20 kg/m   Physical Examination:  General: Awake, alert, well nourished, No acute distress HEENT: Normal, sclera white, MMM, Mallampati 2    Neck: No masses palpated. No lymphadenopathy Cardio: regular rate and rhythm, S1S2 heard, no murmurs appreciated Pulm: clear to auscultation bilaterally, no wheezes, rhonchi or rales; normal work of breathing on room air Extremities: warm, well perfused, No edema, cyanosis or clubbing; +2 pulses bilaterally MSK: normal gait and station  Cervical spine: Patient has full active range of motion in extension and flexion.  He does have some pain with rotation to the right and left. Psych: Mood stable, speech normal, after appropriate, pleasant interactive Depression screen [facility] 2/9 04/14/2018 02/25/2018  10/01/2017 03/20/2017 09/16/2016  Decreased Interest 0 0 0 0 0  Down, Depressed, Hopeless 0 0 0 0 0  PHQ - 2 Score 0 0 0 0 0  Altered sleeping 0 - - - -  Tired, decreased energy 0 - - - -  Change in appetite 0 - - - -  Feeling bad or failure about yourself  0 - - - -  Trouble concentrating 0 - - - -  Moving slowly or fidgety/restless 0 - - - -  Suicidal thoughts 0 - - - -  PHQ-9 Score 0 - - - -   GAD 7 : Generalized Anxiety Score 04/14/2018  Nervous, Anxious, on Edge 0  Control/stop worrying 0  Worry too much - different things 1  Trouble relaxing 1  Restless 0  Easily annoyed or irritable 1  Afraid - awful might happen 0  Total GAD 7 Score 3  Anxiety Difficulty Not difficult at all   Assessment/ Plan: 53 y.o. Mcmillan   1. Essential hypertension Under excellent control with Norvasc 5 mg daily.  Continue current regimen.  Refills sent - amLODipine (NORVASC) 5 MG tablet; Take 1 tablet (5 mg total) by mouth daily.  Dispense: 90 tablet; Refill: 1  2. Mixed hyperlipidemia Due for fasting lipid panel in July.  Continue Crestor  3. Gastroesophageal reflux disease without esophagitis Omeprazole refilled.  Follow-up with GI as scheduled - omeprazole (PRILOSEC) 40 MG capsule; Take 1 capsule (40 mg total) by mouth daily.  Dispense: 90 capsule; Refill: 3  4. Adjustment disorder with anxious mood Doing well.  Continue Lexapro 10 mg daily.  5. Pre-op exam I have independently evaluated patient.  KODI STEIL is a 53 y.o. Mcmillan who is low risk for an intermediate risk surgery.  There are modifiable risk factors (weight reduction, alcohol cessation). Darletta Moll Angulo's RCRI/NSQIP calculation for MACE is: 0 (3.9%).     No orders of the defined types were placed in this encounter.  Meds ordered this encounter  Medications  . amLODipine (NORVASC) 5 MG tablet    Sig: Take 1 tablet (5 mg total) by mouth daily.    Dispense:  90 tablet    Refill:  1  . omeprazole (PRILOSEC) 40 MG capsule    Sig:  Take 1 capsule (40 mg total) by mouth daily.    Dispense:  90 capsule    Refill:  3  . escitalopram (LEXAPRO) 10 MG tablet    Sig: Take 1 tablet (10 mg total) by mouth daily.    Dispense:  90 tablet    Refill:  [place], [place] [phone]

## 2018-04-16 ENCOUNTER — Ambulatory Visit (INDEPENDENT_AMBULATORY_CARE_PROVIDER_SITE_OTHER): Payer: 59 | Admitting: Surgery

## 2018-04-16 ENCOUNTER — Other Ambulatory Visit: Payer: Self-pay | Admitting: Gastroenterology

## 2018-04-16 ENCOUNTER — Encounter (INDEPENDENT_AMBULATORY_CARE_PROVIDER_SITE_OTHER): Payer: Self-pay | Admitting: Surgery

## 2018-04-16 VITALS — BP 140/89 | HR 94

## 2018-04-16 DIAGNOSIS — M4722 Other spondylosis with radiculopathy, cervical region: Secondary | ICD-10-CM

## 2018-04-16 DIAGNOSIS — M5020 Other cervical disc displacement, unspecified cervical region: Secondary | ICD-10-CM

## 2018-04-16 NOTE — Progress Notes (Signed)
53 year old white male history of left C6-7 and C7-T1 HNP/stenosis comes in for preop evaluation.  Continues to have ongoing neck pain with left upper extremity radiculopathy and left arm weakness that is unchanged since last office visit.  He is want to proceed with C6-7 and C7-T1 ACDF as scheduled.  Patient's job is very physically demanding and I did give him a note for work anticipating that he will be out at least 6 to 12 weeks postop.  Surgical procedure discussed in great detail and all questions were answered.  Cervical collar at least 6 weeks postop.

## 2018-04-23 ENCOUNTER — Encounter: Payer: Self-pay | Admitting: *Deleted

## 2018-04-23 ENCOUNTER — Other Ambulatory Visit: Payer: Self-pay | Admitting: *Deleted

## 2018-04-23 DIAGNOSIS — K760 Fatty (change of) liver, not elsewhere classified: Secondary | ICD-10-CM

## 2018-05-07 NOTE — Pre-Procedure Instructions (Signed)
Seth Mcmillan  05/07/2018      [name] [address], [place] [address] Phone: [phone] Fax: [phone]    Your procedure is scheduled on May 18, 2018.  Report to [facility] Entrance "A" at 1030 AM.  Call this number if you have problems the morning of surgery:  [phone]   Remember:  Do not eat or drink after midnight.    Take these medicines the morning of surgery with A SIP OF WATER  Amlodipine (norvasc) Omeprazole (prilosec) Escitalopram (lexapro) Viberzi  Follow your surgeon's instructions on when to hold/resume aspirin.  If no instructions were given call the office to determine how they would like to you take aspirin  7 days prior to surgery STOP taking any Aleve, Naproxen, Ibuprofen, Motrin, Advil, Goody's, BC's, all herbal medications, fish oil, and all vitamins    Do not wear jewelry  Do not wear lotions, powders, or colognes, or deodorant.  Men may shave face and neck.  Do not bring valuables to the hospital.  [facility] is not responsible for any belongings or valuables.  Contacts, dentures or bridgework may not be worn into surgery.  Leave your suitcase in the car.  After surgery it may be brought to your room.  For patients admitted to the hospital, discharge time will be determined by your treatment team.  Patients discharged the day of surgery will not be allowed to drive home.    [facility]- Preparing For Surgery  Before surgery, you can play an important role. Because skin is not sterile, your skin needs to be as free of germs as possible. You can reduce the number of germs on your skin by washing with CHG (chlorahexidine gluconate) Soap before surgery.  CHG is an antiseptic cleaner which kills germs and bonds with the skin to continue killing germs even after washing.    Oral Hygiene is also important to reduce your risk of infection.  Remember - BRUSH YOUR TEETH THE MORNING OF SURGERY WITH YOUR  REGULAR TOOTHPASTE  Please do not use if you have an allergy to CHG or antibacterial soaps. If your skin becomes reddened/irritated stop using the CHG.  Do not shave (including legs and underarms) for at least 48 hours prior to first CHG shower. It is OK to shave your face.  Please follow these instructions carefully.   1. Shower the NIGHT BEFORE SURGERY and the MORNING OF SURGERY with CHG.   2. If you chose to wash your hair, wash your hair first as usual with your normal shampoo.  3. After you shampoo, rinse your hair and body thoroughly to remove the shampoo.  4. Use CHG as you would any other liquid soap. You can apply CHG directly to the skin and wash gently with a scrungie or a clean washcloth.   5. Apply the CHG Soap to your body ONLY FROM THE NECK DOWN.  Do not use on open wounds or open sores. Avoid contact with your eyes, ears, mouth and genitals (private parts). Wash Face and genitals (private parts)  with your normal soap.  6. Wash thoroughly, paying special attention to the area where your surgery will be performed.  7. Thoroughly rinse your body with warm water from the neck down.  8. DO NOT shower/wash with your normal soap after using and rinsing off the CHG Soap.  9. Pat yourself dry with a CLEAN TOWEL.  10. Wear CLEAN PAJAMAS  to bed the night before surgery, wear comfortable clothes the morning of surgery  11. Place CLEAN SHEETS on your bed the night of your first shower and DO NOT SLEEP WITH PETS.  Day of Surgery:  Do not apply any deodorants/lotions.  Please wear clean clothes to the hospital/surgery center.   Remember to brush your teeth WITH YOUR REGULAR TOOTHPASTE.   Please read over the following fact sheets that you were given.

## 2018-05-08 ENCOUNTER — Other Ambulatory Visit: Payer: Self-pay

## 2018-05-08 ENCOUNTER — Encounter (HOSPITAL_COMMUNITY): Payer: Self-pay

## 2018-05-08 ENCOUNTER — Encounter (HOSPITAL_COMMUNITY)
Admission: RE | Admit: 2018-05-08 | Discharge: 2018-05-08 | Disposition: A | Discharge status: Home / Self Care | Payer: 59 | Source: Ambulatory Visit | Attending: Surgery | Admitting: Surgery

## 2018-05-08 ENCOUNTER — Encounter (HOSPITAL_COMMUNITY)
Admission: RE | Admit: 2018-05-08 | Discharge: 2018-05-08 | Disposition: A | Discharge status: Home / Self Care | Payer: 59 | Source: Ambulatory Visit | Attending: Orthopaedic Surgery | Admitting: Orthopaedic Surgery

## 2018-05-08 DIAGNOSIS — Z01811 Encounter for preprocedural respiratory examination: Secondary | ICD-10-CM | POA: Diagnosis not present

## 2018-05-08 DIAGNOSIS — Z01818 Encounter for other preprocedural examination: Secondary | ICD-10-CM | POA: Insufficient documentation

## 2018-05-08 DIAGNOSIS — I498 Other specified cardiac arrhythmias: Secondary | ICD-10-CM | POA: Diagnosis not present

## 2018-05-08 HISTORY — DX: Depression, unspecified: F32.A

## 2018-05-08 HISTORY — DX: Major depressive disorder, single episode, unspecified: F32.9

## 2018-05-08 HISTORY — DX: Gastro-esophageal reflux disease without esophagitis: K21.9

## 2018-05-08 HISTORY — DX: Fatty (change of) liver, not elsewhere classified: K76.0

## 2018-05-08 HISTORY — DX: Anxiety disorder, unspecified: F41.9

## 2018-05-08 HISTORY — DX: Unspecified osteoarthritis, unspecified site: M19.90

## 2018-05-08 LAB — COMPREHENSIVE METABOLIC PANEL
ALK PHOS: 51 U/L (ref 38–126)
ALT: 66 U/L — AB (ref 0–44)
AST: 56 U/L — AB (ref 15–41)
Albumin: 4 g/dL (ref 3.5–5.0)
Anion gap: 11 (ref 5–15)
BUN: 14 mg/dL (ref 6–20)
CALCIUM: 9 mg/dL (ref 8.9–10.3)
CO2: 21 mmol/L — ABNORMAL LOW (ref 22–32)
CREATININE: 1.13 mg/dL (ref 0.61–1.24)
Chloride: 105 mmol/L (ref 98–111)
GFR calc Af Amer: >60 mL/min (ref >60)
GFR calc non Af Amer: >60 mL/min (ref >60)
Glucose, Bld: 97 mg/dL (ref 70–99)
Potassium: 4.2 mmol/L (ref 3.5–5.1)
Sodium: 137 mmol/L (ref 135–145)
Total Bilirubin: 1 mg/dL (ref 0.3–1.2)
Total Protein: 6.6 g/dL (ref 6.5–8.1)

## 2018-05-08 LAB — CBC
HCT: 41 % (ref 39.0–52.0)
Hemoglobin: 13.6 g/dL (ref 13.0–17.0)
MCH: 30.6 pg (ref 26.0–34.0)
MCHC: 33.2 g/dL (ref 30.0–36.0)
MCV: 92.3 fL (ref 80.0–100.0)
NRBC: 0 % (ref 0.0–0.2)
Platelets: 221 10*3/uL (ref 150–400)
RBC: 4.44 MIL/uL (ref 4.22–5.81)
RDW: 12.9 % (ref 11.5–15.5)
WBC: 5.2 10*3/uL (ref 4.0–10.5)

## 2018-05-08 LAB — URINALYSIS, ROUTINE W REFLEX MICROSCOPIC
Bilirubin Urine: NEGATIVE
Glucose, UA: NEGATIVE mg/dL
Hgb urine dipstick: NEGATIVE
KETONES UR: NEGATIVE mg/dL
Leukocytes,Ua: NEGATIVE
Nitrite: NEGATIVE
Protein, ur: NEGATIVE mg/dL
Specific Gravity, Urine: 1.015 (ref 1.005–1.030)
pH: 7 (ref 5.0–8.0)

## 2018-05-08 LAB — SURGICAL PCR SCREEN
MRSA, PCR: NEGATIVE
Staphylococcus aureus: POSITIVE — AB

## 2018-05-08 NOTE — Progress Notes (Signed)
I called a prescription for Mupirocin ointment to [place], [facility]

## 2018-05-08 NOTE — Progress Notes (Addendum)
PCP -Ronnie Doss MD  Cardiologist - none  Chest x-ray - 05-08-2018 EKG -05-08-2018  Stress Test - 20 years ago ECHO denies-  Cardiac Cath - denies  Sleep Study - no CPAP -   Fasting Blood Sugar - n/a Checks Blood Sugar _____ times a day  Blood Thinner Instructions:n/a Aspirin Instructions: call office for instructions on stopping and resuming ASA   Anesthesia review: no  Patient denies shortness of breath, fever, cough and chest pain at PAT appointment   Patient verbalized understanding of instructions that were given to them at the PAT appointment. Patient was also instructed that they will need to review over the PAT instructions again at home before surgery.

## 2018-05-15 ENCOUNTER — Telehealth (INDEPENDENT_AMBULATORY_CARE_PROVIDER_SITE_OTHER): Payer: Self-pay | Admitting: Radiology

## 2018-05-15 NOTE — Telephone Encounter (Signed)
Patient brought disability paperwork in to [place] office to be completed and faxed. Form faxed to [phone].

## 2018-05-18 ENCOUNTER — Observation Stay (HOSPITAL_COMMUNITY)
Admission: RE | Admit: 2018-05-18 | Discharge: 2018-05-19 | Disposition: A | Discharge status: Home / Self Care | Payer: 59 | Attending: Orthopaedic Surgery | Admitting: Orthopaedic Surgery

## 2018-05-18 ENCOUNTER — Ambulatory Visit (HOSPITAL_COMMUNITY): Payer: 59 | Admitting: Certified Registered"

## 2018-05-18 ENCOUNTER — Encounter (HOSPITAL_COMMUNITY): Payer: Self-pay | Admitting: *Deleted

## 2018-05-18 ENCOUNTER — Ambulatory Visit (HOSPITAL_COMMUNITY): Payer: 59

## 2018-05-18 ENCOUNTER — Other Ambulatory Visit: Payer: Self-pay

## 2018-05-18 ENCOUNTER — Encounter (HOSPITAL_COMMUNITY)
Admission: RE | Disposition: A | Discharge status: Home / Self Care | Payer: Self-pay | Source: Home / Self Care | Attending: Orthopaedic Surgery

## 2018-05-18 DIAGNOSIS — F419 Anxiety disorder, unspecified: Secondary | ICD-10-CM | POA: Diagnosis not present

## 2018-05-18 DIAGNOSIS — M9981 Other biomechanical lesions of cervical region: Secondary | ICD-10-CM | POA: Diagnosis not present

## 2018-05-18 DIAGNOSIS — M4722 Other spondylosis with radiculopathy, cervical region: Secondary | ICD-10-CM | POA: Insufficient documentation

## 2018-05-18 DIAGNOSIS — M9982 Other biomechanical lesions of thoracic region: Secondary | ICD-10-CM | POA: Diagnosis not present

## 2018-05-18 DIAGNOSIS — F329 Major depressive disorder, single episode, unspecified: Secondary | ICD-10-CM | POA: Insufficient documentation

## 2018-05-18 DIAGNOSIS — M4322 Fusion of spine, cervical region: Secondary | ICD-10-CM | POA: Diagnosis not present

## 2018-05-18 DIAGNOSIS — E785 Hyperlipidemia, unspecified: Secondary | ICD-10-CM | POA: Diagnosis not present

## 2018-05-18 DIAGNOSIS — Z79899 Other long term (current) drug therapy: Secondary | ICD-10-CM | POA: Diagnosis not present

## 2018-05-18 DIAGNOSIS — Z87891 Personal history of nicotine dependence: Secondary | ICD-10-CM | POA: Diagnosis not present

## 2018-05-18 DIAGNOSIS — K580 Irritable bowel syndrome with diarrhea: Secondary | ICD-10-CM | POA: Insufficient documentation

## 2018-05-18 DIAGNOSIS — Z7982 Long term (current) use of aspirin: Secondary | ICD-10-CM | POA: Insufficient documentation

## 2018-05-18 DIAGNOSIS — I10 Essential (primary) hypertension: Secondary | ICD-10-CM | POA: Diagnosis not present

## 2018-05-18 DIAGNOSIS — M5013 Cervical disc disorder with radiculopathy, cervicothoracic region: Secondary | ICD-10-CM | POA: Diagnosis not present

## 2018-05-18 DIAGNOSIS — Z791 Long term (current) use of non-steroidal anti-inflammatories (NSAID): Secondary | ICD-10-CM | POA: Diagnosis not present

## 2018-05-18 DIAGNOSIS — K219 Gastro-esophageal reflux disease without esophagitis: Secondary | ICD-10-CM | POA: Insufficient documentation

## 2018-05-18 DIAGNOSIS — M50223 Other cervical disc displacement at C6-C7 level: Secondary | ICD-10-CM | POA: Diagnosis not present

## 2018-05-18 DIAGNOSIS — M4802 Spinal stenosis, cervical region: Secondary | ICD-10-CM | POA: Diagnosis not present

## 2018-05-18 DIAGNOSIS — Z419 Encounter for procedure for purposes other than remedying health state, unspecified: Secondary | ICD-10-CM

## 2018-05-18 HISTORY — PX: ANTERIOR CERVICAL DECOMP/DISCECTOMY FUSION: SHX1161

## 2018-05-18 SURGERY — ANTERIOR CERVICAL DECOMPRESSION/DISCECTOMY FUSION 2 LEVELS
Anesthesia: General

## 2018-05-18 MED ORDER — ESCITALOPRAM OXALATE 10 MG PO TABS
10.0000 mg | ORAL_TABLET | Freq: Every day | ORAL | Status: DC
  Administered 2018-05-18: 10 mg via ORAL
  Filled 2018-05-18 (×2): qty 1

## 2018-05-18 MED ORDER — LIDOCAINE 2% (20 MG/ML) 5 ML SYRINGE
INTRAMUSCULAR | Status: DC | PRN
  Administered 2018-05-18: 100 mg via INTRAVENOUS

## 2018-05-18 MED ORDER — FENTANYL CITRATE (PF) 250 MCG/5ML IJ SOLN
INTRAMUSCULAR | Status: AC
  Filled 2018-05-18: qty 5

## 2018-05-18 MED ORDER — HYDROMORPHONE HCL 1 MG/ML IJ SOLN
0.2500 mg | INTRAMUSCULAR | Status: DC | PRN
  Administered 2018-05-18: 0.5 mg via INTRAVENOUS

## 2018-05-18 MED ORDER — VITAMIN B-12 1000 MCG PO TABS
1000.0000 ug | ORAL_TABLET | Freq: Every day | ORAL | Status: DC
  Administered 2018-05-18: 1000 ug via ORAL
  Filled 2018-05-18 (×2): qty 1

## 2018-05-18 MED ORDER — PROPOFOL 10 MG/ML IV BOLUS
INTRAVENOUS | Status: DC | PRN
  Administered 2018-05-18: 50 mg via INTRAVENOUS
  Administered 2018-05-18: 150 mg via INTRAVENOUS

## 2018-05-18 MED ORDER — CEFAZOLIN SODIUM-DEXTROSE 1-4 GM/50ML-% IV SOLN
1.0000 g | Freq: Three times a day (TID) | INTRAVENOUS | Status: AC
  Administered 2018-05-18 – 2018-05-19 (×2): 1 g via INTRAVENOUS
  Filled 2018-05-18 (×2): qty 50

## 2018-05-18 MED ORDER — FENTANYL CITRATE (PF) 100 MCG/2ML IJ SOLN
INTRAMUSCULAR | Status: DC | PRN
  Administered 2018-05-18 (×2): 50 ug via INTRAVENOUS
  Administered 2018-05-18: 100 ug via INTRAVENOUS
  Administered 2018-05-18 (×2): 50 ug via INTRAVENOUS
  Administered 2018-05-18: 25 ug via INTRAVENOUS

## 2018-05-18 MED ORDER — ROCURONIUM BROMIDE 50 MG/5ML IV SOSY
PREFILLED_SYRINGE | INTRAVENOUS | Status: DC | PRN
  Administered 2018-05-18 (×2): 20 mg via INTRAVENOUS
  Administered 2018-05-18: 50 mg via INTRAVENOUS
  Administered 2018-05-18: 30 mg via INTRAVENOUS

## 2018-05-18 MED ORDER — MIDAZOLAM HCL 2 MG/2ML IJ SOLN
INTRAMUSCULAR | Status: AC
  Filled 2018-05-18: qty 2

## 2018-05-18 MED ORDER — ONDANSETRON HCL 4 MG/2ML IJ SOLN
INTRAMUSCULAR | Status: DC | PRN
  Administered 2018-05-18: 4 mg via INTRAVENOUS

## 2018-05-18 MED ORDER — PANTOPRAZOLE SODIUM 40 MG PO TBEC
40.0000 mg | DELAYED_RELEASE_TABLET | Freq: Every day | ORAL | Status: DC
  Administered 2018-05-18: 40 mg via ORAL
  Filled 2018-05-18: qty 1

## 2018-05-18 MED ORDER — HYDROMORPHONE HCL 1 MG/ML IJ SOLN
INTRAMUSCULAR | Status: AC
  Administered 2018-05-18: 0.5 mg via INTRAVENOUS
  Filled 2018-05-18: qty 1

## 2018-05-18 MED ORDER — ROCURONIUM BROMIDE 50 MG/5ML IV SOSY
PREFILLED_SYRINGE | INTRAVENOUS | Status: AC
  Filled 2018-05-18: qty 10

## 2018-05-18 MED ORDER — ACETAMINOPHEN 650 MG RE SUPP: 650.0000 mg | RECTAL | Status: DC | PRN

## 2018-05-18 MED ORDER — OXYCODONE HCL 5 MG/5ML PO SOLN: 5.0000 mg | Freq: Once | ORAL | Status: DC | PRN

## 2018-05-18 MED ORDER — ROSUVASTATIN CALCIUM 20 MG PO TABS
40.0000 mg | ORAL_TABLET | Freq: Every day | ORAL | Status: DC
  Administered 2018-05-18: 40 mg via ORAL
  Filled 2018-05-18: qty 2

## 2018-05-18 MED ORDER — PROPOFOL 10 MG/ML IV BOLUS
INTRAVENOUS | Status: AC
  Filled 2018-05-18: qty 20

## 2018-05-18 MED ORDER — AMLODIPINE BESYLATE 5 MG PO TABS
5.0000 mg | ORAL_TABLET | Freq: Every day | ORAL | Status: DC
  Administered 2018-05-18: 5 mg via ORAL
  Filled 2018-05-18: qty 1

## 2018-05-18 MED ORDER — PHENOL 1.4 % MT LIQD: 1.0000 | OROMUCOSAL | Status: DC | PRN

## 2018-05-18 MED ORDER — METHOCARBAMOL 1000 MG/10ML IJ SOLN
500.0000 mg | Freq: Four times a day (QID) | INTRAVENOUS | Status: DC | PRN
  Filled 2018-05-18: qty 5

## 2018-05-18 MED ORDER — DEXAMETHASONE SODIUM PHOSPHATE 10 MG/ML IJ SOLN
INTRAMUSCULAR | Status: AC
  Filled 2018-05-18: qty 1

## 2018-05-18 MED ORDER — SODIUM CHLORIDE 0.9 % IV SOLN: INTRAVENOUS | Status: DC

## 2018-05-18 MED ORDER — DEXAMETHASONE SODIUM PHOSPHATE 10 MG/ML IJ SOLN
INTRAMUSCULAR | Status: DC | PRN
  Administered 2018-05-18: 5 mg via INTRAVENOUS

## 2018-05-18 MED ORDER — LIDOCAINE HCL (CARDIAC) PF 100 MG/5ML IV SOSY: PREFILLED_SYRINGE | INTRAVENOUS | Status: DC | PRN

## 2018-05-18 MED ORDER — MENTHOL 3 MG MT LOZG
1.0000 | LOZENGE | OROMUCOSAL | Status: DC | PRN
  Administered 2018-05-19: 3 mg via ORAL
  Filled 2018-05-18: qty 9

## 2018-05-18 MED ORDER — ACETAMINOPHEN 325 MG PO TABS: 650.0000 mg | ORAL_TABLET | ORAL | Status: DC | PRN

## 2018-05-18 MED ORDER — BUPIVACAINE HCL (PF) 0.25 % IJ SOLN
INTRAMUSCULAR | Status: AC
  Filled 2018-05-18: qty 30

## 2018-05-18 MED ORDER — CHLORHEXIDINE GLUCONATE 4 % EX LIQD: 60.0000 mL | Freq: Once | CUTANEOUS | Status: DC

## 2018-05-18 MED ORDER — 0.9 % SODIUM CHLORIDE (POUR BTL) OPTIME
TOPICAL | Status: DC | PRN
  Administered 2018-05-18: 1000 mL

## 2018-05-18 MED ORDER — SODIUM CHLORIDE 0.9% FLUSH
3.0000 mL | Freq: Two times a day (BID) | INTRAVENOUS | Status: DC
  Administered 2018-05-18: 3 mL via INTRAVENOUS

## 2018-05-18 MED ORDER — OXYCODONE HCL 5 MG PO TABS: 5.0000 mg | ORAL_TABLET | Freq: Once | ORAL | Status: DC | PRN

## 2018-05-18 MED ORDER — ONDANSETRON HCL 4 MG/2ML IJ SOLN: 4.0000 mg | Freq: Four times a day (QID) | INTRAMUSCULAR | Status: DC | PRN

## 2018-05-18 MED ORDER — SUGAMMADEX SODIUM 200 MG/2ML IV SOLN
INTRAVENOUS | Status: DC | PRN
  Administered 2018-05-18: 180 mg via INTRAVENOUS

## 2018-05-18 MED ORDER — ROCURONIUM BROMIDE 50 MG/5ML IV SOSY
PREFILLED_SYRINGE | INTRAVENOUS | Status: AC
  Filled 2018-05-18: qty 5

## 2018-05-18 MED ORDER — MIDAZOLAM HCL 2 MG/2ML IJ SOLN
INTRAMUSCULAR | Status: DC | PRN
  Administered 2018-05-18: 2 mg via INTRAVENOUS

## 2018-05-18 MED ORDER — METHOCARBAMOL 500 MG PO TABS
500.0000 mg | ORAL_TABLET | Freq: Four times a day (QID) | ORAL | Status: DC | PRN
  Administered 2018-05-18 – 2018-05-19 (×2): 500 mg via ORAL
  Filled 2018-05-18 (×2): qty 1

## 2018-05-18 MED ORDER — ELUXADOLINE 100 MG PO TABS: 100.0000 mg | ORAL_TABLET | Freq: Two times a day (BID) | ORAL | Status: DC

## 2018-05-18 MED ORDER — HYDROMORPHONE HCL 1 MG/ML IJ SOLN: 0.5000 mg | INTRAMUSCULAR | Status: DC | PRN

## 2018-05-18 MED ORDER — MEPERIDINE HCL 50 MG/ML IJ SOLN: 6.2500 mg | INTRAMUSCULAR | Status: DC | PRN

## 2018-05-18 MED ORDER — DOCUSATE SODIUM 100 MG PO CAPS
100.0000 mg | ORAL_CAPSULE | Freq: Two times a day (BID) | ORAL | Status: DC
  Filled 2018-05-18: qty 1

## 2018-05-18 MED ORDER — BUPIVACAINE-EPINEPHRINE 0.25% -1:200000 IJ SOLN
INTRAMUSCULAR | Status: DC | PRN
  Administered 2018-05-18: 6 mL

## 2018-05-18 MED ORDER — PROMETHAZINE HCL 25 MG/ML IJ SOLN: 6.2500 mg | INTRAMUSCULAR | Status: DC | PRN

## 2018-05-18 MED ORDER — ONDANSETRON HCL 4 MG PO TABS: 4.0000 mg | ORAL_TABLET | Freq: Four times a day (QID) | ORAL | Status: DC | PRN

## 2018-05-18 MED ORDER — OXYCODONE HCL 5 MG PO TABS
5.0000 mg | ORAL_TABLET | ORAL | Status: DC | PRN
  Administered 2018-05-18 – 2018-05-19 (×4): 5 mg via ORAL
  Filled 2018-05-18 (×4): qty 1

## 2018-05-18 MED ORDER — SODIUM CHLORIDE 0.9% FLUSH: 3.0000 mL | INTRAVENOUS | Status: DC | PRN

## 2018-05-18 MED ORDER — LIDOCAINE 2% (20 MG/ML) 5 ML SYRINGE
INTRAMUSCULAR | Status: AC
  Filled 2018-05-18: qty 5

## 2018-05-18 MED ORDER — ONDANSETRON HCL 4 MG/2ML IJ SOLN
INTRAMUSCULAR | Status: AC
  Filled 2018-05-18: qty 2

## 2018-05-18 MED ORDER — POLYETHYLENE GLYCOL 3350 17 G PO PACK
17.0000 g | PACK | Freq: Every day | ORAL | Status: DC
  Administered 2018-05-18: 17 g via ORAL
  Filled 2018-05-18: qty 1

## 2018-05-18 MED ORDER — CEFAZOLIN SODIUM-DEXTROSE 2-4 GM/100ML-% IV SOLN
2.0000 g | INTRAVENOUS | Status: AC
  Administered 2018-05-18: 2 g via INTRAVENOUS
  Filled 2018-05-18: qty 100

## 2018-05-18 MED ORDER — LACTATED RINGERS IV SOLN
INTRAVENOUS | Status: DC | PRN
  Administered 2018-05-18 (×2): via INTRAVENOUS

## 2018-05-18 SURGICAL SUPPLY — 56 items
BENZOIN TINCTURE PRP APPL 2/3 (GAUZE/BANDAGES/DRESSINGS) ×2 IMPLANT
BIT DRILL SRG 14X2.2XFLT CHK (BIT) IMPLANT
BIT DRL SRG 14X2.2XFLT CHK (BIT) ×1
BLADE CLIPPER SURG (BLADE) IMPLANT
BONE CERV LORDOTIC 14.5X12X7 (Bone Implant) ×4 IMPLANT
BUR ROUND FLUTED 4 SOFT TCH (BURR) IMPLANT
COLLAR CERV LO CONTOUR FIRM DE (SOFTGOODS) ×1 IMPLANT
CORD BIPOLAR FORCEPS 12FT (ELECTRODE) ×2 IMPLANT
COVER SURGICAL LIGHT HANDLE (MISCELLANEOUS) ×2 IMPLANT
COVER WAND RF STERILE (DRAPES) ×2 IMPLANT
CRADLE DONUT ADULT HEAD (MISCELLANEOUS) ×2 IMPLANT
DRAPE C-ARM 42X72 X-RAY (DRAPES) ×2 IMPLANT
DRAPE HALF SHEET 40X57 (DRAPES) ×2 IMPLANT
DRAPE MICROSCOPE LEICA (MISCELLANEOUS) ×2 IMPLANT
DRILL BIT SKYLINE 14MM (BIT) ×1
DURAPREP 6ML APPLICATOR 50/CS (WOUND CARE) ×2 IMPLANT
ELECT COATED BLADE 2.86 ST (ELECTRODE) ×2 IMPLANT
ELECT REM PT RETURN 9FT ADLT (ELECTROSURGICAL) ×2
ELECTRODE REM PT RTRN 9FT ADLT (ELECTROSURGICAL) ×1 IMPLANT
EVACUATOR 1/8 PVC DRAIN (DRAIN) ×2 IMPLANT
GAUZE SPONGE 4X4 12PLY STRL (GAUZE/BANDAGES/DRESSINGS) ×2 IMPLANT
GLOVE BIOGEL PI IND STRL 8 (GLOVE) ×2 IMPLANT
GLOVE BIOGEL PI INDICATOR 8 (GLOVE) ×2
GLOVE ORTHO TXT STRL SZ7.5 (GLOVE) ×4 IMPLANT
GOWN STRL REUS W/ TWL LRG LVL3 (GOWN DISPOSABLE) ×1 IMPLANT
GOWN STRL REUS W/ TWL XL LVL3 (GOWN DISPOSABLE) ×1 IMPLANT
GOWN STRL REUS W/TWL 2XL LVL3 (GOWN DISPOSABLE) ×2 IMPLANT
GOWN STRL REUS W/TWL LRG LVL3 (GOWN DISPOSABLE) ×1
GOWN STRL REUS W/TWL XL LVL3 (GOWN DISPOSABLE) ×1
GRAFT BNE SPCR VG2 14.5X12X7 (Bone Implant) IMPLANT
HEAD HALTER (SOFTGOODS) ×2 IMPLANT
HEMOSTAT SURGICEL 2X14 (HEMOSTASIS) IMPLANT
KIT BASIN OR (CUSTOM PROCEDURE TRAY) ×2 IMPLANT
KIT TURNOVER KIT B (KITS) ×2 IMPLANT
MANIFOLD NEPTUNE II (INSTRUMENTS) IMPLANT
NDL 25GX 5/8IN NON SAFETY (NEEDLE) ×1 IMPLANT
NEEDLE 25GX 5/8IN NON SAFETY (NEEDLE) ×2 IMPLANT
NS IRRIG 1000ML POUR BTL (IV SOLUTION) ×2 IMPLANT
PACK ORTHO CERVICAL (CUSTOM PROCEDURE TRAY) ×2 IMPLANT
PAD ARMBOARD 7.5X6 YLW CONV (MISCELLANEOUS) ×4 IMPLANT
PATTIES SURGICAL .5 X.5 (GAUZE/BANDAGES/DRESSINGS) IMPLANT
PIN TEMP SKYLINE THREADED (PIN) ×1 IMPLANT
PLATE SKYLINE TWO LEVEL 32MM (Plate) ×1 IMPLANT
RESTRAINT LIMB HOLDER UNIV (RESTRAINTS) IMPLANT
SCREW VAR SELF TAP SKYLINE 14M (Screw) ×6 IMPLANT
SPONGE INTESTINAL PEANUT (DISPOSABLE) ×1 IMPLANT
STRIP CLOSURE SKIN 1/2X4 (GAUZE/BANDAGES/DRESSINGS) ×2 IMPLANT
SURGIFLO W/THROMBIN 8M KIT (HEMOSTASIS) IMPLANT
SUT BONE WAX W31G (SUTURE) ×2 IMPLANT
SUT VIC AB 3-0 X1 27 (SUTURE) ×2 IMPLANT
SUT VICRYL 4-0 PS2 18IN ABS (SUTURE) ×4 IMPLANT
TAPE CLOTH SURG 4X10 WHT LF (GAUZE/BANDAGES/DRESSINGS) ×1 IMPLANT
TOWEL OR 17X24 6PK STRL BLUE (TOWEL DISPOSABLE) ×2 IMPLANT
TOWEL OR 17X26 10 PK STRL BLUE (TOWEL DISPOSABLE) ×2 IMPLANT
TRAY FOLEY CATH SILVER 16FR (SET/KITS/TRAYS/PACK) IMPLANT
YANKAUER SUCT BULB TIP NO VENT (SUCTIONS) ×1 IMPLANT

## 2018-05-18 NOTE — Progress Notes (Signed)
Nasal betadine completed day of surgery.  Patient stated that he did not start the mupirocin that was called in for him prior to surgery.

## 2018-05-18 NOTE — Plan of Care (Signed)
  Problem: Education: Goal: Ability to verbalize activity precautions or restrictions will improve Outcome: Progressing Goal: Knowledge of the prescribed therapeutic regimen will improve Outcome: Progressing Goal: Understanding of discharge needs will improve Outcome: Progressing   Problem: Activity: Goal: Ability to avoid complications of mobility impairment will improve Outcome: Progressing Goal: Ability to tolerate increased activity will improve Outcome: Progressing Goal: Will remain free from falls Outcome: Progressing   Problem: Pain Management: Goal: Pain level will decrease Outcome: Progressing   Problem: Safety: Goal: Ability to remain free from injury will improve Outcome: Progressing

## 2018-05-18 NOTE — Transfer of Care (Signed)
Immediate Anesthesia Transfer of Care Note  Patient: Seth Mcmillan  Procedure(s) Performed: C6-7. C7-T1 ANTERIOR CERVICAL DECOMPRESSION/DISCECTOMY FUSION, ALLOGRAFT, PLATE (N/A )  Patient Location: PACU  Anesthesia Type:General  Level of Consciousness: awake  Airway & Oxygen Therapy: Patient Spontanous Breathing and Patient connected to nasal cannula oxygen  Post-op Assessment: Report given to RN and Patient moving all extremities X 4  Post vital signs: Reviewed and stable  Last Vitals:  Vitals Value Taken Time  BP 151/117 05/18/2018  3:36 PM  Temp    Pulse 100 05/18/2018  3:37 PM  Resp 13 05/18/2018  3:37 PM  SpO2 99 % 05/18/2018  3:37 PM  Vitals shown include unvalidated device data.  Last Pain:  Vitals:   05/18/18 1052  TempSrc: Oral  PainSc: 2       Patients Stated Pain Goal: 4 (25/91/02 8902)  Complications: No apparent anesthesia complications

## 2018-05-18 NOTE — H&P (Signed)
Seth Mcmillan is an 53 y.o. male.   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  Continues to have ongoing neck pain with left upper extremity radiculopathy and left arm weakness that is unchanged since last office visit.  He is wanting to proceed with C6-7 and C7-T1 ACDF as scheduled.  Past Medical History:  Diagnosis Date  . Anxiety   . Arthritis   . Depression   . Fatty liver   . GERD (gastroesophageal reflux disease)   . Hyperlipidemia   . Hypertension   . IBS (irritable bowel syndrome)    Diarrhea type    Past Surgical History:  Procedure Laterality Date  . BIOPSY  09/05/2015   Procedure: BIOPSY;  Surgeon: Danie Binder, MD;  Location: [facility];  Service: Endoscopy;;  random colon bx  . COLONOSCOPY N/A 09/05/2015   Dr. Oneida Alar: 4 simple adenomas removed.  Next colonoscopy 3 years.  Marland Kitchen KNEE SURGERY Right 1997   bursa sac  . POLYPECTOMY  09/05/2015   Procedure: POLYPECTOMY;  Surgeon: Danie Binder, MD;  Location: [facility];  Service: Endoscopy;;  colon polyps  . SHOULDER ARTHROSCOPY WITH BICEPS TENDON REPAIR Right 2011  . TONSILECTOMY, ADENOIDECTOMY, BILATERAL MYRINGOTOMY AND TUBES      Family History  Problem Relation Age of Onset  . Heart disease Mother   . Heart disease Father   . Diabetes Brother   . Colon cancer Neg Hx    Social History:  reports that he quit smoking about 10 months ago. His smoking use included cigarettes. He has a 25.00 pack-year smoking history. He has never used smokeless tobacco. He reports current alcohol use of about 12.0 standard drinks of alcohol per week. He reports that he does not use drugs.  Allergies: No Known Allergies  Medications Prior to Admission  Medication Sig Dispense Refill  . amLODipine (NORVASC) 5 MG tablet Take 1 tablet (5 mg total) by mouth daily. 90 tablet 1  . Ascorbic Acid (VITAMIN C) 1000 MG tablet  Take 1,000 mg by mouth daily.    Marland Kitchen aspirin EC 81 MG tablet Take 81 mg by mouth daily.    . cholecalciferol (VITAMIN D3) 25 MCG (1000 UT) tablet Take 1,000 Units by mouth daily.    Marland Kitchen escitalopram (LEXAPRO) 10 MG tablet Take 1 tablet (10 mg total) by mouth daily. 90 tablet 3  . naproxen (NAPROSYN) 500 MG tablet Take 1 tablet (500 mg total) by mouth 2 (two) times daily with a meal. (Patient taking differently: Take 500 mg by mouth 2 (two) times daily as needed for moderate pain. ) 30 tablet 1  . omeprazole (PRILOSEC) 40 MG capsule Take 1 capsule (40 mg total) by mouth daily. 90 capsule 3  . rosuvastatin (CRESTOR) 40 MG tablet Take 1 tablet (40 mg total) by mouth daily. 90 tablet 3  . VIBERZI 100 MG TABS TAKE 1 TABLET BY MOUTH TWICE DAILY WITH FOOD (Patient taking differently: Take 100 mg by mouth 2 (two) times daily. ) 60 tablet 5  . vitamin B-12 (CYANOCOBALAMIN) 1000 MCG tablet Take 1,000 mcg by mouth daily.    . vitamin E 400 UNIT capsule Take 400 Units by mouth daily.      No results found for this or any previous visit (from the past 48 hour(s)). No results found.  Review of Systems  Constitutional: Negative.   HENT: Negative.  Respiratory: Negative.   Cardiovascular: Negative.   Gastrointestinal: Negative.   Genitourinary: Negative.   Musculoskeletal: Positive for neck pain.  Neurological: Positive for tingling and weakness.  Psychiatric/Behavioral: Negative.     Blood pressure (!) 143/95, pulse 83, temperature 97.7 F (36.5 C), temperature source Oral, resp. rate 20, height 6' (1.829 m), weight 88 kg, SpO2 100 %. Physical Exam   Assessment/Plan C6-7 and C7-T HNP/stenosis, neck pain and left UE radiculopathy   We will proceed with C6-T1 ACDF as scheduled  Surgical procedure along with possible risks and complications discussed. All questions answered and wishes to proceed.   Benjiman Core, PA-C 05/18/2018, 11:39 AM

## 2018-05-18 NOTE — Anesthesia Preprocedure Evaluation (Signed)
Anesthesia Evaluation  Patient identified by MRN, date of birth, ID band Patient awake    Reviewed: Allergy & Precautions, NPO status , Patient's Chart, lab work & pertinent test results  Airway Mallampati: II  TM Distance: >3 FB Neck ROM: Full    Dental  (+) Dental Advisory Given   Pulmonary neg pulmonary ROS, former smoker,    Pulmonary exam normal breath sounds clear to auscultation       Cardiovascular hypertension, negative cardio ROS Normal cardiovascular exam Rhythm:Regular Rate:Normal     Neuro/Psych PSYCHIATRIC DISORDERS Anxiety Depression negative neurological ROS     GI/Hepatic Neg liver ROS, GERD  ,  Endo/Other  negative endocrine ROS  Renal/GU negative Renal ROS     Musculoskeletal  (+) Arthritis ,   Abdominal   Peds  Hematology negative hematology ROS (+)   Anesthesia Other Findings   Reproductive/Obstetrics negative OB ROS                             Anesthesia Physical Anesthesia Plan  ASA: II  Anesthesia Plan: General   Post-op Pain Management:    Induction: Intravenous  PONV Risk Score and Plan: 3 and Ondansetron, Dexamethasone, Midazolam and Treatment may vary due to age or medical condition  Airway Management Planned: Oral ETT and Video Laryngoscope Planned  Additional Equipment: None  Intra-op Plan:   Post-operative Plan: Extubation in OR  Informed Consent: I have reviewed the patients History and Physical, chart, labs and discussed the procedure including the risks, benefits and alternatives for the proposed anesthesia with the patient or authorized representative who has indicated his/her understanding and acceptance.     Dental advisory given  Plan Discussed with: CRNA  Anesthesia Plan Comments:         Anesthesia Quick Evaluation

## 2018-05-18 NOTE — Interval H&P Note (Signed)
History and Physical Interval Note:  05/18/2018 11:28 AM  Seth Mcmillan  has presented today for surgery, with the diagnosis of c6-7, c7-t1 cervical spondylosis, radiculopathy  The various methods of treatment have been discussed with the patient and family. After consideration of risks, benefits and other options for treatment, the patient has consented to  Procedure(s): C6-7. C7-T1 ANTERIOR CERVICAL DECOMPRESSION/DISCECTOMY FUSION, ALLOGRAFT, PLATE (N/A) as a surgical intervention .  The patient's history has been reviewed, patient examined, no change in status, stable for surgery.  I have reviewed the patient's chart and labs.  Questions were answered to the patient's satisfaction.     Marybelle Killings

## 2018-05-18 NOTE — Interval H&P Note (Signed)
History and Physical Interval Note:  05/18/2018 12:04 PM  Seth Mcmillan  has presented today for surgery, with the diagnosis of c6-7, c7-t1 cervical spondylosis, radiculopathy  The various methods of treatment have been discussed with the patient and family. After consideration of risks, benefits and other options for treatment, the patient has consented to  Procedure(s): C6-7. C7-T1 ANTERIOR CERVICAL DECOMPRESSION/DISCECTOMY FUSION, ALLOGRAFT, PLATE (N/A) as a surgical intervention .  The patient's history has been reviewed, patient examined, no change in status, stable for surgery.  I have reviewed the patient's chart and labs.  Questions were answered to the patient's satisfaction.     Marybelle Killings

## 2018-05-18 NOTE — Op Note (Signed)
Preop diagnosis: Left C6-7 disc protrusion, C7-T1 foraminal stenosis, spondylosis.  Postop diagnosis: Same  Procedure: C6-7, C7-T1 anterior cervical discectomy and fusion allograft and plate.  Surgeon: Rodell Perna, MD  Assistant: Benjiman Core, PA-C medically necessary and present for the entire procedure  Anesthesia General +6 cc Marcaine local  Drains: 1 Hemovac neck  Implants Depuy  Synthes skyline 32 mm plate.  14 mm screws x6. VG 2 cortical cancellus allograft 7 mm graft height x2.  Procedure after standard prepping and draping with arms tucked at the side wrist restraints had ultra traction without weights neck was prepped with DuraPrep there is squared with towel sterile skin marker Betadine Steri-Drape sterile male standard the head and thyroid sheets and drapes were applied timeout procedure was completed Ancef was given prophylactically patient had positive MSSA preoperative nasal culture.  He was negative for MRSA.  Ancef was given and incision was made after timeout procedure.  Incision started at the midline 1 fingerbreadth above the clavicle and extended laterally on the left.  Blunt dissection down the level longus Coley muscle.  There was thick subtenons tissue overlying the longus Coley which was bluntly dissected.  A Kitner dissector was used.  Initially oblique images do do exposure initially at C7-T1 was required we moved 1 level of C6-7 where patient had prominent left disc protrusion and repeat oblique film followed by AP and lateral with lateral images with pulled out technique using the wrist restraints there is good visualization and confirmation that we are at C6-7 level.  Disc was marked with 15 blade removing a chunk of the disc self-retaining retractors were placed deep blades right and left smooth blade cephalad caudad.  Discectomy was performed operative microscope was draped and brought in we progressed back to the posterior longitudinal ligament removing it and  decompressing the disc protrusion there were no extravasated fragments uncovertebral joints carefully stripped with a Cloward curettes.  Some Surgi-Flo was placed down to the epidural space removed after few minutes epidural space was dry and trial sizing showed submillimeter graft gave good fit.  Countersunk 2 mm with CRNA pulling had ultra traction.  Graft gave good fit and identical procedure was repeated at the C7-T1 level.  Particular care was taken to go out far lateral on the left strip and the uncovertebral joints and opening up the foramina on the left.  6 graft fit fairly snugly but a 7 graft was placed to enlarge the foramina due to the significant foraminal stenosis worse on the left than right.  Graft was countersunk plate was selected held with single screw and spike adjusted and then all sick screws were placed confirmed exactly center on AP x-ray and all screws in the vertebral body in good position on lateral.  Screws were locked down.  Hemovacs placed in and out technique in line with the skin incision on the left.  3 oh platysma 4-0 Vicryl septic or closure tincture benzoin Steri-Strips 4 x 4's tape soft cervical collar and transferred recovery room in stable condition.

## 2018-05-18 NOTE — Anesthesia Procedure Notes (Signed)
Procedure Name: Intubation Date/Time: 05/18/2018 12:36 PM Performed by: Barrington Ellison, CRNA Pre-anesthesia Checklist: Patient identified, Emergency Drugs available, Suction available and Patient being monitored Patient Re-evaluated:Patient Re-evaluated prior to induction Oxygen Delivery Method: Circle System Utilized Preoxygenation: Pre-oxygenation with 100% oxygen Induction Type: IV induction Ventilation: Mask ventilation without difficulty Laryngoscope Size: Glidescope and 4 Grade View: Grade I Tube type: Oral Tube size: 7.5 mm Number of attempts: 1 Airway Equipment and Method: Stylet and Oral airway Placement Confirmation: ETT inserted through vocal cords under direct vision,  positive ETCO2 and breath sounds checked- equal and bilateral Secured at: 23 cm Tube secured with: Tape Dental Injury: Teeth and Oropharynx as per pre-operative assessment  Difficulty Due To: Difficulty was anticipated and Difficult Airway- due to reduced neck mobility Future Recommendations: Recommend- induction with short-acting agent, and alternative techniques readily available

## 2018-05-18 NOTE — H&P (Signed)
Patient: Seth Mcmillan                                    Date of Birth: 1966-02-06                                                      MRN: [id number] Visit Date: 04/09/2018                                                                     Requested by: [name], MD [place], [place] [address] PCP: [name], MD   Assessment & Plan: Visit Diagnoses:  1. Other spondylosis with radiculopathy, cervical region   2. Protrusion of cervical intervertebral disc   3. Foraminal stenosis of cervical region     Plan: Patient's had resistive and progressive arm weakness now for 3 months not responding to prednisone Dosepak exercise program anti-inflammatories.  Electrical test shows moderate acute left C7 radiculopathy on the left.  He also has C8 severe foraminal stenosis.  Plan would be C6-7, C7-T1 anterior cervical discectomy and fusion, allograft and plate.  Observation status overnight stay, soft collar for 6 weeks.  General anesthesia discussed risks of surgery discussed.  He understands he be out of work for 6 weeks unless he can do sitting work with his collar on if he could get a ride to work.  6 7 weeks after the procedure he should be able to resume all work activities.  Questions were elicited and answered he states he is concerned about his progressive arm weakness and increasing pain and would like to proceed.  Questions were elicited and answered.  Follow-Up Instructions: No follow-ups on file.   Orders:  No orders of the defined types were placed in this encounter.  No orders of the defined types were placed in this encounter.     Procedures: No procedures performed   Clinical Data: No additional findings.   Subjective:     Chief Complaint  Patient presents with  . Neck - Follow-up    EMG/NCS Review    HPI 53 year old male returns with persistent problems with neck pain radiating to the left arm with left arm  weakness triceps and finger extension.  Progressive left arm weakness for 3 months.  He denies any gait disturbance or lower extremity weakness no problems with stairs.  Increase pain with rotation of his neck.  Review of Systems 14 point systems positive for spondylosis disc protrusion with radiculopathy.  History of IBS, hypertension, GERD, hyperlipidemia.  Knee arthroscopy 2000.  Previous shoulder surgery 2009.  Past smoker current non-smoker.  Otherwise negative is a pertains to HPI.   Objective: Vital Signs: BP 134/87   Pulse 72   Ht 5\' 11"  (1.803 m)   Wt 185 lb (83.9 kg)   BMI 25.80 kg/m   Physical Exam Constitutional:      Appearance: He is well-developed.  HENT:     Head: Normocephalic and atraumatic.  Eyes:  Pupils: Pupils are equal, round, and reactive to light.  Neck:     Thyroid: No thyromegaly.     Trachea: No tracheal deviation.  Cardiovascular:     Rate and Rhythm: Normal rate.  Pulmonary:     Effort: Pulmonary effort is normal.     Breath sounds: No wheezing.  Abdominal:     General: Bowel sounds are normal.     Palpations: Abdomen is soft.  Skin:    General: Skin is warm and dry.     Capillary Refill: Capillary refill takes less than 2 seconds.  Neurological:     Mental Status: He is alert and oriented to person, place, and time.  Psychiatric:        Behavior: Behavior normal.        Thought Content: Thought content normal.        Judgment: Judgment normal.     Ortho Exam patient has significant left triceps weakness with atrophy absent triceps reflex on the left 2+ on the right.  Weakness finger extension wrist flexion on the left.  No interossei weakness or atrophy good grip strength.  Normal biceps.  Internal and external rotation of the shoulder right and left is normal.  Wrist pulses are normal.  Severe brachial plexus tenderness on the left positive Spurling on the left.  Normal lower extremity reflexes no lower extremity clonus no  hyperreflexia in the lower extremities.  Specialty Comments:  No specialty comments available.  Imaging:   Signed         Show:Clear all [] ?Manual[] ?Template[x] ?Copied  Added by: [x] ?Magnus Sinning, MD  [] ?Hover for details EMG & NCV Findings: Evaluation of the left median motor nerve showed reduced amplitude (4.6 mV) and decreased conduction velocity (Elbow-Wrist, 48 m/s). All remaining nerves (as indicated in the following tables) were within normal limits.   Needle evaluation of the left extensor digitorum communis and the left triceps muscles showed increased spontaneous activity, increased motor unit amplitude, and diminished recruitment. All remaining muscles (as indicated in the following table) showed no evidence of electrical instability.   Impression: The above electrodiagnostic study is ABNORMAL and reveals evidence of moderate acute C7 radiculopathy on the left.  There is no significant electrodiagnostic evidence of any other focal nerve entrapment or brachial plexopathy.   Recommendations: 1.Follow-up with referring physician. 2.Continue current management of symptoms. 3.Suggest surgical evaluation.  ___________________________ Laurence Spates FAAPMR Board Certified, American Board of Physical Medicine and Rehabilitation          Cervical MRI scan showed left worse than right facet degenerative changes uncovertebral spurring on the left at C7-T1 causing severe left foraminal narrowing and encroachment on the exiting left C8 root.  Uncovertebral disease on the left at C6-7 causing moderately severe foraminal narrowing.  Moderate foraminal narrowing at C5-6.  Mild to moderate narrowing at C4-5.  Mild to moderate narrowing at C3-4.   PMFS History:     Patient Active Problem List   Diagnosis Date Noted  . Protrusion of cervical intervertebral disc 03/12/2018  . Foraminal stenosis of cervical region 03/12/2018  . Other spondylosis  with radiculopathy, cervical region 03/05/2018  . Nausea without vomiting 11/21/2017  . GERD (gastroesophageal reflux disease) 08/22/2016  . IBS (irritable bowel syndrome) 11/23/2015  . History of colonic polyps 11/23/2015  . AP (abdominal pain)   . Diarrhea 08/22/2015  . Abdominal pain 08/22/2015  . HTN (hypertension) 08/18/2012  . Hyperlipidemia 08/18/2012       Past Medical History:  Diagnosis Date  .  Hyperlipidemia   . Hypertension   . IBS (irritable bowel syndrome)    Diarrhea type         Family History  Problem Relation Age of Onset  . Heart disease Mother   . Heart disease Father   . Diabetes Brother   . Colon cancer Neg Hx          Past Surgical History:  Procedure Laterality Date  . BIOPSY  09/05/2015   Procedure: BIOPSY;  Surgeon: Danie Binder, MD;  Location: [facility];  Service: Endoscopy;;  random colon bx  . COLONOSCOPY N/A 09/05/2015   Dr. Oneida Alar: 4 simple adenomas removed.  Next colonoscopy 3 years.  Marland Kitchen KNEE SURGERY Right 1997   bursa sac  . POLYPECTOMY  09/05/2015   Procedure: POLYPECTOMY;  Surgeon: Danie Binder, MD;  Location: [facility];  Service: Endoscopy;;  colon polyps  . SHOULDER ARTHROSCOPY WITH BICEPS TENDON REPAIR Right 2011  . TONSILECTOMY, ADENOIDECTOMY, BILATERAL MYRINGOTOMY AND TUBES     Social History        Occupational History  . Not on file  Tobacco Use  . Smoking status: Former Smoker    Packs/day: 1.00    Years: 25.00    Pack years: 25.00    Types: Cigarettes  . Smokeless tobacco: Never Used  Substance and Sexual Activity  . Alcohol use: Yes    Alcohol/week: 12.0 standard drinks    Types: 12 Cans of beer per week    Comment: 12 cans of beer/week, mostly on weekends.  . Drug use: No  . Sexual activity: Not on file

## 2018-05-19 ENCOUNTER — Encounter (HOSPITAL_COMMUNITY): Payer: Self-pay | Admitting: Orthopaedic Surgery

## 2018-05-19 DIAGNOSIS — M5013 Cervical disc disorder with radiculopathy, cervicothoracic region: Secondary | ICD-10-CM | POA: Diagnosis not present

## 2018-05-19 MED ORDER — OXYCODONE-ACETAMINOPHEN 5-325 MG PO TABS: 1.0000 | ORAL_TABLET | ORAL | 0 refills | Status: DC | PRN

## 2018-05-19 MED ORDER — METHOCARBAMOL 500 MG PO TABS: 500.0000 mg | ORAL_TABLET | Freq: Three times a day (TID) | ORAL | 0 refills | Status: DC | PRN

## 2018-05-19 NOTE — Anesthesia Postprocedure Evaluation (Signed)
Anesthesia Post Note  Patient: Seth Mcmillan  Procedure(s) Performed: C6-7. C7-T1 ANTERIOR CERVICAL DECOMPRESSION/DISCECTOMY FUSION, ALLOGRAFT, PLATE (N/A )     Patient location during evaluation: PACU Anesthesia Type: General Level of consciousness: sedated and patient cooperative Pain management: pain level controlled Vital Signs Assessment: post-procedure vital signs reviewed and stable Respiratory status: spontaneous breathing Cardiovascular status: stable Anesthetic complications: no    Last Vitals:  Vitals:   05/19/18 0329 05/19/18 0738  BP: (!) 149/94 (!) 142/97  Pulse: 87 75  Resp: 20 16  Temp: 36.9 C 36.5 C  SpO2: 97% 97%    Last Pain:  Vitals:   05/19/18 1000  TempSrc:   PainSc: 4    Pain Goal: Patients Stated Pain Goal: 2 (05/19/18 0925)                 Nolon Nations

## 2018-05-19 NOTE — Progress Notes (Signed)
Pt doing well. Pt given D/C instructions with verbal understanding. Rx's were sent to pharmacy by MD. Pt's incision is clean and dry with no sign of infection. Drain was removed by MD and dressing was changed prior to D/C. Pt's IV was removed prior to D/C. Pt D/C'd home via wheelchair @ 1010 per MD order. Pt is stable @ D/C and has no other needs at this time. Holli Humbles, RN

## 2018-05-19 NOTE — Progress Notes (Signed)
   Subjective: 1 Day Post-Op Procedure(s) (LRB): C6-7. C7-T1 ANTERIOR CERVICAL DECOMPRESSION/DISCECTOMY FUSION, ALLOGRAFT, PLATE (N/A) Patient reports pain as mild.    Objective: Vital signs in last 24 hours: Temp:  [97.7 F (36.5 C)-99.5 F (37.5 C)] 97.7 F (36.5 C) (03/03 0738) Pulse Rate:  [75-108] 75 (03/03 0738) Resp:  [10-24] 16 (03/03 0738) BP: (140-162)/(82-117) 142/97 (03/03 0738) SpO2:  [96 %-100 %] 97 % (03/03 0738) Weight:  [88 kg] 88 kg (03/02 1045)  Intake/Output from previous day: 03/02 0701 - 03/03 0700 In: 1003 [I.V.:1003] Out: 120 [Drains:20; Blood:100] Intake/Output this shift: No intake/output data recorded.  No results for input(s): HGB in the last 72 hours. No results for input(s): WBC, RBC, HCT, PLT in the last 72 hours. No results for input(s): NA, K, CL, CO2, BUN, CREATININE, GLUCOSE, CALCIUM in the last 72 hours. No results for input(s): LABPT, INR in the last 72 hours.  Neurologically intact Dg Cervical Spine 2-3 Views  Result Date: 05/18/2018 CLINICAL DATA:  Cervical fusion EXAM: CERVICAL SPINE - 2-3 VIEW; DG C-ARM 61-120 MIN COMPARISON:  03/10/2018 FLUOROSCOPY TIME:  Fluoroscopy Time:  23 seconds Radiation Exposure Index (if provided by the fluoroscopic device): 5.99 mGy Number of Acquired Spot Images: 2 FINDINGS: Interbody fusion is noted at what appears to be C6-7 and C7-T1 with anterior fixation. No acute abnormality is noted. IMPRESSION: Cervical fusion. Electronically Signed   By: Inez Catalina M.D.   On: 05/18/2018 16:21   Dg C-arm 1-60 Min  Result Date: 05/18/2018 CLINICAL DATA:  Cervical fusion EXAM: CERVICAL SPINE - 2-3 VIEW; DG C-ARM 61-120 MIN COMPARISON:  03/10/2018 FLUOROSCOPY TIME:  Fluoroscopy Time:  23 seconds Radiation Exposure Index (if provided by the fluoroscopic device): 5.99 mGy Number of Acquired Spot Images: 2 FINDINGS: Interbody fusion is noted at what appears to be C6-7 and C7-T1 with anterior fixation. No acute abnormality is  noted. IMPRESSION: Cervical fusion. Electronically Signed   By: Inez Catalina M.D.   On: 05/18/2018 16:21    Assessment/Plan: 1 Day Post-Op Procedure(s) (LRB): C6-7. C7-T1 ANTERIOR CERVICAL DECOMPRESSION/DISCECTOMY FUSION, ALLOGRAFT, PLATE (N/A) Plan:  Discharge home . Office one week.   Marybelle Killings 05/19/2018, 7:47 AM

## 2018-05-19 NOTE — Evaluation (Signed)
Occupational Therapy Evaluation and Discharge Patient Details Name: Seth Mcmillan MRN: [id number] DOB: 05/30/1965 Today's Date: 05/19/2018    History of Present Illness Pt admitted and underwent C6-7, C7-T1 anterior cervical discectomy and fusion    Clinical Impression   This 53 yo male admitted and underwent above presents to acute OT with all education completed, we will D/C from acute OT.    Follow Up Recommendations  No OT follow up    Equipment Recommendations  None recommended by OT       Precautions / Restrictions Precautions Precautions: Cervical Precaution Booklet Issued: Yes (comment) Required Braces or Orthoses: Cervical Brace Cervical Brace: Soft collar(extra soft collar to cover for shower) Restrictions Weight Bearing Restrictions: No      Mobility Bed Mobility Overal bed mobility: Modified Independent             General bed mobility comments: increased time due to pain  Transfers Overall transfer level: Independent                    Balance Overall balance assessment: Independent                                         ADL either performed or assessed with clinical judgement   ADL                                         General ADL Comments: Educated pt on use of two cups for brushing teeth to avoid bending over the sink; covering additional soft collar with Saran Press-n-seal for showering;  bringing legs up to him for socks and or shoes (not bending over to do them); wear button up or v-neck shirts. Pt able to doff and don soft collar independently. Gave pt 3 extra covers for soft collar     Vision Patient Visual Report: No change from baseline              Pertinent Vitals/Pain Pain Assessment: 0-10 Pain Score: 6  Pain Location: incisional Pain Descriptors / Indicators: Aching;Sore Pain Intervention(s): Limited activity within patient's tolerance;Monitored during session(asked if pt wanted me  to ask about pain meds and he said not right now)     Hand Dominance Right   Extremity/Trunk Assessment Upper Extremity Assessment Upper Extremity Assessment: Overall WFL for tasks assessed              Cognition Arousal/Alertness: Awake/alert Behavior During Therapy: WFL for tasks assessed/performed Overall Cognitive Status: Within Functional Limits for tasks assessed                                                Home Living Family/patient expects to be discharged to:: Private residence Living Arrangements: Spouse/significant other Available Help at Discharge: Family                         Home Equipment: None          Prior Functioning/Environment Level of Independence: Independent                 OT Problem List: Decreased range of motion;Pain  OT Goals(Current goals can be found in the care plan section) Acute Rehab OT Goals Patient Stated Goal: home today  OT Frequency:                AM-PAC OT "6 Clicks" Daily Activity     Outcome Measure Help from another person eating meals?: None Help from another person taking care of personal grooming?: None Help from another person toileting, which includes using toliet, bedpan, or urinal?: None Help from another person bathing (including washing, rinsing, drying)?: None Help from another person to put on and taking off regular upper body clothing?: None Help from another person to put on and taking off regular lower body clothing?: None 6 Click Score: 24   End of Session Equipment Utilized During Treatment: Cervical collar Nurse Communication: (no follow up OT needs)  Activity Tolerance: Patient tolerated treatment well Patient left: in bed;with call bell/phone within reach  OT Visit Diagnosis: Pain Pain - part of body: (neck)                Time: 5465-0354 OT Time Calculation (min): 27 min Charges:  OT General Charges $OT Visit: 1 Visit OT Evaluation $OT  Eval Moderate Complexity: 1 Mod OT Treatments $Self Care/Home Management : 8-22 mins Golden Circle, OTR/L Acute [employer] Pager [phone] Office [phone]     Almon Register 05/19/2018, 10:08 AM

## 2018-05-19 NOTE — Progress Notes (Signed)
Orthopedic Tech Progress Note Patient Details:  Seth Mcmillan 07/31/1965 [id number]  Ortho Devices Type of Ortho Device: Soft collar Ortho Device/Splint Interventions: Application   Post Interventions Patient Tolerated: Well Instructions Provided: Care of device   [place] Pink 05/19/2018, 8:32 AM

## 2018-05-19 NOTE — Discharge Instructions (Signed)
Keep your collar on at all times.  You can remove it when you exchange it for the extra collar covered with Saran wrap when he takes a shower.  After dry off remove the Saran wrap collar and reapply the dry collar.  Still has less swelling in your neck if he sleep in a beachchair or recliner type position for a while.  Walk daily.  On occasion you may have some pain in your arm as you did before surgery for short periods of time which should not be a concern.  You do not need to do anything to your incision.  See Dr. Lorin Mercy in 1 week.    Anterior Cervical Diskectomy and Fusion, Care After Refer to this sheet in the next few weeks. These instructions provide you with information about caring for yourself after your procedure. Your health care provider may also give you more specific instructions. Your treatment has been planned according to current medical practices, but problems sometimes occur. Call your health care provider if you have any problems or questions after your procedure. What can I expect after the procedure? After the procedure, it is common to have:  Neck pain.  Discomfort when swallowing.  Slight hoarseness. Follow these instructions at home: If You Have a Neck Brace:  Wear it as told by your health care provider. Remove it only as told by your health care provider.  Keep the brace clean and dry. Incision care   Follow instructions from your health care provider about how to take care of the cut made during surgery (incision). Make sure you: ? Wash your hands with soap and water before you change your bandage (dressing). If soap and water are not available, use hand sanitizer. ? Change your dressing as told by your health care provider. ? Leave stitches (sutures), skin glue, or adhesive strips in place. These skin closures may need to stay in place for 2 weeks or longer. If adhesive strip edges start to loosen and curl up, you may trim the loose edges. Do not remove adhesive  strips completely unless your health care provider tells you to do that.  Check your incision every day for signs of infection. Watch for: ? Redness, swelling, or pain. ? Fluid or blood. ? Warmth. ? Pus or a bad smell. Managing pain, stiffness, and swelling  Take over-the-counter and prescription medicines only as told by your health care provider.  If directed, apply ice to the injured area. ? Put ice in a plastic bag. ? Place a towel between your skin and the bag. ? Leave the ice on for 20 minutes, 2-3 times per day. Activity  Return to your normal activities as told by your health care provider. Ask your health care provider what activities are safe for you.  Do exercises as told by your health care provider.  Do not lift anything that is heavier than 10 lb (4.5 kg). General instructions  Do not drive or operate heavy machinery while taking prescription pain medicines.  Do not use any tobacco products, including cigarettes, chewing tobacco, or e-cigarettes. Tobacco can delay healing. If you need help quitting, ask your health care provider.  Keep all follow-up visits and physical therapy appointments as told by your health care provider. This is important. Contact a health care provider if:  You have a fever.  You have redness, swelling, or pain around your incision.  You have fluid or blood coming from your incision.  You have pus or a bad smell  coming from your incision.  You have pain that is not controlled by your pain medicine.  You have increasing hoarseness or trouble swallowing. Get help right away if:  You have severe pain.  You have sudden numbness or weakness in your arms.  You have warmth, tenderness, or swelling in your calf.  You have chest pain.  You have difficulty breathing. This information is not intended to replace advice given to you by your health care provider. Make sure you discuss any questions you have with your health care  provider. Document Released: 03/31/2015 Document Revised: 08/10/2015 Document Reviewed: 02/16/2015 Elsevier Interactive Patient Education  2019 [employer].

## 2018-05-22 ENCOUNTER — Telehealth (INDEPENDENT_AMBULATORY_CARE_PROVIDER_SITE_OTHER): Payer: Self-pay | Admitting: Orthopaedic Surgery

## 2018-05-22 NOTE — Telephone Encounter (Signed)
I called patient. Form in question was completed and faxed from [facility] office. Will send again per patient request.

## 2018-05-22 NOTE — Telephone Encounter (Signed)
Pt called in said he was checking the status of his paperwork that he turned in? Said he cant remember exactly when he turned it in but it was a form for his employer to still receive income.  [phone]

## 2018-05-25 ENCOUNTER — Telehealth (INDEPENDENT_AMBULATORY_CARE_PROVIDER_SITE_OTHER): Payer: Self-pay | Admitting: Orthopaedic Surgery

## 2018-05-25 NOTE — Telephone Encounter (Signed)
Seth Mcmillan with [employer] called concerning S/T Disability forms for the patient. She advised part C need to be completed and faxed back. The fax# is [phone]    The number to contact [place] is [phone]

## 2018-05-25 NOTE — Telephone Encounter (Signed)
Patient left a message to f/u with you regards to the forms for [facility],  He wants to know if the forms were faxed by you.  CB#[phone].  Thank you.

## 2018-05-25 NOTE — Telephone Encounter (Signed)
Holding for you. Seth Mcmillan said that she had discussed this with you on Friday.

## 2018-05-26 ENCOUNTER — Inpatient Hospital Stay (INDEPENDENT_AMBULATORY_CARE_PROVIDER_SITE_OTHER): Payer: 59 | Admitting: Orthopaedic Surgery

## 2018-05-26 NOTE — Telephone Encounter (Signed)
IC, no answer. LM advised this information was faxed on Friday per [facility].

## 2018-05-26 NOTE — Telephone Encounter (Signed)
Per Gwinda Passe, this information was faxed on Friday. It was resubmitted today.

## 2018-05-26 NOTE — Discharge Summary (Signed)
Patient ID: Seth Mcmillan MRN: [id number] DOB/AGE: October 09, 1965 53 y.o.  Admit date: 05/18/2018 Discharge date: 05/26/2018  Admission Diagnoses:  Active Problems:   Cervical spinal stenosis   Discharge Diagnoses:  Active Problems:   Cervical spinal stenosis  status post Procedure(s): C6-7. C7-T1 ANTERIOR CERVICAL DECOMPRESSION/DISCECTOMY FUSION, ALLOGRAFT, PLATE  Past Medical History:  Diagnosis Date  . Anxiety   . Arthritis   . Depression   . Fatty liver   . GERD (gastroesophageal reflux disease)   . Hyperlipidemia   . Hypertension   . IBS (irritable bowel syndrome)    Diarrhea type    Surgeries: Procedure(s): C6-7. C7-T1 ANTERIOR CERVICAL DECOMPRESSION/DISCECTOMY FUSION, ALLOGRAFT, PLATE on 05/19/74   Consultants:   Discharged Condition: Improved  Hospital Course: Seth Mcmillan is an 53 y.o. male who was admitted 05/18/2018 for operative treatment of cervical stenosis. Patient failed conservative treatments (please see the history and physical for the specifics) and had severe unremitting pain that affects sleep, daily activities and work/hobbies. After pre-op clearance, the patient was taken to the operating room on 05/18/2018 and underwent  Procedure(s): C6-7. C7-T1 ANTERIOR CERVICAL DECOMPRESSION/DISCECTOMY FUSION, ALLOGRAFT, PLATE.    Patient was given perioperative antibiotics:  Anti-infectives (From admission, onward)   Start     Dose/Rate Route Frequency Ordered Stop   05/18/18 2030  ceFAZolin (ANCEF) IVPB 1 g/50 mL premix     1 g 100 mL/hr over 30 Minutes Intravenous Every 8 hours 05/18/18 1648 05/19/18 0548   05/18/18 1030  ceFAZolin (ANCEF) IVPB 2g/100 mL premix     2 g 200 mL/hr over 30 Minutes Intravenous On call to O.R. 05/18/18 1029 05/18/18 1230       Patient was given sequential compression devices and early ambulation to prevent DVT.   Patient benefited maximally from hospital stay and there were no complications. At the time of discharge, the patient  was urinating/moving their bowels without difficulty, tolerating a regular diet, pain is controlled with oral pain medications and they have been cleared by PT/OT.   Recent vital signs: No data found.   Recent laboratory studies: No results for input(s): WBC, HGB, HCT, PLT, NA, K, CL, CO2, BUN, CREATININE, GLUCOSE, INR, CALCIUM in the last 72 hours.  Invalid input(s): PT, 2   Discharge Medications:   Allergies as of 05/19/2018   No Known Allergies     Medication List    STOP taking these medications   naproxen 500 MG tablet Commonly known as:  Naprosyn     TAKE these medications   amLODipine 5 MG tablet Commonly known as:  NORVASC Take 1 tablet (5 mg total) by mouth daily.   aspirin EC 81 MG tablet Take 81 mg by mouth daily.   cholecalciferol 25 MCG (1000 UT) tablet Commonly known as:  VITAMIN D3 Take 1,000 Units by mouth daily.   escitalopram 10 MG tablet Commonly known as:  LEXAPRO Take 1 tablet (10 mg total) by mouth daily.   methocarbamol 500 MG tablet Commonly known as:  Robaxin Take 1 tablet (500 mg total) by mouth every 8 (eight) hours as needed for muscle spasms.   omeprazole 40 MG capsule Commonly known as:  PRILOSEC Take 1 capsule (40 mg total) by mouth daily.   oxyCODONE-acetaminophen 5-325 MG tablet Commonly known as:  Percocet Take 1 tablet by mouth every 4 (four) hours as needed for severe pain.   rosuvastatin 40 MG tablet Commonly known as:  Crestor Take 1 tablet (40 mg total) by mouth daily.  Viberzi 100 MG Tabs Generic drug:  Eluxadoline TAKE 1 TABLET BY MOUTH TWICE DAILY WITH FOOD What changed:    how much to take  when to take this   vitamin B-12 1000 MCG tablet Commonly known as:  CYANOCOBALAMIN Take 1,000 mcg by mouth daily.   vitamin C 1000 MG tablet Take 1,000 mg by mouth daily.   vitamin E 400 UNIT capsule Take 400 Units by mouth daily.       Diagnostic Studies: Dg Chest 2 View  Result Date: 05/08/2018 CLINICAL DATA:   Preop chest x-ray EXAM: CHEST - 2 VIEW COMPARISON:  None. FINDINGS: The heart size and mediastinal contours are within normal limits. There is no focal infiltrate, pulmonary edema, or pleural effusion. The visualized skeletal structures are unremarkable. IMPRESSION: No active cardiopulmonary disease. Electronically Signed   By: Abelardo Diesel M.D.   On: 05/08/2018 17:01   Dg Cervical Spine 2-3 Views  Result Date: 05/18/2018 CLINICAL DATA:  Cervical fusion EXAM: CERVICAL SPINE - 2-3 VIEW; DG C-ARM 61-120 MIN COMPARISON:  03/10/2018 FLUOROSCOPY TIME:  Fluoroscopy Time:  23 seconds Radiation Exposure Index (if provided by the fluoroscopic device): 5.99 mGy Number of Acquired Spot Images: 2 FINDINGS: Interbody fusion is noted at what appears to be C6-7 and C7-T1 with anterior fixation. No acute abnormality is noted. IMPRESSION: Cervical fusion. Electronically Signed   By: Inez Catalina M.D.   On: 05/18/2018 16:21   Dg C-arm 1-60 Min  Result Date: 05/18/2018 CLINICAL DATA:  Cervical fusion EXAM: CERVICAL SPINE - 2-3 VIEW; DG C-ARM 61-120 MIN COMPARISON:  03/10/2018 FLUOROSCOPY TIME:  Fluoroscopy Time:  23 seconds Radiation Exposure Index (if provided by the fluoroscopic device): 5.99 mGy Number of Acquired Spot Images: 2 FINDINGS: Interbody fusion is noted at what appears to be C6-7 and C7-T1 with anterior fixation. No acute abnormality is noted. IMPRESSION: Cervical fusion. Electronically Signed   By: Inez Catalina M.D.   On: 05/18/2018 16:21      Follow-up Information    Marybelle Killings, MD Follow up in 1 week(s).   Specialty:  Orthopedic Surgery Contact information: [place] [address] [phone]           Discharge Plan:  discharge to home   Disposition:     Signed: Benjiman Core for Rodell Perna MD 05/26/2018, 9:18 AM

## 2018-05-27 ENCOUNTER — Telehealth (INDEPENDENT_AMBULATORY_CARE_PROVIDER_SITE_OTHER): Payer: Self-pay | Admitting: Orthopaedic Surgery

## 2018-05-27 NOTE — Telephone Encounter (Signed)
Juliann Pulse w/ [facility] called again stating still has not received part C form. I faxed to her again [phone]

## 2018-05-27 NOTE — Telephone Encounter (Signed)
Seth Mcmillan w/ [facility] [employer] called, I verfied date of surgery

## 2018-05-28 ENCOUNTER — Encounter (INDEPENDENT_AMBULATORY_CARE_PROVIDER_SITE_OTHER): Payer: Self-pay | Admitting: Orthopaedic Surgery

## 2018-05-28 ENCOUNTER — Ambulatory Visit (INDEPENDENT_AMBULATORY_CARE_PROVIDER_SITE_OTHER): Payer: 59 | Admitting: Orthopaedic Surgery

## 2018-05-28 ENCOUNTER — Ambulatory Visit (INDEPENDENT_AMBULATORY_CARE_PROVIDER_SITE_OTHER): Payer: 59

## 2018-05-28 VITALS — BP 148/94 | HR 85 | Ht 72.0 in | Wt 193.0 lb

## 2018-05-28 DIAGNOSIS — Z981 Arthrodesis status: Secondary | ICD-10-CM | POA: Diagnosis not present

## 2018-05-28 MED ORDER — HYDROCODONE-ACETAMINOPHEN 5-325 MG PO TABS: 1.0000 | ORAL_TABLET | Freq: Four times a day (QID) | ORAL | 0 refills | Status: DC | PRN

## 2018-05-28 NOTE — Progress Notes (Signed)
Post-Op Visit Note   Patient: Seth Mcmillan           Date of Birth: Dec 15, 1965           MRN: [id number] Visit Date: 05/28/2018 PCP: [name], DO   Assessment & Plan:  Chief Complaint:  Chief Complaint  Patient presents with  . Neck - Routine Post Op    05/18/2018 C6-7, C7-T1 ACDF   Visit Diagnoses:  1. History of fusion of cervical spine     Plan: Return 5 weeks lateral flexion-extension x-rays on return.  Continue collar immobilization.  We will discuss work resumption on return visit.  Follow-Up Instructions: No follow-ups on file.   Orders:  Orders Placed This Encounter  Procedures  . XR Cervical Spine 2 or 3 views   No orders of the defined types were placed in this encounter.   Imaging: No results found.  PMFS History: Patient Active Problem List   Diagnosis Date Noted  . Cervical spinal stenosis 05/18/2018  . Adjustment disorder with anxious mood 04/14/2018  . Protrusion of cervical intervertebral disc 03/12/2018  . Foraminal stenosis of cervical region 03/12/2018  . Other spondylosis with radiculopathy, cervical region 03/05/2018  . Nausea without vomiting 11/21/2017  . GERD (gastroesophageal reflux disease) 08/22/2016  . IBS (irritable bowel syndrome) 11/23/2015  . History of colonic polyps 11/23/2015  . AP (abdominal pain)   . Diarrhea 08/22/2015  . Abdominal pain 08/22/2015  . HTN (hypertension) 08/18/2012  . Hyperlipidemia 08/18/2012   Past Medical History:  Diagnosis Date  . Anxiety   . Arthritis   . Depression   . Fatty liver   . GERD (gastroesophageal reflux disease)   . Hyperlipidemia   . Hypertension   . IBS (irritable bowel syndrome)    Diarrhea type    Family History  Problem Relation Age of Onset  . Heart disease Mother   . Heart disease Father   . Diabetes Brother   . Colon cancer Neg Hx     Past Surgical History:  Procedure Laterality Date  . ANTERIOR CERVICAL DECOMP/DISCECTOMY FUSION N/A 05/18/2018   Procedure:  C6-7. C7-T1 ANTERIOR CERVICAL DECOMPRESSION/DISCECTOMY FUSION, ALLOGRAFT, PLATE;  Surgeon: Marybelle Killings, MD;  Location: [facility];  Service: Orthopedics;  Laterality: N/A;  . BIOPSY  09/05/2015   Procedure: BIOPSY;  Surgeon: Danie Binder, MD;  [facility];  Service: Endoscopy;;  random colon bx  . COLONOSCOPY N/A 09/05/2015   Dr. Oneida Alar: 4 simple adenomas removed.  Next colonoscopy 3 years.  Marland Kitchen KNEE SURGERY Right 1997   bursa sac  . POLYPECTOMY  09/05/2015   Procedure: POLYPECTOMY;  Surgeon: Danie Binder, MD;  [facility];  Service: Endoscopy;;  colon polyps  . SHOULDER ARTHROSCOPY WITH BICEPS TENDON REPAIR Right 2011  . TONSILECTOMY, ADENOIDECTOMY, BILATERAL MYRINGOTOMY AND TUBES     Social History   Occupational History  . Not on file  Tobacco Use  . Smoking status: Former Smoker    Packs/day: 1.00    Years: 25.00    Pack years: 25.00    Types: Cigarettes    Last attempt to quit: 07/16/2017    Years since quitting: 0.8  . Smokeless tobacco: Never Used  Substance and Sexual Activity  . Alcohol use: Yes    Alcohol/week: 12.0 standard drinks    Types: 12 Cans of beer per week    Comment: 12 cans of beer/week, mostly on weekends.  . Drug use: No  .  Sexual activity: Not on file

## 2018-07-02 ENCOUNTER — Ambulatory Visit (INDEPENDENT_AMBULATORY_CARE_PROVIDER_SITE_OTHER): Payer: 59 | Admitting: Orthopaedic Surgery

## 2018-07-02 ENCOUNTER — Encounter (INDEPENDENT_AMBULATORY_CARE_PROVIDER_SITE_OTHER): Payer: Self-pay | Admitting: Orthopaedic Surgery

## 2018-07-02 ENCOUNTER — Other Ambulatory Visit: Payer: Self-pay

## 2018-07-02 ENCOUNTER — Ambulatory Visit (INDEPENDENT_AMBULATORY_CARE_PROVIDER_SITE_OTHER): Payer: 59

## 2018-07-02 VITALS — Ht 71.0 in | Wt 185.0 lb

## 2018-07-02 DIAGNOSIS — Z981 Arthrodesis status: Secondary | ICD-10-CM

## 2018-07-02 NOTE — Progress Notes (Signed)
Post-Op Visit Note   Patient: Seth Mcmillan           Date of Birth: 01-02-1966           MRN: [id number] Visit Date: 07/02/2018 PCP: [name], DO   Assessment & Plan: Patient is happy the surgical result was cervical fusion.  He states his shoulder and arm is getting better.  Work slip given for work resumption on 07/13/2018 regular work without restrictions.  Patient can return as needed. Chief Complaint:  Chief Complaint  Patient presents with  . Neck - Pain    05/18/2018 C6-7, C7-T1 ACDF, Allograft, Plate   Visit Diagnoses:  1. History of fusion of cervical spine     Plan: Patient is happy with surgical result.  Work slip given for work resumption on 07/13/2018 without restrictions.  We can follow him up again on a as needed basis.  Follow-Up Instructions: No follow-ups on file.   Orders:  Orders Placed This Encounter  Procedures  . XR Cervical Spine 2 or 3 views   No orders of the defined types were placed in this encounter.   Imaging: No results found.  PMFS History: Patient Active Problem List   Diagnosis Date Noted  . Cervical spinal stenosis 05/18/2018  . Adjustment disorder with anxious mood 04/14/2018  . Protrusion of cervical intervertebral disc 03/12/2018  . Foraminal stenosis of cervical region 03/12/2018  . Other spondylosis with radiculopathy, cervical region 03/05/2018  . Nausea without vomiting 11/21/2017  . GERD (gastroesophageal reflux disease) 08/22/2016  . IBS (irritable bowel syndrome) 11/23/2015  . History of colonic polyps 11/23/2015  . AP (abdominal pain)   . Diarrhea 08/22/2015  . Abdominal pain 08/22/2015  . HTN (hypertension) 08/18/2012  . Hyperlipidemia 08/18/2012   Past Medical History:  Diagnosis Date  . Anxiety   . Arthritis   . Depression   . Fatty liver   . GERD (gastroesophageal reflux disease)   . Hyperlipidemia   . Hypertension   . IBS (irritable bowel syndrome)    Diarrhea type    Family History  Problem  Relation Age of Onset  . Heart disease Mother   . Heart disease Father   . Diabetes Brother   . Colon cancer Neg Hx     Past Surgical History:  Procedure Laterality Date  . ANTERIOR CERVICAL DECOMP/DISCECTOMY FUSION N/A 05/18/2018   Procedure: C6-7. C7-T1 ANTERIOR CERVICAL DECOMPRESSION/DISCECTOMY FUSION, ALLOGRAFT, PLATE;  Surgeon: Marybelle Killings, MD;  Location: [facility];  Service: Orthopedics;  Laterality: N/A;  . BIOPSY  09/05/2015   Procedure: BIOPSY;  Surgeon: Danie Binder, MD;  [facility];  Service: Endoscopy;;  random colon bx  . COLONOSCOPY N/A 09/05/2015   Dr. Oneida Alar: 4 simple adenomas removed.  Next colonoscopy 3 years.  Marland Kitchen KNEE SURGERY Right 1997   bursa sac  . POLYPECTOMY  09/05/2015   Procedure: POLYPECTOMY;  Surgeon: Danie Binder, MD;  [facility];  Service: Endoscopy;;  colon polyps  . SHOULDER ARTHROSCOPY WITH BICEPS TENDON REPAIR Right 2011  . TONSILECTOMY, ADENOIDECTOMY, BILATERAL MYRINGOTOMY AND TUBES     Social History   Occupational History  . Not on file  Tobacco Use  . Smoking status: Former Smoker    Packs/day: 1.00    Years: 25.00    Pack years: 25.00    Types: Cigarettes    Last attempt to quit: 07/16/2017    Years since quitting: 0.9  . Smokeless tobacco: Never Used  Substance and Sexual Activity  . Alcohol use: Yes    Alcohol/week: 12.0 standard drinks    Types: 12 Cans of beer per week    Comment: 12 cans of beer/week, mostly on weekends.  . Drug use: No  . Sexual activity: Not on file

## 2018-07-03 ENCOUNTER — Encounter (INDEPENDENT_AMBULATORY_CARE_PROVIDER_SITE_OTHER): Payer: Self-pay | Admitting: Orthopaedic Surgery

## 2018-07-03 DIAGNOSIS — Z981 Arthrodesis status: Secondary | ICD-10-CM | POA: Insufficient documentation

## 2018-08-11 DIAGNOSIS — M9903 Segmental and somatic dysfunction of lumbar region: Secondary | ICD-10-CM | POA: Diagnosis not present

## 2018-08-11 DIAGNOSIS — M9904 Segmental and somatic dysfunction of sacral region: Secondary | ICD-10-CM | POA: Diagnosis not present

## 2018-08-11 DIAGNOSIS — M62830 Muscle spasm of back: Secondary | ICD-10-CM | POA: Diagnosis not present

## 2018-08-13 DIAGNOSIS — M62830 Muscle spasm of back: Secondary | ICD-10-CM | POA: Diagnosis not present

## 2018-08-13 DIAGNOSIS — M9903 Segmental and somatic dysfunction of lumbar region: Secondary | ICD-10-CM | POA: Diagnosis not present

## 2018-08-13 DIAGNOSIS — M9904 Segmental and somatic dysfunction of sacral region: Secondary | ICD-10-CM | POA: Diagnosis not present

## 2018-09-08 ENCOUNTER — Encounter: Payer: Self-pay | Admitting: Gastroenterology

## 2018-09-16 ENCOUNTER — Other Ambulatory Visit: Payer: Self-pay

## 2018-09-16 ENCOUNTER — Ambulatory Visit (INDEPENDENT_AMBULATORY_CARE_PROVIDER_SITE_OTHER): Payer: 59 | Admitting: Family Medicine

## 2018-09-16 DIAGNOSIS — I10 Essential (primary) hypertension: Secondary | ICD-10-CM | POA: Diagnosis not present

## 2018-09-16 DIAGNOSIS — K219 Gastro-esophageal reflux disease without esophagitis: Secondary | ICD-10-CM | POA: Diagnosis not present

## 2018-09-16 DIAGNOSIS — F4322 Adjustment disorder with anxiety: Secondary | ICD-10-CM | POA: Diagnosis not present

## 2018-09-16 DIAGNOSIS — E785 Hyperlipidemia, unspecified: Secondary | ICD-10-CM | POA: Diagnosis not present

## 2018-09-16 MED ORDER — ROSUVASTATIN CALCIUM 40 MG PO TABS: 40.0000 mg | ORAL_TABLET | Freq: Every day | ORAL | 3 refills | Status: DC

## 2018-09-16 MED ORDER — AMLODIPINE BESYLATE 5 MG PO TABS: 5.0000 mg | ORAL_TABLET | Freq: Every day | ORAL | 1 refills | Status: DC

## 2018-09-16 NOTE — Progress Notes (Signed)
Telephone visit  Subjective: CC: HTN, HLD, mood PCP: [name], DO OAC:ZYSA H Detty is a 53 y.o. male calls for telephone consult today. Patient provides verbal consent for consult held via phone.  Location of patient: home Location of provider: [facility] Others present for call: none  1.  Hypertension with hyperlipidemia Patient reports compliance with Norvasc 5 mg daily and Crestor 40 mg daily.  No chest pain, shortness of breath, lower extremity edema, dizziness.  Overall he "is feeling very good".  2.  Mood disorder Patient reports good control of mood with Lexapro 10 mg daily.  No issues.  He does not need refills at this time.  Overall he reports that neck pain has gotten a lot better since surgery.  He is no longer on any of the pain controlling medicines.   ROS: Per HPI  No Known Allergies Past Medical History:  Diagnosis Date  . Anxiety   . Arthritis   . Depression   . Fatty liver   . GERD (gastroesophageal reflux disease)   . Hyperlipidemia   . Hypertension   . IBS (irritable bowel syndrome)    Diarrhea type    Current Outpatient Medications:  .  amLODipine (NORVASC) 5 MG tablet, Take 1 tablet (5 mg total) by mouth daily., Disp: 90 tablet, Rfl: 1 .  Ascorbic Acid (VITAMIN C) 1000 MG tablet, Take 1,000 mg by mouth daily., Disp: , Rfl:  .  aspirin EC 81 MG tablet, Take 81 mg by mouth daily., Disp: , Rfl:  .  cholecalciferol (VITAMIN D3) 25 MCG (1000 UT) tablet, Take 1,000 Units by mouth daily., Disp: , Rfl:  .  escitalopram (LEXAPRO) 10 MG tablet, Take 1 tablet (10 mg total) by mouth daily., Disp: 90 tablet, Rfl: 3 .  omeprazole (PRILOSEC) 40 MG capsule, Take 1 capsule (40 mg total) by mouth daily., Disp: 90 capsule, Rfl: 3 .  rosuvastatin (CRESTOR) 40 MG tablet, Take 1 tablet (40 mg total) by mouth daily., Disp: 90 tablet, Rfl: 3 .  VIBERZI 100 MG TABS, TAKE 1 TABLET BY MOUTH TWICE DAILY WITH FOOD (Patient taking differently: Take 100 mg by mouth 2 (two) times  daily. ), Disp: 60 tablet, Rfl: 5 .  vitamin B-12 (CYANOCOBALAMIN) 1000 MCG tablet, Take 1,000 mcg by mouth daily., Disp: , Rfl:  .  vitamin E 400 UNIT capsule, Take 400 Units by mouth daily., Disp: , Rfl:   Assessment/ Plan: 53 y.o. male   1. Essential hypertension Doing well.  Norvasc refilled - amLODipine (NORVASC) 5 MG tablet; Take 1 tablet (5 mg total) by mouth daily.  Dispense: 90 tablet; Refill: 1  2. Hyperlipidemia, unspecified hyperlipidemia type Stable.  Refill Crestor - rosuvastatin (CRESTOR) 40 MG tablet; Take 1 tablet (40 mg total) by mouth daily.  Dispense: 90 tablet; Refill: 3  3.  Gastroesophageal reflux disease without esophagitis Controlled.  No refills of Prilosec needed  4. Adjustment disorder with anxious mood Controlled.  Stable on Lexapro.  No refills needed   Start time: 11:08am End time: 11:12am  Total time spent on patient care (including telephone call/ virtual visit): 11 minutes  [place], [place] [phone]

## 2018-10-30 ENCOUNTER — Encounter (HOSPITAL_COMMUNITY): Payer: Self-pay | Admitting: *Deleted

## 2018-10-30 ENCOUNTER — Emergency Department (HOSPITAL_COMMUNITY): Payer: 59

## 2018-10-30 ENCOUNTER — Encounter (HOSPITAL_COMMUNITY): Payer: Self-pay

## 2018-10-30 ENCOUNTER — Other Ambulatory Visit: Payer: Self-pay

## 2018-10-30 ENCOUNTER — Emergency Department (HOSPITAL_COMMUNITY)
Admission: EM | Admit: 2018-10-30 | Discharge: 2018-10-31 | Disposition: A | Discharge status: Home / Self Care | Payer: 59 | Attending: Emergency Medicine | Admitting: Emergency Medicine

## 2018-10-30 ENCOUNTER — Ambulatory Visit (HOSPITAL_COMMUNITY)
Admission: EM | Admit: 2018-10-30 | Discharge: 2018-10-30 | Disposition: A | Discharge status: Home / Self Care | Payer: 59 | Source: Home / Self Care

## 2018-10-30 DIAGNOSIS — R110 Nausea: Secondary | ICD-10-CM | POA: Diagnosis not present

## 2018-10-30 DIAGNOSIS — I10 Essential (primary) hypertension: Secondary | ICD-10-CM | POA: Insufficient documentation

## 2018-10-30 DIAGNOSIS — Z79899 Other long term (current) drug therapy: Secondary | ICD-10-CM | POA: Insufficient documentation

## 2018-10-30 DIAGNOSIS — Z87891 Personal history of nicotine dependence: Secondary | ICD-10-CM | POA: Insufficient documentation

## 2018-10-30 DIAGNOSIS — Z7982 Long term (current) use of aspirin: Secondary | ICD-10-CM | POA: Diagnosis not present

## 2018-10-30 DIAGNOSIS — R1011 Right upper quadrant pain: Secondary | ICD-10-CM | POA: Diagnosis not present

## 2018-10-30 LAB — URINALYSIS, ROUTINE W REFLEX MICROSCOPIC
Bilirubin Urine: NEGATIVE
Glucose, UA: NEGATIVE mg/dL
Hgb urine dipstick: NEGATIVE
Ketones, ur: 5 mg/dL — AB
Leukocytes,Ua: NEGATIVE
Nitrite: NEGATIVE
Protein, ur: 30 mg/dL — AB
Specific Gravity, Urine: 1.026 (ref 1.005–1.030)
pH: 6 (ref 5.0–8.0)

## 2018-10-30 LAB — COMPREHENSIVE METABOLIC PANEL
ALT: 81 U/L — ABNORMAL HIGH (ref 0–44)
AST: 180 U/L — ABNORMAL HIGH (ref 15–41)
Albumin: 4.4 g/dL (ref 3.5–5.0)
Alkaline Phosphatase: 78 U/L (ref 38–126)
Anion gap: 13 (ref 5–15)
BUN: 15 mg/dL (ref 6–20)
CO2: 21 mmol/L — ABNORMAL LOW (ref 22–32)
Calcium: 9.3 mg/dL (ref 8.9–10.3)
Chloride: 103 mmol/L (ref 98–111)
Creatinine, Ser: 1.21 mg/dL (ref 0.61–1.24)
GFR calc Af Amer: >60 mL/min (ref >60)
GFR calc non Af Amer: >60 mL/min (ref >60)
Glucose, Bld: 130 mg/dL — ABNORMAL HIGH (ref 70–99)
Potassium: 5.1 mmol/L (ref 3.5–5.1)
Sodium: 137 mmol/L (ref 135–145)
Total Bilirubin: 0.9 mg/dL (ref 0.3–1.2)
Total Protein: 7.1 g/dL (ref 6.5–8.1)

## 2018-10-30 LAB — CBC
HCT: 45.2 % (ref 39.0–52.0)
Hemoglobin: 15.1 g/dL (ref 13.0–17.0)
MCH: 31.3 pg (ref 26.0–34.0)
MCHC: 33.4 g/dL (ref 30.0–36.0)
MCV: 93.6 fL (ref 80.0–100.0)
Platelets: 269 10*3/uL (ref 150–400)
RBC: 4.83 MIL/uL (ref 4.22–5.81)
RDW: 12.5 % (ref 11.5–15.5)
WBC: 13.6 10*3/uL — ABNORMAL HIGH (ref 4.0–10.5)
nRBC: 0 % (ref 0.0–0.2)

## 2018-10-30 LAB — LIPASE, BLOOD: Lipase: 26 U/L (ref 11–51)

## 2018-10-30 MED ORDER — FENTANYL CITRATE (PF) 100 MCG/2ML IJ SOLN
50.0000 ug | INTRAMUSCULAR | Status: DC | PRN
  Administered 2018-10-30: 50 ug via INTRAVENOUS
  Filled 2018-10-30: qty 2

## 2018-10-30 MED ORDER — ONDANSETRON HCL 4 MG/2ML IJ SOLN
4.0000 mg | Freq: Once | INTRAMUSCULAR | Status: AC
  Administered 2018-10-30: 4 mg via INTRAVENOUS
  Filled 2018-10-30: qty 2

## 2018-10-30 MED ORDER — IOHEXOL 300 MG/ML  SOLN
100.0000 mL | Freq: Once | INTRAMUSCULAR | Status: AC | PRN
  Administered 2018-10-30: 23:00:00 100 mL via INTRAVENOUS

## 2018-10-30 MED ORDER — SODIUM CHLORIDE 0.9% FLUSH
3.0000 mL | Freq: Once | INTRAVENOUS | Status: AC
  Administered 2018-10-30: 3 mL via INTRAVENOUS

## 2018-10-30 NOTE — ED Notes (Signed)
Pt given specimen cup. 

## 2018-10-30 NOTE — ED Provider Notes (Signed)
[facility]    CSN: [id number] Arrival date & time: 10/30/18  [place]      History   Chief Complaint Chief Complaint  Patient presents with  . Abdominal Pain  . Weakness    HPI Seth Mcmillan is a 53 y.o. male.   Seth Mcmillan presents with complaints of abdominal pain, which started today and has worsened. Cramping sensation. Makes him feel short of breath even. Mild nausea, no vomiting. History  Of IBS but this doesn't feel like anything he has experienced with that. Had a normal BM this morning. No fever when he checked it at work, but felt sweaty even. No chest pain  Or back pain. No cough. Has eaten some when never worsened or improved the pain. Ate fried chicken last night. Pain 7/10 in intensity. Has noticed some odor to urine but otherwise no urinary symptoms. Drinks approximately a 12pack of alcohol every weekend, sometimes drinks beer after work as well. No previous abdominal surgery. History of gerd and fatty liver.     ROS per HPI, negative if not otherwise mentioned.      Past Medical History:  Diagnosis Date  . Anxiety   . Arthritis   . Depression   . Fatty liver   . GERD (gastroesophageal reflux disease)   . Hyperlipidemia   . Hypertension   . IBS (irritable bowel syndrome)    Diarrhea type    Patient Active Problem List   Diagnosis Date Noted  . History of fusion of cervical spine 07/03/2018  . Cervical spinal stenosis 05/18/2018  . Adjustment disorder with anxious mood 04/14/2018  . Nausea without vomiting 11/21/2017  . GERD (gastroesophageal reflux disease) 08/22/2016  . IBS (irritable bowel syndrome) 11/23/2015  . History of colonic polyps 11/23/2015  . AP (abdominal pain)   . Diarrhea 08/22/2015  . Abdominal pain 08/22/2015  . HTN (hypertension) 08/18/2012  . Hyperlipidemia 08/18/2012    Past Surgical History:  Procedure Laterality Date  . ANTERIOR CERVICAL DECOMP/DISCECTOMY FUSION N/A 05/18/2018   Procedure: C6-7. C7-T1 ANTERIOR  CERVICAL DECOMPRESSION/DISCECTOMY FUSION, ALLOGRAFT, PLATE;  Surgeon: Marybelle Killings, MD;  Location: [facility];  Service: Orthopedics;  Laterality: N/A;  . BIOPSY  09/05/2015   Procedure: BIOPSY;  Surgeon: Danie Binder, MD;  [facility];  Service: Endoscopy;;  random colon bx  . COLONOSCOPY N/A 09/05/2015   Dr. Oneida Alar: 4 simple adenomas removed.  Next colonoscopy 3 years.  Marland Kitchen KNEE SURGERY Right 1997   bursa sac  . POLYPECTOMY  09/05/2015   Procedure: POLYPECTOMY;  Surgeon: Danie Binder, MD;  [facility];  Service: Endoscopy;;  colon polyps  . SHOULDER ARTHROSCOPY WITH BICEPS TENDON REPAIR Right 2011  . TONSILECTOMY, ADENOIDECTOMY, BILATERAL MYRINGOTOMY AND TUBES         Home Medications    Prior to Admission medications   Medication Sig Start Date End Date Taking? Authorizing Provider  amLODipine (NORVASC) 5 MG tablet Take 1 tablet (5 mg total) by mouth daily. 09/16/18   [name], DO  Ascorbic Acid (VITAMIN C) 1000 MG tablet Take 1,000 mg by mouth daily.    [provider]  aspirin EC 81 MG tablet Take 81 mg by mouth daily.    [provider]  cholecalciferol (VITAMIN D3) 25 MCG (1000 UT) tablet Take 1,000 Units by mouth daily.    [provider]  escitalopram (LEXAPRO) 10 MG tablet Take 1 tablet (10 mg total) by mouth daily. 04/14/18  Ronnie Doss M, DO  omeprazole (PRILOSEC) 40 MG capsule Take 1 capsule (40 mg total) by mouth daily. 04/14/18   [name], DO  rosuvastatin (CRESTOR) 40 MG tablet Take 1 tablet (40 mg total) by mouth daily. 09/16/18   Gottschalk, Ashly M, DO  VIBERZI 100 MG TABS TAKE 1 TABLET BY MOUTH TWICE DAILY WITH FOOD Patient taking differently: Take 100 mg by mouth 2 (two) times daily.  04/16/18   Carlis Stable, NP  vitamin B-12 (CYANOCOBALAMIN) 1000 MCG tablet Take 1,000 mcg by mouth daily.    [provider]  vitamin E 400 UNIT capsule Take 400 Units by mouth daily.    [provider]    Family History Family History  Problem Relation Age of Onset  . Heart disease Mother   . Heart disease Father   . Diabetes Brother   . Colon cancer Neg Hx     Social History Social History   Tobacco Use  . Smoking status: Former Smoker    Packs/day: 1.00    Years: 25.00    Pack years: 25.00    Types: Cigarettes    Quit date: 07/16/2017    Years since quitting: 1.2  . Smokeless tobacco: Never Used  Substance Use Topics  . Alcohol use: Yes    Alcohol/week: 12.0 standard drinks    Types: 12 Cans of beer per week    Comment: 12 cans of beer/week, mostly on weekends.  . Drug use: No     Allergies   Patient has no known allergies.   Review of Systems Review of Systems   Physical Exam Triage Vital Signs ED Triage Vitals  Enc Vitals Group     BP 10/30/18 1850 (!) 148/92     Pulse Rate 10/30/18 1850 65     Resp 10/30/18 1850 18     Temp 10/30/18 1850 97.7 F (36.5 C)     Temp Source 10/30/18 1850 Oral     SpO2 10/30/18 1850 100 %     Weight --      Height --      Head Circumference --      Peak Flow --      Pain Score 10/30/18 1853 6     Pain Loc --      Pain Edu? --      Excl. in [place]? --    No data found.  Updated Vital Signs BP (!) 148/92 (BP Location: Right Arm)   Pulse 65   Temp 97.7 F (36.5 C) (Oral)   Resp 18   SpO2 100%   Visual Acuity Right Eye Distance:   Left Eye Distance:   Bilateral Distance:    Right Eye Near:   Left Eye Near:    Bilateral Near:     Physical Exam Constitutional:      Appearance: He is well-developed.  Cardiovascular:     Rate and Rhythm: Normal rate and regular rhythm.  Pulmonary:     Effort: Pulmonary effort is normal.     Breath sounds: Normal breath sounds.  Abdominal:     Tenderness: There is abdominal tenderness in the right upper quadrant. Positive signs include Murphy's sign.     Comments: Patient very sensitive with exam to RUQ, jumping on table even   Skin:    General: Skin is warm and dry.   Neurological:     Mental Status: He is alert and oriented to person, place, and time.  UC Treatments / Results  Labs (all labs ordered are listed, but only abnormal results are displayed) Labs Reviewed - No data to display  EKG   Radiology No results found.  Procedures Procedures (including critical care time)  Medications Ordered in UC Medications - No data to display  Initial Impression / Assessment and Plan / UC Course  I have reviewed the triage vital signs and the nursing notes.  Pertinent labs & imaging results that were available during my care of the patient were reviewed by me and considered in my medical decision making (see chart for details).     Concern for cholecystitis vs cholelithiasis. Afebrile. Quite impressive RUQ exam. I do recommend further evaluation in the er at this time. Patient verbalized understanding and agreeable to plan. Patient provided assistance to ER as he had been dropped of here.   Final Clinical Impressions(s) / UC Diagnoses   Final diagnoses:  RUQ pain     Discharge Instructions     I do recommend going to the ER for further evaluation of your right upper abdominal pain   ED Prescriptions    None     Controlled Substance Prescriptions [place] Controlled Substance Registry consulted? Not Applicable   Zigmund Gottron, NP 10/30/18 1928

## 2018-10-30 NOTE — Discharge Instructions (Signed)
I do recommend going to the ER for further evaluation of your right upper abdominal pain

## 2018-10-30 NOTE — ED Triage Notes (Signed)
Pt present abdominal pain with weakness. Symptoms started today at 3:30. Pt states he is having nausea with the stomach pain

## 2018-10-30 NOTE — ED Triage Notes (Signed)
Pt started having abd pain earlier today while at work. Started sweating and thought maybe he had a fever. Went to UC and was told to follow up here. Tender to RUQ and nausea

## 2018-10-30 NOTE — ED Provider Notes (Signed)
MSE was initiated and I personally evaluated the patient and placed orders (if any) at  9:15 PM on October 30, 2018.  The patient appears stable so that the remainder of the MSE may be completed by another provider.  Seth Mcmillan is a 53 y.o. male sent from urgent care for evaluation of right upper quadrant abdominal pain starting today, worsening throughout the day, had some chills at work.  At urgent care patient was impressively tender in the right upper quadrant with associated nausea.  On arrival patient appears somewhat pale, but vitals are stable.  He is focally tender in the right upper quadrant, abdominal labs, right upper quadrant ultrasound and pain and nausea medication ordered.   Jacqlyn Larsen, PA-C 10/30/18 2116    Tegeler, Gwenyth Allegra, MD 10/31/18 Benancio Deeds

## 2018-10-31 MED ORDER — SUCRALFATE 1 GM/10ML PO SUSP: 1.0000 g | Freq: Three times a day (TID) | ORAL | 0 refills | Status: DC

## 2018-10-31 NOTE — Discharge Instructions (Signed)
Please call your GI doctor to schedule follow-up for further testing.

## 2018-10-31 NOTE — ED Provider Notes (Signed)
[facility] EMERGENCY DEPARTMENT Provider Note   CSN: [id number] Arrival date & time: 10/30/18  1936    History   Chief Complaint Chief Complaint  Patient presents with   Abdominal Pain    HPI Seth Mcmillan is a 53 y.o. male.     Patient presents to the emergency department for evaluation of abdominal pain.  He first went to urgent care and was referred to the ER.  Patient had onset of right upper quadrant pain yesterday evening.  Pain was persistent and severe.  He had nausea but no vomiting.  No diarrhea or constipation.     Past Medical History:  Diagnosis Date   Anxiety    Arthritis    Depression    Fatty liver    GERD (gastroesophageal reflux disease)    Hyperlipidemia    Hypertension    IBS (irritable bowel syndrome)    Diarrhea type    Patient Active Problem List   Diagnosis Date Noted   History of fusion of cervical spine 07/03/2018   Cervical spinal stenosis 05/18/2018   Adjustment disorder with anxious mood 04/14/2018   Nausea without vomiting 11/21/2017   GERD (gastroesophageal reflux disease) 08/22/2016   IBS (irritable bowel syndrome) 11/23/2015   History of colonic polyps 11/23/2015   AP (abdominal pain)    Diarrhea 08/22/2015   Abdominal pain 08/22/2015   HTN (hypertension) 08/18/2012   Hyperlipidemia 08/18/2012    Past Surgical History:  Procedure Laterality Date   ANTERIOR CERVICAL DECOMP/DISCECTOMY FUSION N/A 05/18/2018   Procedure: C6-7. C7-T1 ANTERIOR CERVICAL DECOMPRESSION/DISCECTOMY FUSION, ALLOGRAFT, PLATE;  Surgeon: Marybelle Killings, MD;  Location: [facility];  Service: Orthopedics;  Laterality: N/A;   BIOPSY  09/05/2015   Procedure: BIOPSY;  Surgeon: Danie Binder, MD;  [facility];  Service: Endoscopy;;  random colon bx   COLONOSCOPY N/A 09/05/2015   Dr. Oneida Alar: 4 simple adenomas removed.  Next colonoscopy 3 years.   KNEE SURGERY Right 1997   bursa sac   POLYPECTOMY  09/05/2015   Procedure: POLYPECTOMY;  Surgeon: Danie Binder, MD;  [facility];  Service: Endoscopy;;  colon polyps   SHOULDER ARTHROSCOPY WITH BICEPS TENDON REPAIR Right 2011   TONSILECTOMY, ADENOIDECTOMY, BILATERAL MYRINGOTOMY AND TUBES          Home Medications    Prior to Admission medications   Medication Sig Start Date End Date Taking? Authorizing Provider  amLODipine (NORVASC) 5 MG tablet Take 1 tablet (5 mg total) by mouth daily. 09/16/18   [name], DO  Ascorbic Acid (VITAMIN C) 1000 MG tablet Take 1,000 mg by mouth daily.    [provider]  aspirin EC 81 MG tablet Take 81 mg by mouth daily.    [provider]  cholecalciferol (VITAMIN D3) 25 MCG (1000 UT) tablet Take 1,000 Units by mouth daily.    [provider]  escitalopram (LEXAPRO) 10 MG tablet Take 1 tablet (10 mg total) by mouth daily. 04/14/18   [name], DO  omeprazole (PRILOSEC) 40 MG capsule Take 1 capsule (40 mg total) by mouth daily. 04/14/18   [name], DO  rosuvastatin (CRESTOR) 40 MG tablet Take 1 tablet (40 mg total) by mouth daily. 09/16/18   [name], DO  sucralfate (CARAFATE) 1 GM/10ML suspension Take 10 mLs (1 g total) by mouth 4 (four) times daily -  with meals and at bedtime. 10/31/18   Orpah Greek, MD  VIBERZI 100  MG TABS TAKE 1 TABLET BY MOUTH TWICE DAILY WITH FOOD Patient taking differently: Take 100 mg by mouth 2 (two) times daily.  04/16/18   Carlis Stable, NP  vitamin B-12 (CYANOCOBALAMIN) 1000 MCG tablet Take 1,000 mcg by mouth daily.    [provider]  vitamin E 400 UNIT capsule Take 400 Units by mouth daily.    [provider]    Family History Family History  Problem Relation Age of Onset   Heart disease Mother    Heart disease Father    Diabetes Brother    Colon cancer Neg Hx     Social History Social History   Tobacco Use   Smoking status: Former Smoker    Packs/day: 1.00    Years:  25.00    Pack years: 25.00    Types: Cigarettes    Quit date: 07/16/2017    Years since quitting: 1.2   Smokeless tobacco: Never Used  Substance Use Topics   Alcohol use: Yes    Alcohol/week: 12.0 standard drinks    Types: 12 Cans of beer per week    Comment: 12 cans of beer/week, mostly on weekends.   Drug use: No     Allergies   Patient has no known allergies.   Review of Systems Review of Systems  Gastrointestinal: Positive for abdominal pain and nausea.  All other systems reviewed and are negative.    Physical Exam Updated Vital Signs BP (!) 139/93 (BP Location: Right Arm)    Pulse (!) 57    Temp 97.9 F (36.6 C) (Oral)    Resp 17    Ht 5\' 11"  (1.803 m)    Wt 86.2 kg    SpO2 99%    BMI 26.50 kg/m   Physical Exam Vitals signs and nursing note reviewed.  Constitutional:      General: He is not in acute distress.    Appearance: Normal appearance. He is well-developed.  HENT:     Head: Normocephalic and atraumatic.     Right Ear: Hearing normal.     Left Ear: Hearing normal.     Nose: Nose normal.  Eyes:     Conjunctiva/sclera: Conjunctivae normal.     Pupils: Pupils are equal, round, and reactive to light.  Neck:     Musculoskeletal: Normal range of motion and neck supple.  Cardiovascular:     Rate and Rhythm: Regular rhythm.     Heart sounds: S1 normal and S2 normal. No murmur. No friction rub. No gallop.   Pulmonary:     Effort: Pulmonary effort is normal. No respiratory distress.     Breath sounds: Normal breath sounds.  Chest:     Chest wall: No tenderness.  Abdominal:     General: Bowel sounds are normal.     Palpations: Abdomen is soft.     Tenderness: There is no abdominal tenderness. There is no guarding or rebound. Negative signs include Murphy's sign and McBurney's sign.     Hernia: No hernia is present.  Musculoskeletal: Normal range of motion.  Skin:    General: Skin is warm and dry.     Findings: No rash.  Neurological:     Mental  Status: He is alert and oriented to person, place, and time.     GCS: GCS eye subscore is 4. GCS verbal subscore is 5. GCS motor subscore is 6.     Cranial Nerves: No cranial nerve deficit.     Sensory: No sensory deficit.  Coordination: Coordination normal.  Psychiatric:        Speech: Speech normal.        Behavior: Behavior normal.        Thought Content: Thought content normal.      ED Treatments / Results  Labs (all labs ordered are listed, but only abnormal results are displayed) Labs Reviewed  COMPREHENSIVE METABOLIC PANEL - Abnormal; Notable for the following components:      Result Value   CO2 21 (*)    Glucose, Bld 130 (*)    AST 180 (*)    ALT 81 (*)    All other components within normal limits  CBC - Abnormal; Notable for the following components:   WBC 13.6 (*)    All other components within normal limits  URINALYSIS, ROUTINE W REFLEX MICROSCOPIC - Abnormal; Notable for the following components:   Ketones, ur 5 (*)    Protein, ur 30 (*)    Bacteria, UA RARE (*)    All other components within normal limits  LIPASE, BLOOD    EKG None  Radiology Ct Abdomen Pelvis W Contrast  Result Date: 10/30/2018 CLINICAL DATA:  Abdominal pain. Concern 53 year old male with for acute appendicitis. EXAM: CT ABDOMEN AND PELVIS WITH CONTRAST TECHNIQUE: Multidetector CT imaging of the abdomen and pelvis was performed using the standard protocol following bolus administration of intravenous contrast. CONTRAST:  148mL OMNIPAQUE IOHEXOL 300 MG/ML  SOLN COMPARISON:  Right upper quadrant ultrasound dated 10/30/2018 FINDINGS: Lower chest: The visualized lung bases are clear. No intra-abdominal free air or free fluid. Hepatobiliary: Apparent fatty infiltration of the liver. No intrahepatic biliary ductal dilatation. Subcentimeter hypodense focus in the inferior right lobe of the liver is not characterized. The gallbladder is unremarkable. Pancreas: Unremarkable. No pancreatic ductal  dilatation or surrounding inflammatory changes. Spleen: Normal in size without focal abnormality. Adrenals/Urinary Tract: The adrenal glands are unremarkable. The kidneys, visualized ureters, and urinary bladder appear unremarkable. Stomach/Bowel: There is no bowel obstruction or active inflammation. The appendix is normal. Vascular/Lymphatic: Mild atherosclerotic calcification the abdominal aorta. The IVC is unremarkable. No portal venous gas. There is no adenopathy. Reproductive: The prostate and seminal vesicles are grossly unremarkable. Other: None Musculoskeletal: No acute or significant osseous findings. IMPRESSION: 1. No acute intra-abdominal or pelvic pathology. No bowel obstruction or active inflammation. Normal appendix. 2. Fatty liver. 3. Aortic Atherosclerosis (ICD10-I70.0). Electronically Signed   By: Anner Crete M.D.   On: 10/30/2018 23:54   US Abdomen Limited Ruq  Result Date: 10/30/2018 CLINICAL DATA:  Right upper quadrant pain EXAM: ULTRASOUND ABDOMEN LIMITED RIGHT UPPER QUADRANT COMPARISON:  None. FINDINGS: Gallbladder: No gallstones or wall thickening visualized. No sonographic Murphy sign noted by sonographer. The gallbladder appears to be mildly distended. Common bile duct: Diameter: 5 mm. Liver: Increased echotexture seen throughout. No focal abnormality or biliary ductal dilatation. Portal vein is patent on color Doppler imaging with normal direction of blood flow towards the liver. Other: None. IMPRESSION: Hepatic steatosis. Mildly distended gallbladder without evidence of cholecystitis. Electronically Signed   By: Prudencio Pair M.D.   On: 10/30/2018 22:32    Procedures Procedures (including critical care time)  Medications Ordered in ED Medications  fentaNYL (SUBLIMAZE) injection 50 mcg (50 mcg Intravenous Given 10/30/18 2118)  sodium chloride flush (NS) 0.9 % injection 3 mL (3 mLs Intravenous Given 10/30/18 2119)  ondansetron (ZOFRAN) injection 4 mg (4 mg Intravenous Given  10/30/18 2118)  iohexol (OMNIPAQUE) 300 MG/ML solution 100 mL (100 mLs Intravenous Contrast Given 10/30/18  2304)     Initial Impression / Assessment and Plan / ED Course  I have reviewed the triage vital signs and the nursing notes.  Pertinent labs & imaging results that were available during my care of the patient were reviewed by me and considered in my medical decision making (see chart for details).        Patient presents to the ER for evaluation of abdominal pain.  Pain was right upper quadrant in nature when it started.  He had persistent and moderate to severe pain earlier.  He was given fentanyl earlier and the pain did improve.  He feels much improvement at this time.  Lab work was normal.  Patient also underwent CT scan and right upper quadrant ultrasound, both of which were unremarkable.  Patient reports that he has a GI doctor in [place].  Encourage follow-up to determine if he needs HIDA scan, endoscopy, etc.  He has already on IBS meds and Protonix.  Final Clinical Impressions(s) / ED Diagnoses   Final diagnoses:  RUQ pain    ED Discharge Orders         Ordered    sucralfate (CARAFATE) 1 GM/10ML suspension  3 times daily with meals & bedtime     10/31/18 0436           Orpah Greek, MD 10/31/18 [id number]

## 2018-11-05 ENCOUNTER — Other Ambulatory Visit: Payer: Self-pay

## 2018-11-05 ENCOUNTER — Ambulatory Visit: Payer: 59 | Admitting: Gastroenterology

## 2018-11-05 ENCOUNTER — Encounter: Payer: Self-pay | Admitting: Gastroenterology

## 2018-11-05 VITALS — BP 138/86 | HR 67 | Temp 96.8°F | Ht 71.0 in | Wt 196.0 lb

## 2018-11-05 DIAGNOSIS — K760 Fatty (change of) liver, not elsewhere classified: Secondary | ICD-10-CM

## 2018-11-05 DIAGNOSIS — R1011 Right upper quadrant pain: Secondary | ICD-10-CM | POA: Diagnosis not present

## 2018-11-05 DIAGNOSIS — Z86010 Personal history of colonic polyps: Secondary | ICD-10-CM

## 2018-11-05 MED ORDER — RIFAXIMIN 550 MG PO TABS: 550.0000 mg | ORAL_TABLET | Freq: Three times a day (TID) | ORAL | 0 refills | Status: AC

## 2018-11-05 NOTE — Patient Instructions (Signed)
We need to stop Viberzi for now, as I am concerned you are experiencing side effects from this.  It's best to avoid alcohol now for the health of your liver.   I would like to try a course of Xifaxan, which is indicated for IBS diarrhea treatment. This is for 2 weeks. Let us know how you do after this!  We are arranging a colonoscopy and upper endoscopy with Dr. Oneida Alar in the near future!  It was a pleasure to see you today. I want to create trusting relationships with patients to provide genuine, compassionate, and quality care. I value your feedback. If you receive a survey regarding your visit,  I greatly appreciate you taking time to fill this out.   Annitta Needs, PhD, ANP-BC [facility]

## 2018-11-05 NOTE — Progress Notes (Signed)
Referring Provider: [name], DO Primary Care Physician:  [name], DO Primary GI: Dr. Oneida Alar   Chief Complaint  Patient presents with  . Abdominal Pain    ruq, pain on and off; wen to ED 8/14    HPI:   Seth Mcmillan is a 53 y.o. male presenting today with a history of IBS, fatty liver, elevated transaminases, adenomas and due for surveillance now.   [place] RUQ with fatty liver, mildly distended gallbladder without cholecystitis. CT abd/pelvis with contrast was normal. Transaminases increasing, specifically AST (180) compared to ALT (81) recently during flare of RUQ Pain. Hep B and C negative.   Abdominal pain out of the blue on way to work last week. RUQ pain. Sweating. Thinks had had ETOH night before.  Went to Urgent Care then sent to ED. Taking carafate now. Improved over next few days. Had some pain with movement while working in yard. No prior EGD. Prilosec daily. Controls GERD. No dysphagia. Pain resolved now. NSAIDs just for headache. No rectal bleeding.   Viberzi 100 mg BID. Bowel movements: about 2 a day. 75 mg BID didn't help as much. A few beers each night, possibly more on weekends. Now working 7 days a week so unable to drink as much on weekends.   Past Medical History:  Diagnosis Date  . Anxiety   . Arthritis   . Depression   . Fatty liver   . GERD (gastroesophageal reflux disease)   . Hyperlipidemia   . Hypertension   . IBS (irritable bowel syndrome)    Diarrhea type    Past Surgical History:  Procedure Laterality Date  . ANTERIOR CERVICAL DECOMP/DISCECTOMY FUSION N/A 05/18/2018   Procedure: C6-7. C7-T1 ANTERIOR CERVICAL DECOMPRESSION/DISCECTOMY FUSION, ALLOGRAFT, PLATE;  Surgeon: Marybelle Killings, MD;  Location: [place];  Service: Orthopedics;  Laterality: N/A;  . BIOPSY  09/05/2015   Procedure: BIOPSY;  Surgeon: Danie Binder, MD;  [facility];  Service: Endoscopy;;  random colon bx  . COLONOSCOPY N/A 09/05/2015   Dr. Oneida Alar: 4  simple adenomas removed.  Next colonoscopy 3 years.  Marland Kitchen KNEE SURGERY Right 1997   bursa sac  . POLYPECTOMY  09/05/2015   Procedure: POLYPECTOMY;  Surgeon: Danie Binder, MD;  [facility];  Service: Endoscopy;;  colon polyps  . SHOULDER ARTHROSCOPY WITH BICEPS TENDON REPAIR Right 2011  . TONSILECTOMY, ADENOIDECTOMY, BILATERAL MYRINGOTOMY AND TUBES      Current Outpatient Medications  Medication Sig Dispense Refill  . amLODipine (NORVASC) 5 MG tablet Take 1 tablet (5 mg total) by mouth daily. 90 tablet 1  . Ascorbic Acid (VITAMIN C) 1000 MG tablet Take 1,000 mg by mouth daily.    Marland Kitchen aspirin EC 81 MG tablet Take 81 mg by mouth daily.    . cholecalciferol (VITAMIN D3) 25 MCG (1000 UT) tablet Take 1,000 Units by mouth daily.    Marland Kitchen escitalopram (LEXAPRO) 10 MG tablet Take 1 tablet (10 mg total) by mouth daily. 90 tablet 3  . omeprazole (PRILOSEC) 40 MG capsule Take 1 capsule (40 mg total) by mouth daily. 90 capsule 3  . rosuvastatin (CRESTOR) 40 MG tablet Take 1 tablet (40 mg total) by mouth daily. 90 tablet 3  . sucralfate (CARAFATE) 1 GM/10ML suspension Take 10 mLs (1 g total) by mouth 4 (four) times daily -  with meals and at bedtime. 420 mL 0  . VIBERZI 100 MG TABS TAKE 1 TABLET BY MOUTH TWICE DAILY  WITH FOOD (Patient taking differently: Take 100 mg by mouth 2 (two) times daily. ) 60 tablet 5  . vitamin B-12 (CYANOCOBALAMIN) 1000 MCG tablet Take 1,000 mcg by mouth daily.    . vitamin E 400 UNIT capsule Take 400 Units by mouth daily.     No current facility-administered medications for this visit.     Allergies as of 11/05/2018  . (No Known Allergies)    Family History  Problem Relation Age of Onset  . Heart disease Mother   . Heart disease Father   . Diabetes Brother   . Colon cancer Neg Hx     Social History   Socioeconomic History  . Marital status: Divorced    Spouse name: Not on file  . Number of children: Not on file  . Years of education: Not on file  .  Highest education level: Not on file  Occupational History  . Not on file  Social Needs  . Financial resource strain: Not on file  . Food insecurity    Worry: Not on file    Inability: Not on file  . Transportation needs    Medical: Not on file    Non-medical: Not on file  Tobacco Use  . Smoking status: Former Smoker    Packs/day: 1.00    Years: 25.00    Pack years: 25.00    Types: Cigarettes    Quit date: 07/16/2017    Years since quitting: 1.3  . Smokeless tobacco: Never Used  Substance and Sexual Activity  . Alcohol use: Yes    Alcohol/week: 12.0 standard drinks    Types: 12 Cans of beer per week    Comment: 12 cans of beer/week, mostly on weekends.  . Drug use: No  . Sexual activity: Not on file  Lifestyle  . Physical activity    Days per week: Not on file    Minutes per session: Not on file  . Stress: Not on file  Relationships  . Social Herbalist on phone: Not on file    Gets together: Not on file    Attends religious service: Not on file    Active member of club or organization: Not on file    Attends meetings of clubs or organizations: Not on file    Relationship status: Not on file  Other Topics Concern  . Not on file  Social History Narrative  . Not on file    Review of Systems: Gen: Denies fever, chills, anorexia. Denies fatigue, weakness, weight loss.  CV: Denies chest pain, palpitations, syncope, peripheral edema, and claudication. Resp: Denies dyspnea at rest, cough, wheezing, coughing up blood, and pleurisy. GI: see HPI Derm: Denies rash, itching, dry skin Psych: Denies depression, anxiety, memory loss, confusion. No homicidal or suicidal ideation.  Heme: Denies bruising, bleeding, and enlarged lymph nodes.  Physical Exam: BP 138/86   Pulse 67   Temp (!) 96.8 F (36 C) (Temporal)   Ht 5\' 11"  (1.803 m)   Wt 196 lb (88.9 kg)   BMI 27.34 kg/m  General:   Alert and oriented. No distress noted. Pleasant and cooperative.  Head:   Normocephalic and atraumatic. Eyes:  Conjuctiva clear without scleral icterus. Mouth:  Oral mucosa pink and moist.  Abdomen:  +BS, soft, non-tender and non-distended. No rebound or guarding. No HSM or masses noted. Msk:  Symmetrical without gross deformities. Normal posture. Extremities:  Without edema. Neurologic:  Alert and  oriented x4 Psych:  Alert and cooperative.  Normal mood and affect.  Lab Results  Component Value Date   LIPASE 26 10/30/2018   Lab Results  Component Value Date   ALT 81 (H) 10/30/2018   AST 180 (H) 10/30/2018   ALKPHOS 78 10/30/2018   BILITOT 0.9 10/30/2018   Lab Results  Component Value Date   CREATININE 1.21 10/30/2018   BUN 15 10/30/2018   NA 137 10/30/2018   K 5.1 10/30/2018   CL 103 10/30/2018   CO2 21 (L) 10/30/2018

## 2018-11-10 NOTE — Assessment & Plan Note (Signed)
History of adenomas with 3-year-surveillance due.  Proceed with colonoscopy with Dr. Oneida Alar in the near future. The risks, benefits, and alternatives have been discussed in detail with the patient. They state understanding and desire to proceed.  Propofol due to ETOH use

## 2018-11-10 NOTE — Assessment & Plan Note (Addendum)
53 year old male with recurrent RUQ pain, acute onset, with associated elevated transaminases (AST > ALT), normal lipase. No gallstones on ultrasound. Known fatty liver. CT normal. In setting of Viberzi 100 mg BID AND alcohol use daily, concern for adverse side effect of Viberzi. Discussed for his safety, would be best to stop Viberzi now. I also discussed avoidance of alcohol going forward. We will need to repeat HFP in 4-6 weeks. Currently, RUQ pain has resolved and taking Prilosec daily. Long-standing history of GERD and no prior EGD. Patient raised idea of endoscopic evaluation. Although RUQ pain not related to gastritis/duodenitis, or PUD, he does have history of chronic GERD and several risk factors for Barrett's. He would like to pursue EGD at time of surveillance colonoscopy.   Proceed with upper endoscopy in the near future with Dr. Oneida Alar. The risks, benefits, and alternatives have been discussed in detail with patient. They have stated understanding and desire to proceed.  Propofol due to chronic ETOH use Stop Viberzi For IBS-D: trial of Xifaxan X 2 weeks sent to pharmacy

## 2018-11-19 ENCOUNTER — Telehealth: Payer: Self-pay

## 2018-11-19 NOTE — Telephone Encounter (Signed)
Tried to call pt to schedule TCS/EGD w/Propofol w/SLF, no answer, LMOVM for return call.

## 2018-11-26 ENCOUNTER — Other Ambulatory Visit: Payer: Self-pay

## 2018-11-26 DIAGNOSIS — Z86010 Personal history of colonic polyps: Secondary | ICD-10-CM

## 2018-11-26 DIAGNOSIS — R1011 Right upper quadrant pain: Secondary | ICD-10-CM

## 2018-11-26 MED ORDER — SUPREP BOWEL PREP KIT 17.5-3.13-1.6 GM/177ML PO SOLN: 1.0000 | ORAL | 0 refills | Status: DC

## 2018-11-26 NOTE — Telephone Encounter (Signed)
Called pt, TCS/EGD w/Propofol w/SLF scheduled for 02/15/19 at 10:45am. Rx for prep sent to pharmacy. Orders entered.

## 2018-11-26 NOTE — Telephone Encounter (Signed)
Pre-op appt 02/10/19 at 10:00am, COVID test appt 02/12/19 at 9:25am. Appt letters mailed with procedure instructions.

## 2018-11-30 NOTE — Patient Instructions (Signed)
PA for TCS/EGD submitted via [facility] website. Case approved. PA# [id number], valid 02/15/19-05/16/19.

## 2018-12-08 ENCOUNTER — Ambulatory Visit (INDEPENDENT_AMBULATORY_CARE_PROVIDER_SITE_OTHER): Payer: 59 | Admitting: Family Medicine

## 2018-12-08 DIAGNOSIS — R1012 Left upper quadrant pain: Secondary | ICD-10-CM

## 2018-12-08 MED ORDER — AMOXICILLIN-POT CLAVULANATE 875-125 MG PO TABS: 1.0000 | ORAL_TABLET | Freq: Two times a day (BID) | ORAL | 0 refills | Status: DC

## 2018-12-08 NOTE — Progress Notes (Signed)
Telephone visit  Subjective: [id number] pain PCP: [name], DO YHC:WCBJ H Kinn is a 53 y.o. male calls for telephone consult today. Patient provides verbal consent for consult held via phone.  Location of patient: home lying in bed Location of provider: Working remotely from home Others present for call: none  1. Abdominal pain Patient reports sharp pain in his left side under his ribs anteriorly for the last few days.  He has been doing a lot of work in the basement moving things.  Pain is mild currently but is worse with movement.  Patient denies diarrhea, nausea, vomiting, fevers, hematochezia.  He reports bowel movements are normal.  Intake is normal.  He took Motrin and Tylenol and it helped.  He goes on to state that he had diverticulitis about 15 years ago in the same region and it feels very similar.   ROS: Per HPI  No Known Allergies Past Medical History:  Diagnosis Date  . Anxiety   . Arthritis   . Depression   . Fatty liver   . GERD (gastroesophageal reflux disease)   . Hyperlipidemia   . Hypertension   . IBS (irritable bowel syndrome)    Diarrhea type    Current Outpatient Medications:  .  amLODipine (NORVASC) 5 MG tablet, Take 1 tablet (5 mg total) by mouth daily., Disp: 90 tablet, Rfl: 1 .  Ascorbic Acid (VITAMIN C) 1000 MG tablet, Take 1,000 mg by mouth daily., Disp: , Rfl:  .  aspirin EC 81 MG tablet, Take 81 mg by mouth daily., Disp: , Rfl:  .  cholecalciferol (VITAMIN D3) 25 MCG (1000 UT) tablet, Take 1,000 Units by mouth daily., Disp: , Rfl:  .  escitalopram (LEXAPRO) 10 MG tablet, Take 1 tablet (10 mg total) by mouth daily., Disp: 90 tablet, Rfl: 3 .  Na Sulfate-K Sulfate-Mg Sulf (SUPREP BOWEL PREP KIT) 17.5-3.13-1.6 GM/177ML SOLN, Take 1 kit by mouth as directed., Disp: 354 mL, Rfl: 0 .  omeprazole (PRILOSEC) 40 MG capsule, Take 1 capsule (40 mg total) by mouth daily., Disp: 90 capsule, Rfl: 3 .  rosuvastatin (CRESTOR) 40 MG tablet, Take 1  tablet (40 mg total) by mouth daily., Disp: 90 tablet, Rfl: 3 .  sucralfate (CARAFATE) 1 GM/10ML suspension, Take 10 mLs (1 g total) by mouth 4 (four) times daily -  with meals and at bedtime., Disp: 420 mL, Rfl: 0 .  VIBERZI 100 MG TABS, TAKE 1 TABLET BY MOUTH TWICE DAILY WITH FOOD (Patient taking differently: Take 100 mg by mouth 2 (two) times daily. ), Disp: 60 tablet, Rfl: 5 .  vitamin B-12 (CYANOCOBALAMIN) 1000 MCG tablet, Take 1,000 mcg by mouth daily., Disp: , Rfl:  .  vitamin E 400 UNIT capsule, Take 400 Units by mouth daily., Disp: , Rfl:   Assessment/ Plan: 53 y.o. male   1. Acute LUQ pain I reviewed his last colonoscopy and last CT abdomen neither of which demonstrated any diverticula in that region.  I have a high suspicion that this is likely musculoskeletal in nature, particularly given his response to oral analgesics.  However, in the absence of physical exam and given that his report of diverticulitis was in the same region previously will empirically treat with at least Augmentin to cover for possible infection.  We discussed red flag signs and symptoms warranting further evaluation.  If no significant improvement within the next couple of days I would like to see him in office.  He voiced good understanding of the plan  and follow-up PRN. - amoxicillin-clavulanate (AUGMENTIN) 875-125 MG tablet; Take 1 tablet by mouth 2 (two) times daily.  Dispense: 20 tablet; Refill: 0   Start time: 8:15am End time: 8:21am  Total time spent on patient care (including telephone call/ virtual visit): 15 minutes  [place], [place] [phone]

## 2018-12-18 ENCOUNTER — Telehealth: Payer: Self-pay | Admitting: Gastroenterology

## 2018-12-18 NOTE — Telephone Encounter (Signed)
Seth Mcmillan: can we remind patient to complete HFP?

## 2018-12-21 ENCOUNTER — Telehealth: Payer: Self-pay

## 2018-12-21 NOTE — Telephone Encounter (Signed)
Letter with lab order was mailed to pt on 12/08/2018 with lab order to do around 12/17/2018. I have called and left Vm as a reminder to please go to lab and get this done right aware.

## 2018-12-21 NOTE — Telephone Encounter (Signed)
Pt said he has not received his lab order in the mail. He requested to have the order faxed to [facility] and I did so. He confirmed his appointment for tomorrow also.

## 2018-12-22 ENCOUNTER — Other Ambulatory Visit: Payer: Self-pay

## 2018-12-22 ENCOUNTER — Ambulatory Visit: Payer: 59 | Admitting: Nurse Practitioner

## 2018-12-22 ENCOUNTER — Encounter: Payer: Self-pay | Admitting: Nurse Practitioner

## 2018-12-22 ENCOUNTER — Telehealth: Payer: Self-pay

## 2018-12-22 VITALS — BP 152/102 | HR 80 | Temp 96.9°F | Ht 71.0 in | Wt 200.4 lb

## 2018-12-22 DIAGNOSIS — R7989 Other specified abnormal findings of blood chemistry: Secondary | ICD-10-CM | POA: Diagnosis not present

## 2018-12-22 DIAGNOSIS — R1011 Right upper quadrant pain: Secondary | ICD-10-CM

## 2018-12-22 DIAGNOSIS — K219 Gastro-esophageal reflux disease without esophagitis: Secondary | ICD-10-CM

## 2018-12-22 NOTE — Progress Notes (Signed)
Referring Provider: [name], DO Primary Care Physician:  [name], DO Primary GI:  Dr. Oneida Alar  Chief Complaint  Patient presents with   Abdominal Pain    no pain at this time; proc scheduled for 01/2019    HPI:   Seth Mcmillan is a 53 y.o. male who presents for follow-up on abdominal pain. The patient was last seen in our office 11/05/2018 for RUQ pain and history of colon polyps. History of fatty liver disease and elevated transaminases. Due for surveillance colonoscopy. RUQ [place] with fatty liver, mildly distended gallbladder without cholecystitis. CT abd/pelvis with contrast was normal. Hep B and C negative. Increasing transaminitis with AST/ALT 180/81.   At his last office visit noted abdominal pain but believes in the medical quadrant) with a work feeding himself and mother, thinks he may have had a pulmonary cancer.  Urgent care ninth September EGD.  On Carafate has improved weakness and headaches.  With a goal of doing some pain while working on your detailed loss.  Prilosec daily as needed which manages his GERD well.  No dysphagia, no further abdominal pain.  Full time and self Viberzi 100 mg twice daily with bowel movements twice a day history.  Full few beers every night, possibly more on the weekends.  Recommend stop Viberzi for now due to abdominal pain, avoid alcohol, try a course of Xifaxan for IBS diarrhea type, follow-up with progress report.  Colonoscopy and upper endoscopy.  Follow-up in 2 months.  Previously recommended HFP, which has not been completed.  EGD/TCS scheduled for 02/15/2019.  Today he states he's doing ok overall. His pain has resolved for now, last episode was 3 weeks ago. Still on prilosec, GERD manages his GERD well. Has been off Viberzi, Xifaxan seems to have worked well. Has been going 1-2 times a day, stools are consistent with [place] 4. Denies hematochezia, melena, fever, chills, unintentional weight loss. Denies URI or flu-like  symptoms. Denies loss of sense of taste or smell. Denies chest pain, dyspnea, dizziness, lightheadedness, syncope, near syncope. Denies any other upper or lower GI symptoms.  Past Medical History:  Diagnosis Date   Anxiety    Arthritis    Depression    Fatty liver    GERD (gastroesophageal reflux disease)    Hyperlipidemia    Hypertension    IBS (irritable bowel syndrome)    Diarrhea type    Past Surgical History:  Procedure Laterality Date   ANTERIOR CERVICAL DECOMP/DISCECTOMY FUSION N/A 05/18/2018   Procedure: C6-7. C7-T1 ANTERIOR CERVICAL DECOMPRESSION/DISCECTOMY FUSION, ALLOGRAFT, PLATE;  Surgeon: Marybelle Killings, MD;  Location: [place];  Service: Orthopedics;  Laterality: N/A;   BIOPSY  09/05/2015   Procedure: BIOPSY;  Surgeon: Danie Binder, MD;  [facility];  Service: Endoscopy;;  random colon bx   COLONOSCOPY N/A 09/05/2015   Dr. Oneida Alar: 4 simple adenomas removed.  Next colonoscopy 3 years.   KNEE SURGERY Right 1997   bursa sac   POLYPECTOMY  09/05/2015   Procedure: POLYPECTOMY;  Surgeon: Danie Binder, MD;  [facility];  Service: Endoscopy;;  colon polyps   SHOULDER ARTHROSCOPY WITH BICEPS TENDON REPAIR Right 2011   TONSILECTOMY, ADENOIDECTOMY, BILATERAL MYRINGOTOMY AND TUBES      Current Outpatient Medications  Medication Sig Dispense Refill   amLODipine (NORVASC) 5 MG tablet Take 1 tablet (5 mg total) by mouth daily. 90 tablet 1   Ascorbic Acid (VITAMIN C) 1000 MG tablet Take 1,000  mg by mouth daily.     aspirin EC 81 MG tablet Take 81 mg by mouth daily.     cholecalciferol (VITAMIN D3) 25 MCG (1000 UT) tablet Take 1,000 Units by mouth daily.     escitalopram (LEXAPRO) 10 MG tablet Take 1 tablet (10 mg total) by mouth daily. 90 tablet 3   omeprazole (PRILOSEC) 40 MG capsule Take 1 capsule (40 mg total) by mouth daily. 90 capsule 3   rosuvastatin (CRESTOR) 40 MG tablet Take 1 tablet (40 mg total) by mouth daily. 90 tablet 3    vitamin B-12 (CYANOCOBALAMIN) 1000 MCG tablet Take 1,000 mcg by mouth daily.     vitamin E 400 UNIT capsule Take 400 Units by mouth daily.     Na Sulfate-K Sulfate-Mg Sulf (SUPREP BOWEL PREP KIT) 17.5-3.13-1.6 GM/177ML SOLN Take 1 kit by mouth as directed. (Patient not taking: Reported on 12/22/2018) 354 mL 0   No current facility-administered medications for this visit.     Allergies as of 12/22/2018   (No Known Allergies)    Family History  Problem Relation Age of Onset   Heart disease Mother    Heart disease Father    Diabetes Brother    Colon cancer Neg Hx     Social History   Socioeconomic History   Marital status: Divorced    Spouse name: Not on file   Number of children: Not on file   Years of education: Not on file   Highest education level: Not on file  Occupational History   Not on file  Social Needs   Financial resource strain: Not on file   Food insecurity    Worry: Not on file    Inability: Not on file   Transportation needs    Medical: Not on file    Non-medical: Not on file  Tobacco Use   Smoking status: Former Smoker    Packs/day: 1.00    Years: 25.00    Pack years: 25.00    Types: Cigarettes    Quit date: 07/16/2017    Years since quitting: 1.4   Smokeless tobacco: Never Used  Substance and Sexual Activity   Alcohol use: Yes    Alcohol/week: 12.0 standard drinks    Types: 12 Cans of beer per week    Comment: 12 cans of beer/week, mostly on weekends.   Drug use: No   Sexual activity: Not on file  Lifestyle   Physical activity    Days per week: Not on file    Minutes per session: Not on file   Stress: Not on file  Relationships   Social connections    Talks on phone: Not on file    Gets together: Not on file    Attends religious service: Not on file    Active member of club or organization: Not on file    Attends meetings of clubs or organizations: Not on file    Relationship status: Not on file  Other Topics Concern    Not on file  Social History Narrative   Not on file    Review of Systems: General: Negative for anorexia, weight loss, fever, chills, fatigue, weakness. ENT: Negative for hoarseness, difficulty swallowing. CV: Negative for chest pain, angina, palpitations, peripheral edema.  Respiratory: Negative for dyspnea at rest, cough, sputum, wheezing.  GI: See history of present illness. Endo: Negative for unusual weight change.  Heme: Negative for bruising or bleeding. Allergy: Negative for rash or hives.   Physical Exam: BP Marland Kitchen)  152/102    Pulse 80    Temp (!) 96.9 F (36.1 C) (Temporal)    Ht '5\' 11"'$  (1.803 m)    Wt 200 lb 6.4 oz (90.9 kg)    BMI 27.95 kg/m  General:   Alert and oriented. Pleasant and cooperative. Well-nourished and well-developed.  Eyes:  Without icterus, sclera clear and conjunctiva pink.  Ears:  Normal auditory acuity. Cardiovascular:  S1, S2 present without murmurs appreciated. Extremities without clubbing or edema. Respiratory:  Clear to auscultation bilaterally. No wheezes, rales, or rhonchi. No distress.  Gastrointestinal:  +BS, soft, non-tender and non-distended. No HSM noted. No guarding or rebound. No masses appreciated.  Rectal:  Deferred  Musculoskalatal:  Symmetrical without gross deformities. Neurologic:  Alert and oriented x4;  grossly normal neurologically. Psych:  Alert and cooperative. Normal mood and affect. Heme/Lymph/Immune: No excessive bruising noted.    12/22/2018 2:51 PM   Disclaimer: This note was dictated with voice recognition software. Similar sounding words can inadvertently be transcribed and may not be corrected upon review.

## 2018-12-22 NOTE — Patient Instructions (Signed)
Your health issues we discussed today were:   Abdominal pain: 1. Let us know if your abdominal pain returns 2. Have your labs drawn when you are able to do 3. We will provide you with a work note 4. Call us if you have any worsening or severe symptoms  Elevated liver enzymes: 1. Have your labs drawn today 2. We will call you with results  GERD (reflux/heartburn): 1. Continue to take your acid blocker medication 2. Let us know if your symptoms become worse or severe  Overall I recommend:  1. Continue your other current medications 2. Return for follow-up in 3 months 3. Call us if you have any questions or concerns   Because of recent events of COVID-19 ("Coronavirus"), follow CDC recommendations:  Wash your hand frequently Avoid touching your face Stay away from people who are sick If you have symptoms such as fever, cough, shortness of breath then call your healthcare provider for further guidance If you are sick, STAY AT HOME unless otherwise directed by your healthcare provider. Follow directions from state and national officials regarding staying safe   At [facility] we value your feedback. You may receive a survey about your visit today. Please share your experience as we strive to create trusting relationships with our patients to provide genuine, compassionate, quality care.  We appreciate your understanding and patience as we review any laboratory studies, imaging, and other diagnostic tests that are ordered as we care for you. Our office policy is 5 business days for review of these results, and any emergent or urgent results are addressed in a timely manner for your best interest. If you do not hear from our office in 1 week, please contact us.   We also encourage the use of MyChart, which contains your medical information for your review as well. If you are not enrolled in this feature, an access code is on this after visit summary for your convenience.  Thank you for allowing [place] to be involved in your care.  It was great to see you today!  I hope you have a great Fall!!

## 2018-12-22 NOTE — Assessment & Plan Note (Signed)
Abdominal pain is essentially resolved for now.  No abdominal pain in 3 weeks.  Recommend he proceed with his previously scheduled endoscopy and colonoscopy.  Notify us of any recurrent pain.  We will provide him a work note today, repeat his labs.  Follow-up in 3 months.

## 2018-12-22 NOTE — Assessment & Plan Note (Signed)
GERD symptoms doing well on PPI.  Recommend he continue his current medication and follow-up in 3 months.

## 2018-12-22 NOTE — Assessment & Plan Note (Signed)
Previous elevated LFTs with known history of fatty liver disease.  Recommended repeat HFP has not been completed.  I will reprint the lab slips and have him go to the lab today.  Keep procedure as scheduled, follow-up in 3 months.

## 2018-12-23 LAB — HEPATIC FUNCTION PANEL
AG Ratio: 1.9 (calc) (ref 1.0–2.5)
ALT: 24 U/L (ref 9–46)
AST: 24 U/L (ref 10–35)
Albumin: 4.3 g/dL (ref 3.6–5.1)
Alkaline phosphatase (APISO): 63 U/L (ref 35–144)
Bilirubin, Direct: 0.1 mg/dL (ref 0.0–0.2)
Globulin: 2.3 g/dL (calc) (ref 1.9–3.7)
Indirect Bilirubin: 0.7 mg/dL (calc) (ref 0.2–1.2)
Total Bilirubin: 0.8 mg/dL (ref 0.2–1.2)
Total Protein: 6.6 g/dL (ref 6.1–8.1)

## 2018-12-30 NOTE — Telephone Encounter (Signed)
Opened in error

## 2019-02-04 NOTE — Patient Instructions (Signed)
Seth Mcmillan  02/04/2019     @PREFPERIOPPHARMACY @   Your procedure is scheduled on  02/15/2019  Report to [facility] at  [place].  Call this number if you have problems the morning of surgery:  [phone]   Remember:  Follow the diet and prep instructions given to you by Dr Oneida Alar office.                     Take these medicines the morning of surgery with A SIP OF WATER  Amlodipine, lexapro, prilosec.    Do not wear jewelry, make-up or nail polish.  Do not wear lotions, powders, or perfumes. Please wear deodorant and brush your teeth.  Do not shave 48 hours prior to surgery.  Men may shave face and neck.  Do not bring valuables to the hospital.  [facility] is not responsible for any belongings or valuables.  Contacts, dentures or bridgework may not be worn into surgery.  Leave your suitcase in the car.  After surgery it may be brought to your room.  For patients admitted to the hospital, discharge time will be determined by your treatment team.  Patients discharged the day of surgery will not be allowed to drive home.   Name and phone number of your driver:   family Special instructions:  None  Please read over the following fact sheets that you were given. Anesthesia Post-op Instructions and Care and Recovery After Surgery       Upper Endoscopy, Adult, Care After This sheet gives you information about how to care for yourself after your procedure. Your health care provider may also give you more specific instructions. If you have problems or questions, contact your health care provider. What can I expect after the procedure? After the procedure, it is common to have:  A sore throat.  Mild stomach pain or discomfort.  Bloating.  Nausea. Follow these instructions at home:   Follow instructions from your health care provider about what to eat or drink after your procedure.  Return to your normal activities as told by your health care provider.  Ask your health care provider what activities are safe for you.  Take over-the-counter and prescription medicines only as told by your health care provider.  Do not drive for 24 hours if you were given a sedative during your procedure.  Keep all follow-up visits as told by your health care provider. This is important. Contact a health care provider if you have:  A sore throat that lasts longer than one day.  Trouble swallowing. Get help right away if:  You vomit blood or your vomit looks like coffee grounds.  You have: ? A fever. ? Bloody, black, or tarry stools. ? A severe sore throat or you cannot swallow. ? Difficulty breathing. ? Severe pain in your chest or abdomen. Summary  After the procedure, it is common to have a sore throat, mild stomach discomfort, bloating, and nausea.  Do not drive for 24 hours if you were given a sedative during the procedure.  Follow instructions from your health care provider about what to eat or drink after your procedure.  Return to your normal activities as told by your health care provider. This information is not intended to replace advice given to you by your health care provider. Make sure you discuss any questions you have with your health care provider. Document Released: 09/03/2011 Document Revised: 08/26/2017 Document Reviewed:  08/04/2017 Elsevier Patient Education  [employer].  Colonoscopy, Adult, Care After This sheet gives you information about how to care for yourself after your procedure. Your health care provider may also give you more specific instructions. If you have problems or questions, contact your health care provider. What can I expect after the procedure? After the procedure, it is common to have:  A small amount of blood in your stool for 24 hours after the procedure.  Some gas.  Mild abdominal cramping or bloating. Follow these instructions at home: General instructions  For the first 24 hours after  the procedure: ? Do not drive or use machinery. ? Do not sign important documents. ? Do not drink alcohol. ? Do your regular daily activities at a slower pace than normal. ? Eat soft, easy-to-digest foods.  Take over-the-counter or prescription medicines only as told by your health care provider. Relieving cramping and bloating   Try walking around when you have cramps or feel bloated.  Apply heat to your abdomen as told by your health care provider. Use a heat source that your health care provider recommends, such as a moist heat pack or a heating pad. ? Place a towel between your skin and the heat source. ? Leave the heat on for 20-30 minutes. ? Remove the heat if your skin turns bright red. This is especially important if you are unable to feel pain, heat, or cold. You may have a greater risk of getting burned. Eating and drinking   Drink enough fluid to keep your urine pale yellow.  Resume your normal diet as instructed by your health care provider. Avoid heavy or fried foods that are hard to digest.  Avoid drinking alcohol for as long as instructed by your health care provider. Contact a health care provider if:  You have blood in your stool 2-3 days after the procedure. Get help right away if:  You have more than a small spotting of blood in your stool.  You pass large blood clots in your stool.  Your abdomen is swollen.  You have nausea or vomiting.  You have a fever.  You have increasing abdominal pain that is not relieved with medicine. Summary  After the procedure, it is common to have a small amount of blood in your stool. You may also have mild abdominal cramping and bloating.  For the first 24 hours after the procedure, do not drive or use machinery, sign important documents, or drink alcohol.  Contact your health care provider if you have a lot of blood in your stool, nausea or vomiting, a fever, or increased abdominal pain. This information is not  intended to replace advice given to you by your health care provider. Make sure you discuss any questions you have with your health care provider. Document Released: 10/17/2003 Document Revised: 12/25/2016 Document Reviewed: 05/16/2015 Elsevier Patient Education  2020 [place] After These instructions provide you with information about caring for yourself after your procedure. Your health care provider may also give you more specific instructions. Your treatment has been planned according to current medical practices, but problems sometimes occur. Call your health care provider if you have any problems or questions after your procedure. What can I expect after the procedure? After your procedure, you may:  Feel sleepy for several hours.  Feel clumsy and have poor balance for several hours.  Feel forgetful about what happened after the procedure.  Have poor judgment for several hours.  Feel nauseous or vomit.  Have a sore throat if you had a breathing tube during the procedure. Follow these instructions at home: For at least 24 hours after the procedure:      Have a responsible adult stay with you. It is important to have someone help care for you until you are awake and alert.  Rest as needed.  Do not: ? Participate in activities in which you could fall or become injured. ? Drive. ? Use heavy machinery. ? Drink alcohol. ? Take sleeping pills or medicines that cause drowsiness. ? Make important decisions or sign legal documents. ? Take care of children on your own. Eating and drinking  Follow the diet that is recommended by your health care provider.  If you vomit, drink water, juice, or soup when you can drink without vomiting.  Make sure you have little or no nausea before eating solid foods. General instructions  Take over-the-counter and prescription medicines only as told by your health care provider.  If you have sleep apnea,  surgery and certain medicines can increase your risk for breathing problems. Follow instructions from your health care provider about wearing your sleep device: ? Anytime you are sleeping, including during daytime naps. ? While taking prescription pain medicines, sleeping medicines, or medicines that make you drowsy.  If you smoke, do not smoke without supervision.  Keep all follow-up visits as told by your health care provider. This is important. Contact a health care provider if:  You keep feeling nauseous or you keep vomiting.  You feel light-headed.  You develop a rash.  You have a fever. Get help right away if:  You have trouble breathing. Summary  For several hours after your procedure, you may feel sleepy and have poor judgment.  Have a responsible adult stay with you for at least 24 hours or until you are awake and alert. This information is not intended to replace advice given to you by your health care provider. Make sure you discuss any questions you have with your health care provider. Document Released: 06/25/2015 Document Revised: 06/02/2017 Document Reviewed: 06/25/2015 Elsevier Patient Education  2020 [employer].

## 2019-02-08 ENCOUNTER — Telehealth: Payer: Self-pay

## 2019-02-08 NOTE — Telephone Encounter (Signed)
Pt called office and LMOVM, requested to reschedule TCS/EGD w/Propofol w/[facility] that was scheduled for 02/15/19.  Tried to call pt back, no answer, LMOVM and informed him our office will call him when [facility] March schedule is available to reschedule procedure. Endo scheduler informed.

## 2019-02-09 ENCOUNTER — Telehealth: Payer: Self-pay | Admitting: Gastroenterology

## 2019-02-09 NOTE — Telephone Encounter (Signed)
Pt called to say he needed to cancel his procedure with [facility] and wanted to reschedule. [phone]

## 2019-02-09 NOTE — Telephone Encounter (Signed)
Called pt, informed him procedure has already been cancelled. Will contact him when [facility]'s March schedule is available.

## 2019-02-10 ENCOUNTER — Encounter (HOSPITAL_COMMUNITY): Payer: Self-pay

## 2019-02-10 ENCOUNTER — Encounter (HOSPITAL_COMMUNITY)
Admission: RE | Admit: 2019-02-10 | Discharge: 2019-02-10 | Disposition: A | Discharge status: Home / Self Care | Payer: 59 | Source: Ambulatory Visit | Attending: Gastroenterology | Admitting: Gastroenterology

## 2019-02-12 ENCOUNTER — Other Ambulatory Visit (HOSPITAL_COMMUNITY): Payer: 59

## 2019-02-15 ENCOUNTER — Encounter (HOSPITAL_COMMUNITY): Payer: Self-pay

## 2019-02-15 ENCOUNTER — Ambulatory Visit (HOSPITAL_COMMUNITY): Admit: 2019-02-15 | Payer: 59 | Admitting: Gastroenterology

## 2019-02-15 SURGERY — COLONOSCOPY WITH PROPOFOL
Anesthesia: Monitor Anesthesia Care

## 2019-02-15 NOTE — Telephone Encounter (Signed)
LMOVM to r/s procedures

## 2019-02-16 ENCOUNTER — Other Ambulatory Visit: Payer: Self-pay | Admitting: *Deleted

## 2019-02-16 DIAGNOSIS — Z86010 Personal history of colonic polyps: Secondary | ICD-10-CM

## 2019-02-16 DIAGNOSIS — R1011 Right upper quadrant pain: Secondary | ICD-10-CM

## 2019-02-16 MED ORDER — SUPREP BOWEL PREP KIT 17.5-3.13-1.6 GM/177ML PO SOLN: 1.0000 | ORAL | 0 refills | Status: DC

## 2019-02-16 NOTE — Telephone Encounter (Signed)
Spoke with patient. He is scheduled for procedures 3/9 at 12:15pm. Patient aware will mail new instructions with his pre-op/vcovid test. Confirmed address. He never picked up Rx. rx SENT. New orders placed.

## 2019-02-16 NOTE — Telephone Encounter (Signed)
LMOVM for pt. Letter mailed. 

## 2019-02-16 NOTE — Telephone Encounter (Signed)
Pt received message to schedule his procedure.

## 2019-02-17 ENCOUNTER — Encounter: Payer: Self-pay | Admitting: *Deleted

## 2019-03-22 ENCOUNTER — Telehealth: Payer: Self-pay | Admitting: *Deleted

## 2019-03-22 NOTE — Addendum Note (Signed)
Addended by: Cheron Every on: 03/22/2019 08:39 AM   Modules accepted: Orders

## 2019-03-22 NOTE — Telephone Encounter (Signed)
PA approved via [facility] website for TCS/EGD. Auth# [id number] dates 05/25/2019-08/23/2019

## 2019-03-24 ENCOUNTER — Ambulatory Visit (INDEPENDENT_AMBULATORY_CARE_PROVIDER_SITE_OTHER): Payer: 59 | Admitting: Nurse Practitioner

## 2019-03-24 ENCOUNTER — Encounter: Payer: Self-pay | Admitting: Nurse Practitioner

## 2019-03-24 ENCOUNTER — Other Ambulatory Visit: Payer: Self-pay

## 2019-03-24 VITALS — BP 155/94 | HR 76 | Ht 71.0 in | Wt 201.8 lb

## 2019-03-24 DIAGNOSIS — K219 Gastro-esophageal reflux disease without esophagitis: Secondary | ICD-10-CM

## 2019-03-24 DIAGNOSIS — K580 Irritable bowel syndrome with diarrhea: Secondary | ICD-10-CM | POA: Diagnosis not present

## 2019-03-24 DIAGNOSIS — R7989 Other specified abnormal findings of blood chemistry: Secondary | ICD-10-CM

## 2019-03-24 NOTE — Patient Instructions (Addendum)
Your health issues we discussed today were:   GERD (reflux/heartburn): 1. Continue taking your current medication as it seems to be working well for you 2. Call us if you have any worsening or severe symptoms  IBS diarrhea type: 1. Let us know if you have any worsening diarrhea 2. Otherwise continue your current medications  Elevated liver enzymes: 1. As we discussed, your liver enzymes were normal in October 2. They will check your liver enzymes with your preop labs prior to your colonoscopy and endoscopy in March 3. As we discussed, try to limit your alcohol intake to no more than 1 or 2 drinks a day.  This will help prevent worsening fatty liver disease and scarring of your liver 4. See diet information and recommendations below related to fatty liver disease  Overall I recommend:  1. Continue your other current medications 2. Return for follow-up in 6 months 3. Call us if you have any questions or concerns   Because of recent events of COVID-19 ("Coronavirus"), follow CDC recommendations:  Wash your hand frequently Avoid touching your face Stay away from people who are sick If you have symptoms such as fever, cough, shortness of breath then call your healthcare provider for further guidance If you are sick, STAY AT HOME unless otherwise directed by your healthcare provider. Follow directions from state and national officials regarding staying safe   At [facility] we value your feedback. You may receive a survey about your visit today. Please share your experience as we strive to create trusting relationships with our patients to provide genuine, compassionate, quality care.  We appreciate your understanding and patience as we review any laboratory studies, imaging, and other diagnostic tests that are ordered as we care for you. Our office policy is 5 business days for review of these results, and any emergent or urgent results are addressed in a timely manner  for your best interest. If you do not hear from our office in 1 week, please contact us.   We also encourage the use of MyChart, which contains your medical information for your review as well. If you are not enrolled in this feature, an access code is on this after visit summary for your convenience. Thank you for allowing [place] to be involved in your care.  It was great to see you today!  I hope you have a Happy New Year!!      Fatty Liver Disease  Fatty liver disease occurs when too much fat has built up in your liver cells. Fatty liver disease is also called hepatic steatosis or steatohepatitis. The liver removes harmful substances from your bloodstream and produces fluids that your body needs. It also helps your body use and store energy from the food you eat. In many cases, fatty liver disease does not cause symptoms or problems. It is often diagnosed when tests are being done for other reasons. However, over time, fatty liver can cause inflammation that may lead to more serious liver problems, such as scarring of the liver (cirrhosis) and liver failure. Fatty liver is associated with insulin resistance, increased body fat, high blood pressure (hypertension), and high cholesterol. These are features of metabolic syndrome and increase your risk for stroke, diabetes, and heart disease. What are the causes? This condition may be caused by:  Drinking too much alcohol.  Poor nutrition.  Obesity.  Cushing's syndrome.  Diabetes.  High cholesterol.  Certain drugs.  Poisons.  Some viral infections.  Pregnancy. What increases the risk? You  are more likely to develop this condition if you:  Abuse alcohol.  Are overweight.  Have diabetes.  Have hepatitis.  Have a high triglyceride level.  Are pregnant. What are the signs or symptoms? Fatty liver disease often does not cause symptoms. If symptoms do develop, they can include:  Fatigue.  Weakness.  Weight  loss.  Confusion.  Abdominal pain.  Nausea and vomiting.  Yellowing of your skin and the white parts of your eyes (jaundice).  Itchy skin. How is this diagnosed? This condition may be diagnosed by:  A physical exam and medical history.  Blood tests.  Imaging tests, such as an ultrasound, CT scan, or MRI.  A liver biopsy. A small sample of liver tissue is removed using a needle. The sample is then looked at under a microscope. How is this treated? Fatty liver disease is often caused by other health conditions. Treatment for fatty liver may involve medicines and lifestyle changes to manage conditions such as:  Alcoholism.  High cholesterol.  Diabetes.  Being overweight or obese. Follow these instructions at home:   Do not drink alcohol. If you have trouble quitting, ask your health care provider how to safely quit with the help of medicine or a supervised program. This is important to keep your condition from getting worse.  Eat a healthy diet as told by your health care provider. Ask your health care provider about working with a diet and nutrition specialist (dietitian) to develop an eating plan.  Exercise regularly. This can help you lose weight and control your cholesterol and diabetes. Talk to your health care provider about an exercise plan and which activities are best for you.  Take over-the-counter and prescription medicines only as told by your health care provider.  Keep all follow-up visits as told by your health care provider. This is important. Contact a health care provider if: You have trouble controlling your:  Blood sugar. This is especially important if you have diabetes.  Cholesterol.  Drinking of alcohol. Get help right away if:  You have abdominal pain.  You have jaundice.  You have nausea and vomiting.  You vomit blood or material that looks like coffee grounds.  You have stools that are black, tar-like, or bloody. Summary  Fatty  liver disease develops when too much fat builds up in the cells of your liver.  Fatty liver disease often causes no symptoms or problems. However, over time, fatty liver can cause inflammation that may lead to more serious liver problems, such as scarring of the liver (cirrhosis).  You are more likely to develop this condition if you abuse alcohol, are pregnant, are overweight, have diabetes, have hepatitis, or have high triglyceride levels.  Contact your health care provider if you have trouble controlling your weight, blood sugar, cholesterol, or drinking of alcohol. This information is not intended to replace advice given to you by your health care provider. Make sure you discuss any questions you have with your health care provider. Document Revised: 02/14/2017 Document Reviewed: 12/11/2016 Elsevier Patient Education  2020 [employer].

## 2019-03-24 NOTE — Progress Notes (Addendum)
REVIEWED-NO ADDITIONAL RECOMMENDATIONS.  Referring Provider: [name], DO Primary Care Physician:  [name], DO Primary GI:  Dr. Oneida Alar  Chief Complaint  Patient presents with  . Gastroesophageal Reflux    HPI:   Seth Mcmillan is a 54 y.o. male who presents for follow-up on abdominal pain and GERD.  Patient was last seen in our office 12/22/2018 for right upper quadrant pain, GERD, elevated LFTs.  Noted history of fatty liver disease and transaminitis.  Right upper quadrant U/S most recently completed with fatty liver, mildly distended gallbladder without cholecystitis.  CT of the abdomen and pelvis was normal.  Hepatitis B and C negative.  Previously Prilosec daily as needed manages his GERD well.  Previously recommended stop Viberzi due to abdominal pain, avoid alcohol, trying Xifaxan course for IBS diarrhea type.  Previously recommended updated HFP had not been completed.  Previously scheduled EGD and colonoscopy for 02/15/2019 was canceled.  It has been rescheduled to 05/25/2019.  At his last visit he noted abdominal pain that resolved and most recent episode 3 weeks prior.  Still on Prilosec which manages GERD well.  Has been on Viberzi, Xifaxan seems to work well with 1-2 stools a day consistent with Bristol 4.  No other overt GI complaints.  Recommended update labs, continue PPI, follow-up in 3 months.   HFP completed 12/22/2018 found completely normal; specifically normalized transaminases.  Today he states he's doing ok overall. Still on Prilosec 40 mg daily. Does well if he takes his medication, rare to no breakthrough. Is having about 2 stools a day consistent with Bristol 5. He had a flare of loose stools around the holidays which he attributes to not eating right.  At this time no need for retreatment with Xifaxan. Denies abdominal pain, N/V, hematochezia, melena, fever, chills, unintentional weight loss. Denies URI or flu-like symptoms. Denies loss of sense of  taste or smell. Denies chest pain, dyspnea, dizziness, lightheadedness, syncope, near syncope. Denies any other upper or lower GI symptoms.  Past Medical History:  Diagnosis Date  . Anxiety   . Arthritis   . Depression   . Fatty liver   . GERD (gastroesophageal reflux disease)   . Hyperlipidemia   . Hypertension   . IBS (irritable bowel syndrome)    Diarrhea type    Past Surgical History:  Procedure Laterality Date  . ANTERIOR CERVICAL DECOMP/DISCECTOMY FUSION N/A 05/18/2018   Procedure: C6-7. C7-T1 ANTERIOR CERVICAL DECOMPRESSION/DISCECTOMY FUSION, ALLOGRAFT, PLATE;  Surgeon: Marybelle Killings, MD;  Location: [facility];  Service: Orthopedics;  Laterality: N/A;  . BIOPSY  09/05/2015   Procedure: BIOPSY;  Surgeon: Danie Binder, MD;  [facility];  Service: Endoscopy;;  random colon bx  . COLONOSCOPY N/A 09/05/2015   Dr. Oneida Alar: 4 simple adenomas removed.  Next colonoscopy 3 years.  Marland Kitchen KNEE SURGERY Right 1997   bursa sac  . POLYPECTOMY  09/05/2015   Procedure: POLYPECTOMY;  Surgeon: Danie Binder, MD;  Location: [facility];  Service: Endoscopy;;  colon polyps  . SHOULDER ARTHROSCOPY WITH BICEPS TENDON REPAIR Right 2011  . TONSILECTOMY, ADENOIDECTOMY, BILATERAL MYRINGOTOMY AND TUBES      Current Outpatient Medications  Medication Sig Dispense Refill  . amLODipine (NORVASC) 5 MG tablet Take 1 tablet (5 mg total) by mouth daily. 90 tablet 1  . Ascorbic Acid (VITAMIN C) 1000 MG tablet Take 1,000 mg by mouth daily.    Marland Kitchen aspirin EC 81 MG tablet Take 81 mg by  mouth daily.    . cholecalciferol (VITAMIN D3) 25 MCG (1000 UT) tablet Take 1,000 Units by mouth daily.    Marland Kitchen escitalopram (LEXAPRO) 10 MG tablet Take 1 tablet (10 mg total) by mouth daily. 90 tablet 3  . omeprazole (PRILOSEC) 40 MG capsule Take 1 capsule (40 mg total) by mouth daily. 90 capsule 3  . vitamin B-12 (CYANOCOBALAMIN) 1000 MCG tablet Take 1,000 mcg by mouth daily.    . vitamin E 400 UNIT capsule Take 400 Units by  mouth daily.    . Na Sulfate-K Sulfate-Mg Sulf (SUPREP BOWEL PREP KIT) 17.5-3.13-1.6 GM/177ML SOLN Take 1 kit by mouth as directed. (Patient not taking: Reported on 03/24/2019) 354 mL 0  . rosuvastatin (CRESTOR) 40 MG tablet Take 1 tablet (40 mg total) by mouth daily. (Patient not taking: Reported on 03/24/2019) 90 tablet 3   No current facility-administered medications for this visit.    Allergies as of 03/24/2019  . (No Known Allergies)    Family History  Problem Relation Age of Onset  . Heart disease Mother   . Heart disease Father   . Diabetes Brother   . Colon cancer Neg Hx     Social History   Socioeconomic History  . Marital status: Divorced    Spouse name: Not on file  . Number of children: Not on file  . Years of education: Not on file  . Highest education level: Not on file  Occupational History  . Not on file  Tobacco Use  . Smoking status: Former Smoker    Packs/day: 1.00    Years: 25.00    Pack years: 25.00    Types: Cigarettes    Quit date: 07/16/2017    Years since quitting: 1.6  . Smokeless tobacco: Never Used  Substance and Sexual Activity  . Alcohol use: Yes    Alcohol/week: 12.0 standard drinks    Types: 12 Cans of beer per week    Comment: 12 cans of beer/week, mostly on weekends.  . Drug use: No  . Sexual activity: Not on file  Other Topics Concern  . Not on file  Social History Narrative  . Not on file   Social Determinants of Health   Financial Resource Strain:   . Difficulty of Paying Living Expenses: Not on file  Food Insecurity:   . Worried About Charity fundraiser in the Last Year: Not on file  . Ran Out of Food in the Last Year: Not on file  Transportation Needs:   . Lack of Transportation (Medical): Not on file  . Lack of Transportation (Non-Medical): Not on file  Physical Activity:   . Days of Exercise per Week: Not on file  . Minutes of Exercise per Session: Not on file  Stress:   . Feeling of Stress : Not on file  Social  Connections:   . Frequency of Communication with Friends and Family: Not on file  . Frequency of Social Gatherings with Friends and Family: Not on file  . Attends Religious Services: Not on file  . Active Member of Clubs or Organizations: Not on file  . Attends Archivist Meetings: Not on file  . Marital Status: Not on file    Review of Systems: General: Negative for anorexia, weight loss, fever, chills, fatigue, weakness. ENT: Negative for hoarseness, difficulty swallowing. CV: Negative for chest pain, angina, palpitations, peripheral edema.  Respiratory: Negative for dyspnea at rest, cough, sputum, wheezing.  GI: See history of present illness. Endo:  Negative for unusual weight change.  Heme: Negative for bruising or bleeding. Allergy: Negative for rash or hives.   Physical Exam: BP (!) 155/94   Pulse 76   Ht '5\' 11"'$  (1.803 m)   Wt 201 lb 12.8 oz (91.5 kg)   BMI 28.15 kg/m  General:   Alert and oriented. Pleasant and cooperative. Well-nourished and well-developed.  Eyes:  Without icterus, sclera clear and conjunctiva pink.  Ears:  Normal auditory acuity. Cardiovascular:  S1, S2 present without murmurs appreciated. Extremities without clubbing or edema. Respiratory:  Clear to auscultation bilaterally. No wheezes, rales, or rhonchi. No distress.  Gastrointestinal:  +BS, soft, non-tender and non-distended. No HSM noted. No guarding or rebound. No masses appreciated.  Rectal:  Deferred  Musculoskalatal:  Symmetrical without gross deformities. Neurologic:  Alert and oriented x4;  grossly normal neurologically. Psych:  Alert and cooperative. Normal mood and affect. Heme/Lymph/Immune: No excessive bruising noted.    03/24/2019 11:24 AM   Disclaimer: This note was dictated with voice recognition software. Similar sounding words can inadvertently be transcribed and may not be corrected upon review.

## 2019-03-24 NOTE — Progress Notes (Signed)
Cc'ed to pcp °

## 2019-04-22 ENCOUNTER — Other Ambulatory Visit: Payer: Self-pay | Admitting: Family Medicine

## 2019-04-22 DIAGNOSIS — K219 Gastro-esophageal reflux disease without esophagitis: Secondary | ICD-10-CM

## 2019-05-21 ENCOUNTER — Other Ambulatory Visit: Payer: Self-pay

## 2019-05-21 ENCOUNTER — Encounter (HOSPITAL_COMMUNITY): Payer: Self-pay

## 2019-05-24 ENCOUNTER — Other Ambulatory Visit (HOSPITAL_COMMUNITY)
Admission: RE | Admit: 2019-05-24 | Discharge: 2019-05-24 | Disposition: A | Discharge status: Home / Self Care | Payer: 59 | Source: Ambulatory Visit | Attending: Gastroenterology | Admitting: Gastroenterology

## 2019-05-24 ENCOUNTER — Telehealth: Payer: Self-pay | Admitting: Gastroenterology

## 2019-05-24 ENCOUNTER — Other Ambulatory Visit: Payer: Self-pay

## 2019-05-24 ENCOUNTER — Encounter (HOSPITAL_COMMUNITY)
Admission: RE | Admit: 2019-05-24 | Discharge: 2019-05-24 | Disposition: A | Discharge status: Home / Self Care | Payer: 59 | Source: Ambulatory Visit | Attending: Gastroenterology | Admitting: Gastroenterology

## 2019-05-24 DIAGNOSIS — Z20822 Contact with and (suspected) exposure to covid-19: Secondary | ICD-10-CM | POA: Diagnosis not present

## 2019-05-24 DIAGNOSIS — Z01812 Encounter for preprocedural laboratory examination: Secondary | ICD-10-CM | POA: Diagnosis not present

## 2019-05-24 MED ORDER — NA SULFATE-K SULFATE-MG SULF 17.5-3.13-1.6 GM/177ML PO SOLN: 1.0000 | Freq: Once | ORAL | 0 refills | Status: AC

## 2019-05-24 MED ORDER — CLENPIQ 10-3.5-12 MG-GM -GM/160ML PO SOLN: 1.0000 | Freq: Once | ORAL | 0 refills | Status: AC

## 2019-05-24 NOTE — Telephone Encounter (Signed)
Pt said his insurance doesn't cover the prep and needs something else. He is waiting in the pharmacy parking lot for a return call. [phone]

## 2019-05-24 NOTE — Telephone Encounter (Signed)
Rx for suprep resent to pharmacy. Called pt LMOVM

## 2019-05-24 NOTE — Addendum Note (Signed)
Addended by: Cheron Every on: 05/24/2019 11:24 AM   Modules accepted: Orders

## 2019-05-24 NOTE — Telephone Encounter (Signed)
Spoke with pt. Reports suprep is too expensive. Too late in day to start miralax prep as his procedure is tomorrow. Spoke with [facility] and was advised clenpiq would be at no charge for pt. Made aware will fax prep instructions'   Called pt back and made aware

## 2019-05-24 NOTE — Telephone Encounter (Signed)
PLEASE RESEND PATIENT PREP TO HIS PHARMACY

## 2019-05-25 ENCOUNTER — Ambulatory Visit (HOSPITAL_COMMUNITY): Payer: 59 | Admitting: Certified Registered Nurse Anesthetist

## 2019-05-25 ENCOUNTER — Ambulatory Visit (HOSPITAL_COMMUNITY)
Admission: RE | Admit: 2019-05-25 | Discharge: 2019-05-25 | Disposition: A | Discharge status: Home / Self Care | Payer: 59 | Source: Ambulatory Visit | Attending: Gastroenterology | Admitting: Gastroenterology

## 2019-05-25 ENCOUNTER — Encounter (HOSPITAL_COMMUNITY)
Admission: RE | Disposition: A | Discharge status: Home / Self Care | Payer: Self-pay | Source: Ambulatory Visit | Attending: Gastroenterology

## 2019-05-25 DIAGNOSIS — Z87891 Personal history of nicotine dependence: Secondary | ICD-10-CM | POA: Diagnosis not present

## 2019-05-25 DIAGNOSIS — M1990 Unspecified osteoarthritis, unspecified site: Secondary | ICD-10-CM | POA: Diagnosis not present

## 2019-05-25 DIAGNOSIS — Z860100 Personal history of colon polyps, unspecified: Secondary | ICD-10-CM

## 2019-05-25 DIAGNOSIS — I10 Essential (primary) hypertension: Secondary | ICD-10-CM | POA: Diagnosis not present

## 2019-05-25 DIAGNOSIS — F419 Anxiety disorder, unspecified: Secondary | ICD-10-CM | POA: Insufficient documentation

## 2019-05-25 DIAGNOSIS — K760 Fatty (change of) liver, not elsewhere classified: Secondary | ICD-10-CM | POA: Diagnosis not present

## 2019-05-25 DIAGNOSIS — K648 Other hemorrhoids: Secondary | ICD-10-CM | POA: Diagnosis not present

## 2019-05-25 DIAGNOSIS — R1011 Right upper quadrant pain: Secondary | ICD-10-CM

## 2019-05-25 DIAGNOSIS — K219 Gastro-esophageal reflux disease without esophagitis: Secondary | ICD-10-CM | POA: Insufficient documentation

## 2019-05-25 DIAGNOSIS — K2970 Gastritis, unspecified, without bleeding: Secondary | ICD-10-CM

## 2019-05-25 DIAGNOSIS — T39015A Adverse effect of aspirin, initial encounter: Secondary | ICD-10-CM | POA: Insufficient documentation

## 2019-05-25 DIAGNOSIS — Q438 Other specified congenital malformations of intestine: Secondary | ICD-10-CM | POA: Insufficient documentation

## 2019-05-25 DIAGNOSIS — K222 Esophageal obstruction: Secondary | ICD-10-CM

## 2019-05-25 DIAGNOSIS — Z79899 Other long term (current) drug therapy: Secondary | ICD-10-CM | POA: Diagnosis not present

## 2019-05-25 DIAGNOSIS — Z1211 Encounter for screening for malignant neoplasm of colon: Secondary | ICD-10-CM | POA: Insufficient documentation

## 2019-05-25 DIAGNOSIS — Z7982 Long term (current) use of aspirin: Secondary | ICD-10-CM | POA: Insufficient documentation

## 2019-05-25 DIAGNOSIS — F329 Major depressive disorder, single episode, unspecified: Secondary | ICD-10-CM | POA: Insufficient documentation

## 2019-05-25 DIAGNOSIS — K2950 Unspecified chronic gastritis without bleeding: Secondary | ICD-10-CM | POA: Diagnosis not present

## 2019-05-25 HISTORY — PX: ESOPHAGOGASTRODUODENOSCOPY (EGD) WITH PROPOFOL: SHX5813

## 2019-05-25 HISTORY — PX: COLONOSCOPY WITH PROPOFOL: SHX5780

## 2019-05-25 HISTORY — PX: BIOPSY: SHX5522

## 2019-05-25 LAB — SARS CORONAVIRUS 2 (TAT 6-24 HRS): SARS Coronavirus 2: NEGATIVE

## 2019-05-25 SURGERY — COLONOSCOPY WITH PROPOFOL
Anesthesia: Monitor Anesthesia Care

## 2019-05-25 MED ORDER — ONDANSETRON HCL 4 MG/2ML IJ SOLN
INTRAMUSCULAR | Status: AC
  Filled 2019-05-25: qty 2

## 2019-05-25 MED ORDER — PROPOFOL 500 MG/50ML IV EMUL
INTRAVENOUS | Status: DC | PRN
  Administered 2019-05-25: 200 ug/kg/min via INTRAVENOUS

## 2019-05-25 MED ORDER — ONDANSETRON HCL 4 MG/2ML IJ SOLN
INTRAMUSCULAR | Status: DC | PRN
  Administered 2019-05-25: 4 mg via INTRAVENOUS

## 2019-05-25 MED ORDER — KETAMINE HCL 10 MG/ML IJ SOLN
INTRAMUSCULAR | Status: DC | PRN
  Administered 2019-05-25: 20 mg via INTRAVENOUS

## 2019-05-25 MED ORDER — LACTATED RINGERS IV SOLN: INTRAVENOUS | Status: DC

## 2019-05-25 MED ORDER — KETAMINE HCL 50 MG/5ML IJ SOSY
PREFILLED_SYRINGE | INTRAMUSCULAR | Status: AC
  Filled 2019-05-25: qty 5

## 2019-05-25 MED ORDER — GLYCOPYRROLATE PF 0.2 MG/ML IJ SOSY
PREFILLED_SYRINGE | INTRAMUSCULAR | Status: AC
  Filled 2019-05-25: qty 1

## 2019-05-25 MED ORDER — PROPOFOL 10 MG/ML IV BOLUS
INTRAVENOUS | Status: DC | PRN
  Administered 2019-05-25: 50 mg via INTRAVENOUS
  Administered 2019-05-25: 100 mg via INTRAVENOUS

## 2019-05-25 MED ORDER — PROPOFOL 10 MG/ML IV BOLUS
INTRAVENOUS | Status: AC
  Filled 2019-05-25: qty 40

## 2019-05-25 MED ORDER — LIDOCAINE HCL (CARDIAC) PF 100 MG/5ML IV SOSY
PREFILLED_SYRINGE | INTRAVENOUS | Status: DC | PRN
  Administered 2019-05-25: 50 mg via INTRATRACHEAL

## 2019-05-25 MED ORDER — GLYCOPYRROLATE 0.2 MG/ML IJ SOLN
INTRAMUSCULAR | Status: DC | PRN
  Administered 2019-05-25: .2 mg via INTRAVENOUS

## 2019-05-25 NOTE — Anesthesia Postprocedure Evaluation (Signed)
Anesthesia Post Note  Patient: [name]  Procedure(s) Performed: COLONOSCOPY WITH PROPOFOL (N/A ) ESOPHAGOGASTRODUODENOSCOPY (EGD) WITH PROPOFOL (N/A ) BIOPSY  Patient location during evaluation: Endoscopy Anesthesia Type: MAC Level of consciousness: awake and alert Pain management: pain level controlled Vital Signs Assessment: post-procedure vital signs reviewed and stable Respiratory status: spontaneous breathing, nonlabored ventilation, respiratory function stable and patient connected to nasal cannula oxygen Cardiovascular status: blood pressure returned to baseline and stable Postop Assessment: no apparent nausea or vomiting Anesthetic complications: no     Last Vitals:  Vitals:   05/25/19 1230 05/25/19 1245  BP: 116/82 121/82  Pulse: 74 76  Resp: 13 15  Temp: 36.6 C   SpO2: 97% 97%    Last Pain:  Vitals:   05/25/19 1230  PainSc: 0-No pain                 Seth Mcmillan

## 2019-05-25 NOTE — H&P (Signed)
Primary Care Physician:  [name], DO Primary Gastroenterologist:  Dr. Oneida Alar  Pre-Procedure History & Physical: HPI:  Seth Mcmillan is a 54 y.o. male here for PERSONAL HISTORY OF POLYPS/dyspepsia.  Past Medical History:  Diagnosis Date  . Anxiety   . Arthritis   . Depression   . Fatty liver   . GERD (gastroesophageal reflux disease)   . Hyperlipidemia   . Hypertension   . IBS (irritable bowel syndrome)    Diarrhea type    Past Surgical History:  Procedure Laterality Date  . ANTERIOR CERVICAL DECOMP/DISCECTOMY FUSION N/A 05/18/2018   Procedure: C6-7. C7-T1 ANTERIOR CERVICAL DECOMPRESSION/DISCECTOMY FUSION, ALLOGRAFT, PLATE;  Surgeon: Marybelle Killings, MD;  Location: [facility];  Service: Orthopedics;  Laterality: N/A;  . BIOPSY  09/05/2015   Procedure: BIOPSY;  Surgeon: Danie Binder, MD;  [facility];  Service: Endoscopy;;  random colon bx  . COLONOSCOPY N/A 09/05/2015   Dr. Oneida Alar: 4 simple adenomas removed.  Next colonoscopy 3 years.  Marland Kitchen KNEE SURGERY Right 1997   bursa sac  . POLYPECTOMY  09/05/2015   Procedure: POLYPECTOMY;  Surgeon: Danie Binder, MD;  [facility];  Service: Endoscopy;;  colon polyps  . SHOULDER ARTHROSCOPY WITH BICEPS TENDON REPAIR Right 2011  . TONSILECTOMY, ADENOIDECTOMY, BILATERAL MYRINGOTOMY AND TUBES      Prior to Admission medications   Medication Sig Start Date End Date Taking? Authorizing Provider  amLODipine (NORVASC) 5 MG tablet Take 1 tablet (5 mg total) by mouth daily. 09/16/18  Yes Ronnie Doss M, DO  Ascorbic Acid (VITAMIN C) 1000 MG tablet Take 1,000 mg by mouth daily.   Yes [provider]  aspirin EC 81 MG tablet Take 81 mg by mouth daily.   Yes [provider]  cholecalciferol (VITAMIN D3) 25 MCG (1000 UT) tablet Take 1,000 Units by mouth daily.   Yes [provider]  escitalopram (LEXAPRO) 10 MG tablet Take 1 tablet (10 mg total) by mouth daily. (Needs to be seen before next  refill) 04/23/19  Yes Gottschalk, Ashly M, DO  omeprazole (PRILOSEC) 40 MG capsule Take 1 capsule (40 mg total) by mouth daily. (Needs to be seen before next refill) 04/23/19  Yes Ronnie Doss M, DO  vitamin B-12 (CYANOCOBALAMIN) 1000 MCG tablet Take 1,000 mcg by mouth daily.   Yes [provider]  vitamin E 400 UNIT capsule Take 400 Units by mouth daily.   Yes [provider]    Allergies as of 02/16/2019  . (No Known Allergies)    Family History  Problem Relation Age of Onset  . Heart disease Mother   . Heart disease Father   . Diabetes Brother   . Colon cancer Neg Hx     Social History   Socioeconomic History  . Marital status: Divorced    Spouse name: Not on file  . Number of children: Not on file  . Years of education: Not on file  . Highest education level: Not on file  Occupational History  . Not on file  Tobacco Use  . Smoking status: Former Smoker    Packs/day: 1.00    Years: 25.00    Pack years: 25.00    Types: Cigarettes    Quit date: 07/16/2017    Years since quitting: 1.8  . Smokeless tobacco: Never Used  Substance and Sexual Activity  . Alcohol use: Yes    Alcohol/week: 12.0 standard drinks    Types: 12 Cans of beer  per week    Comment: 12 cans of beer/week, mostly on weekends.  . Drug use: No  . Sexual activity: Not on file  Other Topics Concern  . Not on file  Social History Narrative  . Not on file   Social Determinants of Health   Financial Resource Strain:   . Difficulty of Paying Living Expenses: Not on file  Food Insecurity:   . Worried About Charity fundraiser in the Last Year: Not on file  . Ran Out of Food in the Last Year: Not on file  Transportation Needs:   . Lack of Transportation (Medical): Not on file  . Lack of Transportation (Non-Medical): Not on file  Physical Activity:   . Days of Exercise per Week: Not on file  . Minutes of Exercise per Session: Not on file  Stress:   . Feeling of Stress : Not on file   Social Connections:   . Frequency of Communication with Friends and Family: Not on file  . Frequency of Social Gatherings with Friends and Family: Not on file  . Attends Religious Services: Not on file  . Active Member of Clubs or Organizations: Not on file  . Attends Archivist Meetings: Not on file  . Marital Status: Not on file  Intimate Partner Violence:   . Fear of Current or Ex-Partner: Not on file  . Emotionally Abused: Not on file  . Physically Abused: Not on file  . Sexually Abused: Not on file    Review of Systems: See HPI, otherwise negative ROS   Physical Exam: BP (!) 152/95 (BP Location: Right Arm)   Pulse 77   Temp 97.7 F (36.5 C)   Resp 18   SpO2 95%  General:   Alert,  pleasant and cooperative in NAD Head:  Normocephalic and atraumatic. Neck:  Supple; Lungs:  Clear throughout to auscultation.    Heart:  Regular rate and rhythm. Abdomen:  Soft, nontender and nondistended. Normal bowel sounds, without guarding, and without rebound.   Neurologic:  Alert and  oriented x4;  grossly normal neurologically.  Impression/Plan:   PERSONAL HISTORY OF POLYPS/dyspepsia.  PLAN: 1. TCS/EGD TODAY. DISCUSSED PROCEDURE, BENEFITS, & RISKS: < 1% chance of medication reaction, bleeding, perforation, ASPIRATION, or rupture of spleen/liver requiring surgery to fix it and missed polyps < 1 cm 10-20% of the time.

## 2019-05-25 NOTE — Op Note (Signed)
[facility] Patient Name: Seth Mcmillan Procedure Date: 05/25/2019 11:52 AM MRN: [id number] Date of Birth: December 14, 1965 Attending MD: Barney Drain MD, MD CSN: [id number] Age: 54 Admit Type: Outpatient Procedure:                Colonoscopy, surveillance Indications:              Personal history of colonic polyps Providers:                Barney Drain MD, MD, Janeece Riggers, RN, Randa Spike, Technician Referring MD:             Koleen Distance. Gottschalk Medicines:                Propofol per Anesthesia Complications:            No immediate complications. Estimated Blood Loss:     Estimated blood loss: none. Procedure:                Pre-Anesthesia Assessment:                           - Prior to the procedure, a History and Physical                            was performed, and patient medications and                            allergies were reviewed. The patient's tolerance of                            previous anesthesia was also reviewed. The risks                            and benefits of the procedure and the sedation                            options and risks were discussed with the patient.                            All questions were answered, and informed consent                            was obtained. Prior Anticoagulants: The patient has                            taken no previous anticoagulant or antiplatelet                            agents. ASA Grade Assessment: II - A patient with                            mild systemic disease. After reviewing the risks  and benefits, the patient was deemed in                            satisfactory condition to undergo the procedure.                            After obtaining informed consent, the colonoscope                            was passed under direct vision. Throughout the                            procedure, the patient's blood pressure, pulse, and         oxygen saturations were monitored continuously. The                            [id number] scope was introduced through                            the anus and advanced to the the cecum, identified                            by appendiceal orifice and ileocecal valve. The                            colonoscopy was somewhat difficult due to                            significant looping. Successful completion of the                            procedure was aided by increasing the dose of                            sedation medication, straightening and shortening                            the scope to obtain bowel loop reduction and                            COLOWRAP. The patient tolerated the procedure                            fairly well. The quality of the bowel preparation                            was good. The ileocecal valve, appendiceal orifice,                            and rectum were photographed. Scope In: 12:03:31 PM Scope Out: 12:19:40 PM Scope Withdrawal Time: 0 hours 11 minutes 37 seconds  Total Procedure Duration: 0 hours 16 minutes 9 seconds  Findings:      Internal hemorrhoids were found. The hemorrhoids were  moderate.      The recto-sigmoid colon, sigmoid colon, descending colon and splenic       flexure were moderately tortuous.      The exam was otherwise without abnormality. Impression:               - Internal hemorrhoids.                           - Tortuous LEFT colon.                           - The examination was otherwise normal. Moderate Sedation:      Per Anesthesia Care Recommendation:           - Patient has a contact number available for                            emergencies. The signs and symptoms of potential                            delayed complications were discussed with the                            patient. Return to normal activities tomorrow.                            Written discharge instructions were provided to  the                            patient.                           - High fiber diet.                           - Continue present medications.                           - Repeat colonoscopy in 5 years for surveillance. Procedure Code(s):        --- Professional ---                           [phone], Colonoscopy, flexible; diagnostic, including                            collection of specimen(s) by brushing or washing,                            when performed (separate procedure) Diagnosis Code(s):        --- Professional ---                           [id number], Other hemorrhoids                           Z86.010, Personal history of colonic polyps  Q43.8, Other specified congenital malformations of                            intestine CPT copyright 2019 American Medical Association. All rights reserved. The codes documented in this report are preliminary and upon coder review may  be revised to meet current compliance requirements. Barney Drain, MD Barney Drain MD, MD 05/25/2019 12:51:22 PM This report has been signed electronically. Number of Addenda: 0

## 2019-05-25 NOTE — Op Note (Signed)
[facility] Patient Name: Seth Mcmillan Procedure Date: 05/25/2019 12:22 PM MRN: [id number] Date of Birth: 12-23-1965 Attending MD: Barney Drain MD, MD CSN: [id number] Age: 55 Admit Type: Outpatient Procedure:                Upper GI endoscopy WITH COLD FORCEPS BIOPSY Indications:              Abdominal pain in the right upper quadrant Providers:                Barney Drain MD, MD, Janeece Riggers, RN, Randa Spike, Technician Referring MD:             Koleen Distance. Gottschalk Medicines:                Propofol per Anesthesia Complications:            No immediate complications. Estimated Blood Loss:     Estimated blood loss was minimal. Procedure:                Pre-Anesthesia Assessment:                           - Prior to the procedure, a History and Physical                            was performed, and patient medications and                            allergies were reviewed. The patient's tolerance of                            previous anesthesia was also reviewed. The risks                            and benefits of the procedure and the sedation                            options and risks were discussed with the patient.                            All questions were answered, and informed consent                            was obtained. Prior Anticoagulants: The patient has                            taken no previous anticoagulant or antiplatelet                            agents except for aspirin. ASA Grade Assessment: II                            - A patient with mild systemic disease. After  reviewing the risks and benefits, the patient was                            deemed in satisfactory condition to undergo the                            procedure. After obtaining informed consent, the                            endoscope was passed under direct vision.                            Throughout the procedure, the patient's  blood                            pressure, pulse, and oxygen saturations were                            monitored continuously. The [id number]                            scope was introduced through the mouth, and                            advanced to the second part of duodenum. The upper                            GI endoscopy was accomplished without difficulty.                            The patient tolerated the procedure well. Scope In: 12:25:49 PM Scope Out: 12:32:34 PM Total Procedure Duration: 0 hours 6 minutes 45 seconds  Findings:      A low-grade of narrowing Schatzki ring was found at the gastroesophageal       junction.      Localized mild inflammation characterized by congestion (edema) and       erythema was found on the greater curvature of the stomach and in the       gastric antrum. Biopsies(2:BODY,1:INCISURA,2:ANTRUM) were taken with a       cold forceps for Helicobacter pylori testing.      The examined duodenum was normal. Impression:               - Schatzki ring.                           - MILD Gastritis DUE TO ASA. Biopsied. Moderate Sedation:      Per Anesthesia Care Recommendation:           - Patient has a contact number available for                            emergencies. The signs and symptoms of potential                            delayed complications were discussed with the  patient. Return to normal activities tomorrow.                            Written discharge instructions were provided to the                            patient.                           - High fiber diet.                           - Continue present medications.                           - Await pathology results.                           - Return to GI office in 6 months. Procedure Code(s):        --- Professional ---                           [phone], Esophagogastroduodenoscopy, flexible,                            transoral; with biopsy,  single or multiple Diagnosis Code(s):        --- Professional ---                           K22.2, Esophageal obstruction                           K29.70, Gastritis, unspecified, without bleeding                           R10.11, Right upper quadrant pain CPT copyright 2019 American Medical Association. All rights reserved. The codes documented in this report are preliminary and upon coder review may  be revised to meet current compliance requirements. Barney Drain, MD Barney Drain MD, MD 05/25/2019 12:59:47 PM This report has been signed electronically. Number of Addenda: 0

## 2019-05-25 NOTE — Transfer of Care (Signed)
Immediate Anesthesia Transfer of Care Note  Patient: Seth Mcmillan  Procedure(s) Performed: COLONOSCOPY WITH PROPOFOL (N/A ) ESOPHAGOGASTRODUODENOSCOPY (EGD) WITH PROPOFOL (N/A ) BIOPSY  Patient Location: PACU  Anesthesia Type:MAC  Level of Consciousness: awake, alert  and oriented  Airway & Oxygen Therapy: Patient Spontanous Breathing  Post-op Assessment: Report given to RN and Post -op Vital signs reviewed and stable  Post vital signs: Reviewed and stable  Last Vitals:  Vitals Value Taken Time  BP 116/82 05/25/19 1241  Temp    Pulse 74 05/25/19 1244  Resp 13 05/25/19 1244  SpO2 96 % 05/25/19 1244  Vitals shown include unvalidated device data.  Last Pain:  Vitals:   05/25/19 1100  PainSc: 0-No pain         Complications: No apparent anesthesia complications

## 2019-05-25 NOTE — Anesthesia Preprocedure Evaluation (Signed)
Anesthesia Evaluation  Patient identified by MRN, date of birth, ID band Patient awake    Reviewed: Allergy & Precautions, H&P , NPO status , Patient's Chart, lab work & pertinent test results, reviewed documented beta blocker date and time   Airway Mallampati: II       Dental no notable dental hx. (+) Teeth Intact   Pulmonary former smoker,    Pulmonary exam normal        Cardiovascular hypertension, Normal cardiovascular exam     Neuro/Psych PSYCHIATRIC DISORDERS Anxiety Depression negative neurological ROS     GI/Hepatic Neg liver ROS, Bowel prep,GERD  ,  Endo/Other  negative endocrine ROS  Renal/GU negative Renal ROS  negative genitourinary   Musculoskeletal   Abdominal   Peds  Hematology negative hematology ROS (+)   Anesthesia Other Findings   Reproductive/Obstetrics negative OB ROS                             Anesthesia Physical Anesthesia Plan  ASA: II  Anesthesia Plan: MAC   Post-op Pain Management:    Induction:   PONV Risk Score and Plan: 1 and Propofol infusion  Airway Management Planned:   Additional Equipment:   Intra-op Plan:   Post-operative Plan:   Informed Consent: I have reviewed the patients History and Physical, chart, labs and discussed the procedure including the risks, benefits and alternatives for the proposed anesthesia with the patient or authorized representative who has indicated his/her understanding and acceptance.       Plan Discussed with: CRNA  Anesthesia Plan Comments:         Anesthesia Quick Evaluation

## 2019-05-25 NOTE — Discharge Instructions (Signed)
YOU HAVE MODERATE internal hemorrhoids. You have gastritis MOST LIKELY DUE TO ASPIRIN.  I biopsied your stomach.   DRINK WATER TO KEEP YOUR URINE LIGHT YELLOW.  FOLLOW A HIGH FIBER/LOW FAT DIET. AVOID ITEMS THAT CAUSE BLOATING. SEE INFO BELOW.   CONTINUE OMEPRAZOLE.  TAKE 30 MINUTES PRIOR TO YOUR FIRST MEAL.  USE PREPARATION H FOUR TIMES  A DAY IF NEEDED TO RELIEVE RECTAL PAIN/PRESSURE/BLEEDING.   YOUR BIOPSY RESULTS WILL BE BACK IN 5 BUSINESS DAYS.  FOLLOW UP IN 6 MOS.    Next colonoscopy in 5 years.   ENDOSCOPY Care After Read the instructions outlined below and refer to this sheet in the next week. These discharge instructions provide you with general information on caring for yourself after you leave the hospital. While your treatment has been planned according to the most current medical practices available, unavoidable complications occasionally occur. If you have any problems or questions after discharge, call DR. FIELDS, [phone].  ACTIVITY  You may resume your regular activity, but move at a slower pace for the next 24 hours.   Take frequent rest periods for the next 24 hours.   Walking will help get rid of the air and reduce the bloated feeling in your belly (abdomen).   No driving for 24 hours (because of the medicine (anesthesia) used during the test).   You may shower.   Do not sign any important legal documents or operate any machinery for 24 hours (because of the anesthesia used during the test).    NUTRITION  Drink plenty of fluids.   You may resume your normal diet as instructed by your doctor.   Begin with a light meal and progress to your normal diet. Heavy or fried foods are harder to digest and may make you feel sick to your stomach (nauseated).   Avoid alcoholic beverages for 24 hours or as instructed.    MEDICATIONS  You may resume your normal medications.   WHAT YOU CAN EXPECT TODAY  Some feelings of bloating in the abdomen.    Passage of more gas than usual.   Spotting of blood in your stool or on the toilet paper  .  IF YOU HAD POLYPS REMOVED DURING THE ENDOSCOPY:  Eat a soft diet IF YOU HAVE NAUSEA, BLOATING, ABDOMINAL PAIN, OR VOMITING.    FINDING OUT THE RESULTS OF YOUR TEST Not all test results are available during your visit. DR. Oneida Alar WILL CALL YOU WITHIN 14 DAYS OF YOUR PROCEDUE WITH YOUR RESULTS. Do not assume everything is normal if you have not heard from DR. FIELDS IN TWO WEEKS, CALL HER OFFICE AT [phone].  SEEK IMMEDIATE MEDICAL ATTENTION AND CALL THE OFFICE: [phone] IF:  You have more than a spotting of blood in your stool.   Your belly is swollen (abdominal distention).   You are nauseated or vomiting.   You have a temperature over 101F.   You have abdominal pain or discomfort that is severe or gets worse throughout the day.   Gastritis  Gastritis is an inflammation (the body's way of reacting to injury and/or infection) of the stomach. It is often caused by viral or bacterial (germ) infections. It can also be caused BY ALCOHOL, ASPIRIN, BC/GOODY POWDER'S, (IBUPROFEN) MOTRIN, OR ALEVE (NAPROXEN), chemicals (including alcohol), SPICY FOODS, and medications. This illness may be associated with generalized malaise (feeling tired, not well), UPPER ABDOMINAL STOMACH cramps, and fever. One common bacterial cause of gastritis is an organism known as H. Pylori. This can be  treated with antibiotics.    High-Fiber Diet A high-fiber diet changes your normal diet to include more whole grains, legumes, fruits, and vegetables. Changes in the diet involve replacing refined carbohydrates with unrefined foods. The calorie level of the diet is essentially unchanged. The Dietary Reference Intake (recommended amount) for adult males is 38 grams per day. For adult females, it is 25 grams per day. Pregnant and lactating women should consume 28 grams of fiber per day. Fiber is the intact part of a  plant that is not broken down during digestion. Functional fiber is fiber that has been isolated from the plant to provide a beneficial effect in the body.  PURPOSE Increase stool bulk.  Ease and regulate bowel movements.  Lower cholesterol.  REDUCE RISK OF COLON CANCER  INDICATIONS THAT YOU NEED MORE FIBER Constipation and hemorrhoids.  Uncomplicated diverticulosis (intestine condition) and irritable bowel syndrome.  Weight management.  As a protective measure against hardening of the arteries (atherosclerosis), diabetes, and cancer.   GUIDELINES FOR INCREASING FIBER IN THE DIET Start adding fiber to the diet slowly. A gradual increase of about 5 more grams (2 servings of most fruits or vegetables) per day is best. Too rapid an increase in fiber may result in constipation, flatulence, and bloating.  Drink enough water and fluids to keep your urine clear or pale yellow. Water, juice, or caffeine-free drinks are recommended. Not drinking enough fluid may cause constipation.  Eat a variety of high-fiber foods rather than one type of fiber.  Try to increase your intake of fiber through using high-fiber foods rather than fiber pills or supplements that contain small amounts of fiber.  The goal is to change the types of food eaten. Do not supplement your present diet with high-fiber foods, but replace foods in your present diet.   Hemorrhoids Hemorrhoids are dilated (enlarged) veins around the rectum. Sometimes clots will form in the veins. This makes them swollen and painful. These are called thrombosed hemorrhoids. Causes of hemorrhoids include:  Constipation.   Straining to have a bowel movement.   HEAVY LIFTING HOME CARE INSTRUCTIONS  Eat a well balanced diet and drink 6 to 8 glasses of water every day to avoid constipation. You may also use a bulk laxative.   Avoid straining to have bowel movements.   Keep anal area dry and clean.   Do not use a donut shaped pillow or sit on the  toilet for long periods. This increases blood pooling and pain.   Move your bowels when your body has the urge; this will require less straining and will decrease pain and pressure.

## 2019-05-26 ENCOUNTER — Other Ambulatory Visit: Payer: Self-pay

## 2019-05-26 ENCOUNTER — Encounter: Payer: Self-pay | Admitting: *Deleted

## 2019-05-26 ENCOUNTER — Telehealth: Payer: Self-pay | Admitting: Gastroenterology

## 2019-05-26 LAB — SURGICAL PATHOLOGY

## 2019-05-26 NOTE — Telephone Encounter (Signed)
Patient called and said he needs a note, preferably by tomorrow morning, that says we required him to have a covid test before his procedure.   His job is giving him a hard time because he had to quarantine and could not work

## 2019-05-26 NOTE — Telephone Encounter (Signed)
Prepared note for pt.  Tried to call pt back.  LMOM for pt to call me back.  Will leave note up front for pt to pick up.

## 2019-06-02 ENCOUNTER — Other Ambulatory Visit: Payer: Self-pay | Admitting: Family Medicine

## 2019-06-02 DIAGNOSIS — K219 Gastro-esophageal reflux disease without esophagitis: Secondary | ICD-10-CM

## 2019-06-23 ENCOUNTER — Other Ambulatory Visit: Payer: Self-pay | Admitting: Family Medicine

## 2019-06-23 DIAGNOSIS — K219 Gastro-esophageal reflux disease without esophagitis: Secondary | ICD-10-CM

## 2019-06-23 MED ORDER — OMEPRAZOLE 40 MG PO CPDR: 40.0000 mg | DELAYED_RELEASE_CAPSULE | Freq: Every day | ORAL | 0 refills | Status: DC

## 2019-06-23 NOTE — Telephone Encounter (Signed)
  Prescription Request  06/23/2019  What is the name of the medication or equipment? Omeprazole   Have you contacted your pharmacy to request a refill? (if applicable) yes  Which pharmacy would you like this sent to? [facility] in [place]   Patient notified that their request is being sent to the clinical staff for review and that they should receive a response within 2 business days.

## 2019-06-23 NOTE — Telephone Encounter (Signed)
30 day supply sent in, appointment made for patient to see Dr. Lajuana Ripple on 07/06/2019.

## 2019-07-06 ENCOUNTER — Ambulatory Visit: Payer: 59 | Admitting: Family Medicine

## 2019-07-06 ENCOUNTER — Other Ambulatory Visit: Payer: Self-pay

## 2019-07-06 ENCOUNTER — Encounter: Payer: Self-pay | Admitting: Family Medicine

## 2019-07-06 VITALS — BP 136/82 | HR 75 | Temp 98.0°F | Ht 71.0 in | Wt 206.0 lb

## 2019-07-06 DIAGNOSIS — E782 Mixed hyperlipidemia: Secondary | ICD-10-CM | POA: Diagnosis not present

## 2019-07-06 DIAGNOSIS — K219 Gastro-esophageal reflux disease without esophagitis: Secondary | ICD-10-CM | POA: Diagnosis not present

## 2019-07-06 DIAGNOSIS — I10 Essential (primary) hypertension: Secondary | ICD-10-CM

## 2019-07-06 DIAGNOSIS — H00014 Hordeolum externum left upper eyelid: Secondary | ICD-10-CM

## 2019-07-06 DIAGNOSIS — F4322 Adjustment disorder with anxiety: Secondary | ICD-10-CM

## 2019-07-06 DIAGNOSIS — Z125 Encounter for screening for malignant neoplasm of prostate: Secondary | ICD-10-CM

## 2019-07-06 MED ORDER — OMEPRAZOLE 40 MG PO CPDR: 40.0000 mg | DELAYED_RELEASE_CAPSULE | Freq: Every day | ORAL | 3 refills | Status: DC

## 2019-07-06 MED ORDER — ERYTHROMYCIN 5 MG/GM OP OINT: 1.0000 "application " | TOPICAL_OINTMENT | Freq: Three times a day (TID) | OPHTHALMIC | 0 refills | Status: DC

## 2019-07-06 MED ORDER — ROSUVASTATIN CALCIUM 40 MG PO TABS: 40.0000 mg | ORAL_TABLET | Freq: Every day | ORAL | 3 refills | Status: DC

## 2019-07-06 MED ORDER — AMLODIPINE BESYLATE 5 MG PO TABS: 5.0000 mg | ORAL_TABLET | Freq: Every day | ORAL | 3 refills | Status: DC

## 2019-07-06 MED ORDER — ESCITALOPRAM OXALATE 10 MG PO TABS: 10.0000 mg | ORAL_TABLET | Freq: Every day | ORAL | 3 refills | Status: DC

## 2019-07-06 NOTE — Patient Instructions (Addendum)
You had labs performed today.  You will be contacted with the results of the labs once they are available, usually in the next 3 business days for routine lab work.  If you have an active my chart account, they will be released to your MyChart.  If you prefer to have these labs released to you via telephone, please let us know.  If you had a pap smear or biopsy performed, expect to be contacted in about 7-10 days.   Stye  A stye, also known as a hordeolum, is a bump that forms on an eyelid. It may look like a pimple next to the eyelash. A stye can form inside the eyelid (internal stye) or outside the eyelid (external stye). A stye can cause redness, swelling, and pain on the eyelid. Styes are very common. Anyone can get them at any age. They usually occur in just one eye, but you may have more than one in either eye. What are the causes? A stye is caused by an infection. The infection is almost always caused by bacteria called Staphylococcus aureus. This is a common type of bacteria that lives on the skin. An internal stye may result from an infected oil-producing gland inside the eyelid. An external stye may be caused by an infection at the base of the eyelash (hair follicle). What increases the risk? You are more likely to develop a stye if:  You have had a stye before.  You have any of these conditions: ? Diabetes. ? Red, itchy, inflamed eyelids (blepharitis). ? A skin condition such as seborrheic dermatitis or rosacea. ? High fat levels in your blood (lipids). What are the signs or symptoms? The most common symptom of a stye is eyelid pain. Internal styes are more painful than external styes. Other symptoms may include:  Painful swelling of your eyelid.  A scratchy feeling in your eye.  Tearing and redness of your eye.  Pus draining from the stye. How is this diagnosed? Your health care provider may be able to diagnose a stye just by examining your eye. The health care provider  may also check to make sure:  You do not have a fever or other signs of a more serious infection.  The infection has not spread to other parts of your eye or areas around your eye. How is this treated? Most styes will clear up in a few days without treatment or with warm compresses applied to the area. You may need to use antibiotic drops or ointment to treat an infection. In some cases, if your stye does not heal with routine treatment, your health care provider may drain pus from the stye using a thin blade or needle. This may be done if the stye is large, causing a lot of pain, or affecting your vision. Follow these instructions at home:  Take over-the-counter and prescription medicines only as told by your health care provider. This includes eye drops or ointments.  If you were prescribed an antibiotic medicine, apply or use it as told by your health care provider. Do not stop using the antibiotic even if your condition improves.  Apply a warm, wet cloth (warm compress) to your eye for 5-10 minutes, 4 times a day.  Clean the affected eyelid as directed by your health care provider.  Do not wear contact lenses or eye makeup until your stye has healed.  Do not try to pop or drain the stye.  Do not rub your eye. Contact a health care  provider if:  You have chills or a fever.  Your stye does not go away after several days.  Your stye affects your vision.  Your eyeball becomes swollen, red, or painful. Get help right away if:  You have pain when moving your eye around. Summary  A stye is a bump that forms on an eyelid. It may look like a pimple next to the eyelash.  A stye can form inside the eyelid (internal stye) or outside the eyelid (external stye). A stye can cause redness, swelling, and pain on the eyelid.  Your health care provider may be able to diagnose a stye just by examining your eye.  Apply a warm, wet cloth (warm compress) to your eye for 5-10 minutes, 4 times  a day. This information is not intended to replace advice given to you by your health care provider. Make sure you discuss any questions you have with your health care provider. Document Revised: 02/14/2017 Document Reviewed: 11/14/2016 Elsevier Patient Education  2020 [employer].

## 2019-07-06 NOTE — Progress Notes (Signed)
Subjective: CC: follow up HTN, HLD, GAD PCP: [name], DO RSW:NIOE H Kaus is a 54 y.o. male presenting to clinic today for:  1. HTN, HLD Patient reports compliance with Crestor 40 mg daily, Norvasc '5mg'$  daily. Denies CP, SOB, edema, falls, dizziness.  2. GERD Compliant with omeprazole 40 mg daily.  No abdominal pain, nausea, vomiting, hematochezia or melena.  3. GAD Reports compliance with Lexapro 10 mg daily.  This was started after his brother became ill.  He has considered tapering off of the medication.  No SI, HI.  Mood is stable.  4.  Eye lesion Patient reports he has a stye on the left upper eyelid.  He reports sand-like sensation in the eye with occasional blurring but this is due to discharge not from visual disturbance.  He denies any pain with eye movement.  Has been using warm compresses but symptoms are not resolving.   ROS: Per HPI  No Known Allergies Past Medical History:  Diagnosis Date  . Anxiety   . Arthritis   . Depression   . Fatty liver   . GERD (gastroesophageal reflux disease)   . Hyperlipidemia   . Hypertension   . IBS (irritable bowel syndrome)    Diarrhea type    Current Outpatient Medications:  .  amLODipine (NORVASC) 5 MG tablet, Take 1 tablet (5 mg total) by mouth daily., Disp: 90 tablet, Rfl: 1 .  Ascorbic Acid (VITAMIN C) 1000 MG tablet, Take 1,000 mg by mouth daily., Disp: , Rfl:  .  aspirin EC 81 MG tablet, Take 81 mg by mouth daily., Disp: , Rfl:  .  cholecalciferol (VITAMIN D3) 25 MCG (1000 UT) tablet, Take 1,000 Units by mouth daily., Disp: , Rfl:  .  escitalopram (LEXAPRO) 10 MG tablet, Take 1 tablet (10 mg total) by mouth daily. (Needs to be seen before next refill), Disp: 30 tablet, Rfl: 0 .  omeprazole (PRILOSEC) 40 MG capsule, Take 1 capsule (40 mg total) by mouth daily. (Needs to be seen before next refill), Disp: 30 capsule, Rfl: 0 .  vitamin B-12 (CYANOCOBALAMIN) 1000 MCG tablet, Take 1,000 mcg by mouth daily., Disp:  , Rfl:  .  vitamin E 400 UNIT capsule, Take 400 Units by mouth daily., Disp: , Rfl:  Social History   Socioeconomic History  . Marital status: Divorced    Spouse name: Not on file  . Number of children: Not on file  . Years of education: Not on file  . Highest education level: Not on file  Occupational History  . Not on file  Tobacco Use  . Smoking status: Former Smoker    Packs/day: 1.00    Years: 25.00    Pack years: 25.00    Types: Cigarettes    Quit date: 07/16/2017    Years since quitting: 1.9  . Smokeless tobacco: Never Used  Substance and Sexual Activity  . Alcohol use: Yes    Alcohol/week: 12.0 standard drinks    Types: 12 Cans of beer per week    Comment: 12 cans of beer/week, mostly on weekends.  . Drug use: No  . Sexual activity: Not on file  Other Topics Concern  . Not on file  Social History Narrative  . Not on file   Social Determinants of Health   Financial Resource Strain:   . Difficulty of Paying Living Expenses:   Food Insecurity:   . Worried About Charity fundraiser in the Last Year:   . [employer]  of Food in the Last Year:   Transportation Needs:   . Film/video editor (Medical):   Marland Kitchen Lack of Transportation (Non-Medical):   Physical Activity:   . Days of Exercise per Week:   . Minutes of Exercise per Session:   Stress:   . Feeling of Stress :   Social Connections:   . Frequency of Communication with Friends and Family:   . Frequency of Social Gatherings with Friends and Family:   . Attends Religious Services:   . Active Member of Clubs or Organizations:   . Attends Archivist Meetings:   Marland Kitchen Marital Status:   Intimate Partner Violence:   . Fear of Current or Ex-Partner:   . Emotionally Abused:   Marland Kitchen Physically Abused:   . Sexually Abused:    Family History  Problem Relation Age of Onset  . Heart disease Mother   . Heart disease Father   . Diabetes Brother   . Colon cancer Neg Hx     Objective: Office vital signs  reviewed. BP 136/82   Pulse 75   Temp 98 F (36.7 C) (Temporal)   Ht '5\' 11"'$  (1.803 m)   Wt 206 lb (93.4 kg)   SpO2 98%   BMI 28.73 kg/m   Physical Examination:  General: Awake, alert, well nourished, No acute distress HEENT: Normal, sclera white, MMM; left upper eyelid with small stye noted.  No significant soft tissue swelling or discharge. Cardio: regular rate and rhythm, S1S2 heard, no murmurs appreciated Pulm: clear to auscultation bilaterally, no wheezes, rhonchi or rales; normal work of breathing on room air Extremities: warm, well perfused, No edema, cyanosis or clubbing; +2 pulses bilaterally Psych: Mood stable, speech normal, after appropriate, pleasant interactive Depression screen [facility] 2/9 07/06/2019 04/14/2018 02/25/2018 10/01/2017 03/20/2017  Decreased Interest 0 0 0 0 0  Down, Depressed, Hopeless 0 0 0 0 0  PHQ - 2 Score 0 0 0 0 0  Altered sleeping 0 0 - - -  Tired, decreased energy 1 0 - - -  Change in appetite 0 0 - - -  Feeling bad or failure about yourself  0 0 - - -  Trouble concentrating 0 0 - - -  Moving slowly or fidgety/restless 0 0 - - -  Suicidal thoughts 0 0 - - -  PHQ-9 Score 1 0 - - -   GAD 7 : Generalized Anxiety Score 07/06/2019 04/14/2018  Nervous, Anxious, on Edge 0 0  Control/stop worrying 1 0  Worry too much - different things 1 1  Trouble relaxing 0 1  Restless 0 0  Easily annoyed or irritable 1 1  Afraid - awful might happen 0 0  Total GAD 7 Score 3 3  Anxiety Difficulty Not difficult at all Not difficult at all   Assessment/ Plan: 54 y.o. male   1. Essential hypertension Controlled.  Continue current regimen - CMP14+EGFR; Future - amLODipine (NORVASC) 5 MG tablet; Take 1 tablet (5 mg total) by mouth daily.  Dispense: 90 tablet; Refill: 3 - CMP14+EGFR  2. Mixed hyperlipidemia Check labs.  Resume Crestor - CMP14+EGFR; Future - Lipid panel; Future - rosuvastatin (CRESTOR) 40 MG tablet; Take 1 tablet (40 mg total) by mouth daily.   Dispense: 90 tablet; Refill: 3 - Lipid panel - CMP14+EGFR  3. Gastroesophageal reflux disease without esophagitis Controlled.  Continue PPI - omeprazole (PRILOSEC) 40 MG capsule; Take 1 capsule (40 mg total) by mouth daily.  Dispense: 90 capsule; Refill: 3  4. Adjustment  disorder with anxious mood Stable.  We discussed that if he plans on tapering he should contact me.  Would consider 5 mg for 4 weeks then 5 mg every other day for 2 weeks then off - escitalopram (LEXAPRO) 10 MG tablet; Take 1 tablet (10 mg total) by mouth daily.  Dispense: 90 tablet; Refill: 3  5. Screening for malignant neoplasm of prostate - PSA; Future - PSA  6. Hordeolum externum of left upper eyelid Reinforced warm compresses. - erythromycin ophthalmic ointment; Place 1 application into the left eye 3 (three) times daily. x7-10 days  Dispense: 3.5 g; Refill: 0    No orders of the defined types were placed in this encounter.  No orders of the defined types were placed in this encounter.    [name], DO [place] [phone]

## 2019-07-07 LAB — CMP14+EGFR
ALT: 35 IU/L (ref 0–44)
AST: 31 IU/L (ref 0–40)
Albumin/Globulin Ratio: 2.6 — ABNORMAL HIGH (ref 1.2–2.2)
Albumin: 4.7 g/dL (ref 3.8–4.9)
Alkaline Phosphatase: 62 IU/L (ref 39–117)
BUN/Creatinine Ratio: 13 (ref 9–20)
BUN: 12 mg/dL (ref 6–24)
Bilirubin Total: 0.2 mg/dL (ref 0.0–1.2)
CO2: 21 mmol/L (ref 20–29)
Calcium: 9 mg/dL (ref 8.7–10.2)
Chloride: 101 mmol/L (ref 96–106)
Creatinine, Ser: 0.93 mg/dL (ref 0.76–1.27)
GFR calc Af Amer: 106 mL/min/{1.73_m2} (ref >59)
GFR calc non Af Amer: 92 mL/min/{1.73_m2} (ref >59)
Globulin, Total: 1.8 g/dL (ref 1.5–4.5)
Glucose: 86 mg/dL (ref 65–99)
Potassium: 4.2 mmol/L (ref 3.5–5.2)
Sodium: 137 mmol/L (ref 134–144)
Total Protein: 6.5 g/dL (ref 6.0–8.5)

## 2019-07-07 LAB — LIPID PANEL
Chol/HDL Ratio: 5.7 ratio — ABNORMAL HIGH (ref 0.0–5.0)
Cholesterol, Total: 258 mg/dL — ABNORMAL HIGH (ref 100–199)
HDL: 45 mg/dL (ref >39)
LDL Chol Calc (NIH): 125 mg/dL — ABNORMAL HIGH (ref 0–99)
Triglycerides: 489 mg/dL — ABNORMAL HIGH (ref 0–149)
VLDL Cholesterol Cal: 88 mg/dL — ABNORMAL HIGH (ref 5–40)

## 2019-07-07 LAB — PSA: Prostate Specific Ag, Serum: 3.1 ng/mL (ref 0.0–4.0)

## 2019-07-21 ENCOUNTER — Encounter: Payer: Self-pay | Admitting: Family Medicine

## 2019-07-21 ENCOUNTER — Ambulatory Visit (INDEPENDENT_AMBULATORY_CARE_PROVIDER_SITE_OTHER): Payer: 59

## 2019-07-21 ENCOUNTER — Other Ambulatory Visit: Payer: Self-pay

## 2019-07-21 ENCOUNTER — Ambulatory Visit: Payer: 59 | Admitting: Family Medicine

## 2019-07-21 VITALS — BP 131/83 | HR 86 | Temp 98.2°F | Ht 71.0 in | Wt 204.6 lb

## 2019-07-21 DIAGNOSIS — M7701 Medial epicondylitis, right elbow: Secondary | ICD-10-CM

## 2019-07-21 DIAGNOSIS — M25521 Pain in right elbow: Secondary | ICD-10-CM

## 2019-07-21 MED ORDER — PREDNISONE 10 MG PO TABS: ORAL_TABLET | ORAL | 0 refills | Status: DC

## 2019-07-21 NOTE — Progress Notes (Signed)
flexo Chief Complaint  Patient presents with  . Elbow Pain    Right, No injury    HPI  Patient presents today for slowly increasing pain over the last few weeks at the right elbow.  He points to the medial elbow but there is pain throughout the joint.  He has full use of the arm but it is painful for him.  He mentions trying to plant a tree a couple of days ago and the just about could not do it because it hurt so bad.  There is no known injury.  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  PMH: Smoking status noted ROS: Per HPI  Objective: BP 131/83   Pulse 86   Temp 98.2 F (36.8 C) (Temporal)   Ht 5\' 11"  (1.803 m)   Wt 204 lb 9.6 oz (92.8 kg)   BMI 28.54 kg/m  Gen: NAD, alert, cooperative with exam HEENT: NCAT, EOMI, PERRL Ext: No edema, warm.  He is tender over the medial epicondyle on the right.  There is full range of motion.  For the elbow and right upper extremity.  Right upper extremity is neurovascularly intact. Neuro: Alert and oriented, No gross deficits  Assessment and plan:  1. Elbow pain, right   2. Epicondylitis elbow, medial, right     Meds ordered this encounter  Medications  . predniSONE (DELTASONE) 10 MG tablet    Sig: Take 5 daily for 2 days followed by 4,3,2 and 1 for 2 days each.    Dispense:  30 tablet    Refill:  0    Orders Placed This Encounter  Procedures  . DG Elbow 2 Views Right    Standing Status:   Future    Number of Occurrences:   1    Standing Expiration Date:   09/19/2020    Order Specific Question:   Reason for Exam (SYMPTOM  OR DIAGNOSIS REQUIRED)    Answer:   Right Elbow pain for 1 week    Order Specific Question:   Preferred imaging location?    Answer:   Internal    Follow up as needed.  Claretta Fraise, MD

## 2019-08-01 ENCOUNTER — Other Ambulatory Visit: Payer: Self-pay | Admitting: Family Medicine

## 2019-08-01 DIAGNOSIS — K219 Gastro-esophageal reflux disease without esophagitis: Secondary | ICD-10-CM

## 2019-09-07 ENCOUNTER — Telehealth: Payer: Self-pay | Admitting: Family Medicine

## 2019-09-07 NOTE — Telephone Encounter (Signed)
Pt scheduled tomorrow in after hours for virtual visit at 5:30.

## 2019-09-08 ENCOUNTER — Telehealth (INDEPENDENT_AMBULATORY_CARE_PROVIDER_SITE_OTHER): Payer: 59 | Admitting: Family Medicine

## 2019-09-08 ENCOUNTER — Encounter: Payer: Self-pay | Admitting: Family Medicine

## 2019-09-08 DIAGNOSIS — J329 Chronic sinusitis, unspecified: Secondary | ICD-10-CM | POA: Diagnosis not present

## 2019-09-08 DIAGNOSIS — J40 Bronchitis, not specified as acute or chronic: Secondary | ICD-10-CM | POA: Diagnosis not present

## 2019-09-08 DIAGNOSIS — K529 Noninfective gastroenteritis and colitis, unspecified: Secondary | ICD-10-CM | POA: Diagnosis not present

## 2019-09-08 MED ORDER — PREDNISONE 10 MG PO TABS: ORAL_TABLET | ORAL | 0 refills | Status: DC

## 2019-09-08 MED ORDER — DIPHENOXYLATE-ATROPINE 2.5-0.025 MG PO TABS: 2.0000 | ORAL_TABLET | Freq: Four times a day (QID) | ORAL | 0 refills | Status: DC | PRN

## 2019-09-08 MED ORDER — LEVOFLOXACIN 500 MG PO TABS: 500.0000 mg | ORAL_TABLET | Freq: Every day | ORAL | 0 refills | Status: DC

## 2019-09-08 NOTE — Progress Notes (Signed)
Subjective:    Patient ID: Seth Mcmillan, male    DOB: Mar 25, 1965, 54 y.o.   MRN: [id number]   HPI: Seth Mcmillan is a 54 y.o. male presenting for onset 3 days ago with nausea, fever, malaise. The following day had fever and body aches. Went to [facility]. CoVID test was negative. Having HA as well. Today feels weak. Intermittent fever and diarrhea. Using tylenol. Feeling short of breath. Hard to talk due  to dyspnea today.    Depression screen [facility] 2/9 07/21/2019 07/06/2019 04/14/2018 02/25/2018 10/01/2017  Decreased Interest 0 0 0 0 0  Down, Depressed, Hopeless 0 0 0 0 0  PHQ - 2 Score 0 0 0 0 0  Altered sleeping - 0 0 - -  Tired, decreased energy - 1 0 - -  Change in appetite - 0 0 - -  Feeling bad or failure about yourself  - 0 0 - -  Trouble concentrating - 0 0 - -  Moving slowly or fidgety/restless - 0 0 - -  Suicidal thoughts - 0 0 - -  PHQ-9 Score - 1 0 - -     Relevant past medical, surgical, family and social history reviewed and updated as indicated.  Interim medical history since our last visit reviewed. Allergies and medications reviewed and updated.  ROS:  Review of Systems  Constitutional: Positive for activity change, chills, fatigue and fever.  HENT: Negative.   Respiratory: Positive for cough and shortness of breath.   Gastrointestinal: Positive for diarrhea, nausea and vomiting.  Musculoskeletal: Positive for myalgias.  Neurological: Positive for headaches.     Social History   Tobacco Use  Smoking Status Former Smoker  . Packs/day: 1.00  . Years: 25.00  . Pack years: 25.00  . Types: Cigarettes  . Quit date: 07/16/2017  . Years since quitting: 2.1  Smokeless Tobacco Never Used       Objective:     Wt Readings from Last 3 Encounters:  07/21/19 204 lb 9.6 oz (92.8 kg)  07/06/19 206 lb (93.4 kg)  05/21/19 190 lb (86.2 kg)     Exam deferred. Pt. Harboring due to COVID 19. Phone visit performed.   Assessment & Plan:   1. Sinobronchitis   2.  Gastroenteritis, acute     Meds ordered this encounter  Medications  . predniSONE (DELTASONE) 10 MG tablet    Sig: Take 5 daily for 2 days followed by 4,3,2 and 1 for 2 days each.    Dispense:  30 tablet    Refill:  0  . levofloxacin (LEVAQUIN) 500 MG tablet    Sig: Take 1 tablet (500 mg total) by mouth daily. For 10 days    Dispense:  10 tablet    Refill:  0  . diphenoxylate-atropine (LOMOTIL) 2.5-0.025 MG tablet    Sig: Take 2 tablets by mouth 4 (four) times daily as needed for diarrhea or loose stools.    Dispense:  30 tablet    Refill:  0    No orders of the defined types were placed in this encounter.     Diagnoses and all orders for this visit:  Sinobronchitis  Gastroenteritis, acute  Other orders -     predniSONE (DELTASONE) 10 MG tablet; Take 5 daily for 2 days followed by 4,3,2 and 1 for 2 days each. -     levofloxacin (LEVAQUIN) 500 MG tablet; Take 1 tablet (500 mg total) by mouth daily. For 10 days -  diphenoxylate-atropine (LOMOTIL) 2.5-0.025 MG tablet; Take 2 tablets by mouth 4 (four) times daily as needed for diarrhea or loose stools.    Virtual Visit via telephone Note  I discussed the limitations, risks, security and privacy concerns of performing an evaluation and management service by telephone and the availability of in person appointments. The patient was identified with two identifiers. Pt.expressed understanding and agreed to proceed. Pt. Is at home. Dr. Livia Snellen is in his office.  Follow Up Instructions:   I discussed the assessment and treatment plan with the patient. The patient was provided an opportunity to ask questions and all were answered. The patient agreed with the plan and demonstrated an understanding of the instructions.   The patient was advised to call back or seek an in-person evaluation if the symptoms worsen or if the condition fails to improve as anticipated.   Total minutes including chart review and phone contact time:  18   Follow up plan: Return if symptoms worsen or fail to improve. Seek help immediatly if shortness of breath becomes severe.  Claretta Fraise, MD [place]

## 2019-09-09 ENCOUNTER — Telehealth: Payer: Self-pay | Admitting: Family Medicine

## 2019-09-09 NOTE — Telephone Encounter (Signed)
Work note corrected and faxed

## 2019-09-10 ENCOUNTER — Telehealth: Payer: Self-pay | Admitting: Family Medicine

## 2019-09-10 NOTE — Telephone Encounter (Signed)
Work excuse letter was faxed to [phone], [employer].

## 2019-11-25 ENCOUNTER — Ambulatory Visit: Payer: 59 | Admitting: Nurse Practitioner

## 2020-01-04 ENCOUNTER — Ambulatory Visit: Payer: 59 | Admitting: Family Medicine

## 2020-02-09 ENCOUNTER — Ambulatory Visit: Payer: 59 | Admitting: Family Medicine

## 2020-03-13 ENCOUNTER — Ambulatory Visit: Payer: 59 | Admitting: Family Medicine

## 2020-03-15 ENCOUNTER — Telehealth: Payer: Self-pay

## 2020-03-15 NOTE — Telephone Encounter (Signed)
When did patient's symptoms start?  If <7 days ago and has not been vaccinated, he qualifies for [facility] clinic.  Regardless, Vit C, Vit D and Zinc. Hydration very important.  Recommend visit to assess hydration/ rx nausea meds if ongoing vomiting.

## 2020-03-15 NOTE — Telephone Encounter (Signed)
Pt called stating that he tested positive for COVID and wants to know what Dr Nadine Counts recommends for him to take OTC to help. Pt says his main symptoms are vomiting and diarrhea.

## 2020-03-16 NOTE — Telephone Encounter (Signed)
Patient aware and verbalized understanding. Patient had symptoms since 12/17. Patient has had both Vaccines he will call of [place] if he needs. States he feels better today.

## 2020-03-22 DIAGNOSIS — F1010 Alcohol abuse, uncomplicated: Secondary | ICD-10-CM | POA: Insufficient documentation

## 2020-03-22 DIAGNOSIS — E871 Hypo-osmolality and hyponatremia: Secondary | ICD-10-CM | POA: Insufficient documentation

## 2020-03-30 ENCOUNTER — Telehealth: Payer: Self-pay

## 2020-03-30 DIAGNOSIS — Z029 Encounter for administrative examinations, unspecified: Secondary | ICD-10-CM

## 2020-03-30 NOTE — Telephone Encounter (Signed)
appt made for 04/11/20, pt aware

## 2020-03-30 NOTE — Telephone Encounter (Signed)
LMTCB

## 2020-04-05 ENCOUNTER — Telehealth: Payer: Self-pay

## 2020-04-05 NOTE — Telephone Encounter (Signed)
Dr. Lajuana Ripple is going to speak to patient about his ppw

## 2020-04-05 NOTE — Telephone Encounter (Signed)
Short term disability forms

## 2020-04-05 NOTE — Telephone Encounter (Signed)
PPW faxed to [phone]

## 2020-04-11 ENCOUNTER — Ambulatory Visit: Payer: 59 | Admitting: Family Medicine

## 2020-04-11 ENCOUNTER — Other Ambulatory Visit: Payer: Self-pay

## 2020-04-11 ENCOUNTER — Encounter: Payer: Self-pay | Admitting: Family Medicine

## 2020-04-11 VITALS — BP 134/79 | HR 82 | Temp 97.6°F | Resp 20 | Ht 71.0 in | Wt 187.0 lb

## 2020-04-11 DIAGNOSIS — Z09 Encounter for follow-up examination after completed treatment for conditions other than malignant neoplasm: Secondary | ICD-10-CM

## 2020-04-11 DIAGNOSIS — R0781 Pleurodynia: Secondary | ICD-10-CM | POA: Diagnosis not present

## 2020-04-11 DIAGNOSIS — K852 Alcohol induced acute pancreatitis without necrosis or infection: Secondary | ICD-10-CM | POA: Diagnosis not present

## 2020-04-11 NOTE — Patient Instructions (Signed)
Rib Contusion A rib contusion is a deep bruise on the rib area. Contusions are the result of a blunt trauma that causes bleeding and injury to the tissues under the skin. A rib contusion may involve bruising of the ribs and of the skin and muscles in the area. The skin over the contusion may turn blue, purple, or yellow. Minor injuries result in a painless contusion. More severe contusions may be painful and swollen for a few weeks. What are the causes? This condition is usually caused by a hard, direct hit to an area of the body. This often occurs while playing contact sports. What are the signs or symptoms? Symptoms of this condition include:  Swelling and redness of the injured area.  Discoloration of the injured area.  Tenderness and soreness of the injured area.  Pain with or without movement.  Pain when breathing in. How is this diagnosed? This condition may be diagnosed based on:  Your symptoms and medical history.  A physical exam.  Imaging tests--such as an X-ray, CT scan, or MRI--to determine if there were internal injuries or broken bones (fractures). How is this treated? This condition may be treated with:  Rest. This is often the best treatment for a rib contusion.  Ice packs. This reduces swelling and inflammation.  Deep-breathing exercises. These may be recommended to reduce the risk for lung collapse and pneumonia.  Medicines. Over-the-counter or prescription medicines may be given to control pain.  Injection of a numbing medicine around the nerve near your injury (nerve block). Follow these instructions at home: Medicines  Take over-the-counter and prescription medicines only as told by your health care provider.  Ask your health care provider if the medicine prescribed to you: ? Requires you to avoid driving or using machinery. ? Can cause constipation. You may need to take these actions to prevent or treat constipation:  Drink enough fluid to keep your  urine pale yellow.  Take over-the-counter or prescription medicines.  Eat foods that are high in fiber, such as beans, whole grains, and fresh fruits and vegetables.  Limit foods that are high in fat and processed sugars, such as fried or sweet foods. Managing pain, stiffness, and swelling If directed, put ice on the injured area. To do this:  Put ice in a plastic bag.  Place a towel between your skin and the bag.  Leave the ice on for 20 minutes, 2-3 times a day.  Remove the ice if your skin turns bright red. This is very important. If you cannot feel pain, heat, or cold, you have a greater risk of damage to the area.   Activity  Rest the injured area.  Avoid strenuous activity and any activities or movements that cause pain. Be careful during activities, and avoid bumping the injured area.  Do not lift anything that is heavier than 5 lb (2.3 kg), or the limit that you are told, until your health care provider says that it is safe. General instructions  Do not use any products that contain nicotine or tobacco, such as cigarettes, e-cigarettes, and chewing tobacco. These can delay healing. If you need help quitting, ask your health care provider.  Do deep-breathing exercises as told by your health care provider.  If you were given an incentive spirometer, use it every 1-2 hours while you are awake, or as recommended by your health care provider. This device measures how well you are filling your lungs with each breath.  Keep all follow-up visits. This is  important.   Contact a health care provider if you have:  Increased bruising or swelling.  Pain that is not controlled with treatment.  A fever. Get help right away if you:  Have difficulty breathing or shortness of breath.  Develop a continual cough, or you cough up thick or bloody mucus from your lungs (sputum).  Feel nauseous or you vomit.  Have pain in your abdomen. These symptoms may represent a serious problem  that is an emergency. Do not wait to see if the symptoms will go away. Get medical help right away. Call your local emergency services (911 in the [place] Do not drive yourself to the hospital. Summary  A rib contusion is a deep bruise on your rib area. Contusions are the result of a blunt trauma that causes bleeding and injury to the tissues under the skin.  The skin over the contusion may turn blue, purple, or yellow. Minor injuries may cause a painless contusion. More severe contusions may be painful and swollen for a few weeks.  Rest the injured area. Avoid strenuous activity and any activities or movements that cause pain. This information is not intended to replace advice given to you by your health care provider. Make sure you discuss any questions you have with your health care provider. Document Revised: 06/09/2019 Document Reviewed: 06/09/2019 Elsevier Patient Education  [place].

## 2020-04-11 NOTE — Progress Notes (Signed)
Subjective: CC: Hospital follow-up for acute pancreatitis PCP: Seth Norlander, DO AYT:Seth Mcmillan is a 55 y.o. male presenting to clinic today for:  1.  Acute pancreatitis Patient was admitted to [facility] for acute pancreatitis, thought to be alcohol induced.  He does admit to drinking an average of 8-12 beers on the weekends.  He notes that the abdominal pain has totally resolved and that his appetite has picked back up and he is tolerating p.o. intake without difficulty.  He continues to drink on the weekends has not had recurrence of his symptoms.  Denies any nausea, vomiting, jaundice.  He is compliant with his statin, TriCor.  2.  Rib pain Patient does report that he sustained a fall on the ice last week.  He fell onto his left side.  He has subsequently had pain with certain movements and deep breathing on the left lateral rib.  No hemoptysis or shortness of breath.  Has not been checked out for this issue.   ROS: Per HPI  No Known Allergies Past Medical History:  Diagnosis Date  . Anxiety   . Arthritis   . Depression   . Fatty liver   . GERD (gastroesophageal reflux disease)   . Hyperlipidemia   . Hypertension   . IBS (irritable bowel syndrome)    Diarrhea type    Current Outpatient Medications:  .  amLODipine (NORVASC) 5 MG tablet, Take 1 tablet (5 mg total) by mouth daily., Disp: 90 tablet, Rfl: 3 .  Ascorbic Acid (VITAMIN C) 1000 MG tablet, Take 1,000 mg by mouth daily., Disp: , Rfl:  .  aspirin EC 81 MG tablet, Take 81 mg by mouth daily., Disp: , Rfl:  .  cholecalciferol (VITAMIN D3) 25 MCG (1000 UT) tablet, Take 1,000 Units by mouth daily., Disp: , Rfl:  .  diphenoxylate-atropine (LOMOTIL) 2.5-0.025 MG tablet, Take 2 tablets by mouth 4 (four) times daily as needed for diarrhea or loose stools., Disp: 30 tablet, Rfl: 0 .  escitalopram (LEXAPRO) 10 MG tablet, Take 1 tablet (10 mg total) by mouth daily., Disp: 90 tablet, Rfl: 3 .  levofloxacin (LEVAQUIN)  500 MG tablet, Take 1 tablet (500 mg total) by mouth daily. For 10 days, Disp: 10 tablet, Rfl: 0 .  omeprazole (PRILOSEC) 40 MG capsule, Take 1 capsule (40 mg total) by mouth daily., Disp: 90 capsule, Rfl: 1 .  predniSONE (DELTASONE) 10 MG tablet, Take 5 daily for 2 days followed by 4,3,2 and 1 for 2 days each., Disp: 30 tablet, Rfl: 0 .  rosuvastatin (CRESTOR) 40 MG tablet, Take 1 tablet (40 mg total) by mouth daily., Disp: 90 tablet, Rfl: 3 .  vitamin B-12 (CYANOCOBALAMIN) 1000 MCG tablet, Take 1,000 mcg by mouth daily., Disp: , Rfl:  .  vitamin E 400 UNIT capsule, Take 400 Units by mouth daily., Disp: , Rfl:  Social History   Socioeconomic History  . Marital status: Divorced    Spouse name: Not on file  . Number of children: Not on file  . Years of education: Not on file  . Highest education level: Not on file  Occupational History  . Not on file  Tobacco Use  . Smoking status: Former Smoker    Packs/day: 1.00    Years: 25.00    Pack years: 25.00    Types: Cigarettes    Quit date: 07/16/2017    Years since quitting: 2.7  . Smokeless tobacco: Never Used  Vaping Use  . Vaping Use: Never  used  Substance and Sexual Activity  . Alcohol use: Yes    Alcohol/week: 12.0 standard drinks    Types: 12 Cans of beer per week    Comment: 12 cans of beer/week, mostly on weekends.  . Drug use: No  . Sexual activity: Not on file  Other Topics Concern  . Not on file  Social History Narrative  . Not on file   Social Determinants of Health   Financial Resource Strain: Not on file  Food Insecurity: Not on file  Transportation Needs: Not on file  Physical Activity: Not on file  Stress: Not on file  Social Connections: Not on file  Intimate Partner Violence: Not on file   Family History  Problem Relation Age of Onset  . Heart disease Mother   . Dementia Mother   . Heart disease Father   . Diabetes Brother   . Colon cancer Neg Hx     Objective: Office vital signs reviewed. BP  134/79   Pulse 82   Temp 97.6 F (36.4 C)   Resp 20   Ht _0  (1.803 m)   Wt 187 lb (84.8 kg)   SpO2 99%   BMI 26.08 kg/m   Physical Examination:  General: Awake, alert, well nourished, No acute distress HEENT: Normal, sclera white, MMM Cardio: regular rate and rhythm, S1S2 heard, no murmurs appreciated Pulm: clear to auscultation bilaterally, no wheezes, rhonchi or rales; normal work of breathing on room air GI: Nontender nondistended Ribs: Tenderness palpation along the left right seventh rib angle laterally.  No palpable deformity Neuro: No tremor  Assessment/ Plan: 55 y.o. male   Hospital discharge follow-up  Alcohol-induced acute pancreatitis, unspecified complication status - Plan: CMP14+EGFR, CBC, CANCELED: CMP14+EGFR, CANCELED: CBC  Rib pain on left side - Plan: DG Ribs Unilateral W/Chest Left  I reviewed his admission note and discharge summary in the EMR.  He is apparently discharged on atorvastatin and fenofibrate but did not know that he was visit discontinue his rosuvastatin.  There is no indication that rosuvastatin was even noticed by the hospital system at that time.  He would like to continue rosuvastatin I think that is fine but I advised him to get rid of the atorvastatin as he should not be on to statins.  Okay to continue TriCor.  Agree with folic acid and thiamine, particularly given ongoing alcohol use.  I did advise him against any ongoing alcohol use as he does risk pancreatitis and other complications.  With regards to his fall, he does have tenderness palpation along the left lateral ribs.  Plan for plain films.  We would have attempted to take these today but unfortunately the x-ray was closed due to Covid.  We will have him come back tomorrow morning to get these and his labs performed.   No orders of the defined types were placed in this encounter.  No orders of the defined types were placed in this encounter.    Seth Norlander, DO [place] [phone]

## 2020-04-12 ENCOUNTER — Ambulatory Visit (INDEPENDENT_AMBULATORY_CARE_PROVIDER_SITE_OTHER): Payer: 59

## 2020-04-12 ENCOUNTER — Telehealth: Payer: Self-pay

## 2020-04-12 DIAGNOSIS — R0781 Pleurodynia: Secondary | ICD-10-CM

## 2020-04-12 LAB — CBC
Hematocrit: 40.4 % (ref 37.5–51.0)
Hemoglobin: 13.2 g/dL (ref 13.0–17.7)
MCH: 30 pg (ref 26.6–33.0)
MCHC: 32.7 g/dL (ref 31.5–35.7)
MCV: 92 fL (ref 79–97)
Platelets: 364 10*3/uL (ref 150–450)
RBC: 4.4 x10E6/uL (ref 4.14–5.80)
RDW: 12.9 % (ref 11.6–15.4)
WBC: 8.7 10*3/uL (ref 3.4–10.8)

## 2020-04-12 LAB — CMP14+EGFR
ALT: 9 IU/L (ref 0–44)
AST: 21 IU/L (ref 0–40)
Albumin/Globulin Ratio: 1.7 (ref 1.2–2.2)
Albumin: 4 g/dL (ref 3.8–4.9)
Alkaline Phosphatase: 103 IU/L (ref 44–121)
BUN/Creatinine Ratio: 21 — ABNORMAL HIGH (ref 9–20)
BUN: 15 mg/dL (ref 6–24)
Bilirubin Total: 0.6 mg/dL (ref 0.0–1.2)
CO2: 28 mmol/L (ref 20–29)
Calcium: 9.5 mg/dL (ref 8.7–10.2)
Chloride: 101 mmol/L (ref 96–106)
Creatinine, Ser: 0.73 mg/dL — ABNORMAL LOW (ref 0.76–1.27)
GFR calc Af Amer: 120 mL/min/{1.73_m2} (ref >59)
GFR calc non Af Amer: 104 mL/min/{1.73_m2} (ref >59)
Globulin, Total: 2.4 g/dL (ref 1.5–4.5)
Glucose: 94 mg/dL (ref 65–99)
Potassium: 3.9 mmol/L (ref 3.5–5.2)
Sodium: 144 mmol/L (ref 134–144)
Total Protein: 6.4 g/dL (ref 6.0–8.5)

## 2020-04-12 NOTE — Addendum Note (Signed)
Addended by: Liliane Bade on: 04/12/2020 08:23 AM   Modules accepted: Orders

## 2020-04-12 NOTE — Telephone Encounter (Signed)
Pt returned missed call from [facility] regarding FMLA paperwork. Pt wants Tye [place] to call him back when she is available.

## 2020-04-25 ENCOUNTER — Encounter: Payer: Self-pay | Admitting: Family Medicine

## 2020-04-25 ENCOUNTER — Other Ambulatory Visit: Payer: Self-pay

## 2020-04-25 ENCOUNTER — Ambulatory Visit: Payer: 59 | Admitting: Family Medicine

## 2020-04-25 VITALS — BP 131/76 | HR 67 | Temp 98.4°F | Ht 71.0 in | Wt 191.8 lb

## 2020-04-25 DIAGNOSIS — I10 Essential (primary) hypertension: Secondary | ICD-10-CM

## 2020-04-25 DIAGNOSIS — R0781 Pleurodynia: Secondary | ICD-10-CM | POA: Diagnosis not present

## 2020-04-25 DIAGNOSIS — F4322 Adjustment disorder with anxiety: Secondary | ICD-10-CM | POA: Diagnosis not present

## 2020-04-25 DIAGNOSIS — E782 Mixed hyperlipidemia: Secondary | ICD-10-CM

## 2020-04-25 DIAGNOSIS — K852 Alcohol induced acute pancreatitis without necrosis or infection: Secondary | ICD-10-CM

## 2020-04-25 DIAGNOSIS — K219 Gastro-esophageal reflux disease without esophagitis: Secondary | ICD-10-CM

## 2020-04-25 MED ORDER — AMLODIPINE BESYLATE 5 MG PO TABS: 5.0000 mg | ORAL_TABLET | Freq: Every day | ORAL | 3 refills | Status: DC

## 2020-04-25 MED ORDER — LIDOCAINE 5 % EX PTCH
1.0000 | MEDICATED_PATCH | Freq: Every day | CUTANEOUS | 99 refills | Status: DC | PRN
Start: 2020-04-25 — End: 2021-05-31

## 2020-04-25 MED ORDER — ROSUVASTATIN CALCIUM 40 MG PO TABS: 40.0000 mg | ORAL_TABLET | Freq: Every day | ORAL | 3 refills | Status: DC

## 2020-04-25 MED ORDER — OMEPRAZOLE 40 MG PO CPDR: 40.0000 mg | DELAYED_RELEASE_CAPSULE | Freq: Every day | ORAL | 1 refills | Status: DC

## 2020-04-25 MED ORDER — ESCITALOPRAM OXALATE 10 MG PO TABS: 10.0000 mg | ORAL_TABLET | Freq: Every day | ORAL | 3 refills | Status: DC

## 2020-04-25 NOTE — Patient Instructions (Signed)
We discussed the risks of alcohol on recurrent pancreatitis.  I do not expect that you have another flare as long as you are not drinking.  With each flare of pancreatitis you run the risk of chronic pancreatitis.  Should you have another flare, we will need to revisit your FMLA and make sure that you are seeing a specialist for ongoing care and management.  With regards to your rib pain, I have prescribed lidocaine patches.  You may apply these daily, leave on for 12 hours and then remove.  Use this as long as you need to.

## 2020-04-25 NOTE — Progress Notes (Signed)
Subjective: CC: Follow-up chronic conditions including hypertension, hyperlipidemia and anxiety disorder PCP: [name], DO Seth Mcmillan is a 55 y.o. male presenting to clinic today for:  1.  Hypertension with hyperlipidemia/history of acute pancreatitis Patient is compliant with his Norvasc, TriCor and Crestor.  No abdominal pain, nausea, vomiting since pancreatitis attack.  He continues to indulge in alcohol on the weekends sometimes.  He has not had recurrent pancreatitis attacks since his hospitalization in January.  Apparently his work has not received his FMLA paperwork and he is asking about this today  He is asking if he should continue folic acid and thiamine  2.  Anxiety disorder Patient is compliant with Lexapro 10 mg daily.  Denies any symptoms of depression or anxiety  3.  Rib pain Patient continues to have some left-sided rib pain that seems to be worse after doing certain physical activities.  He had negative x-ray for fracture.  Symptoms do seem to be getting better but are still bothersome towards the end of the day.  No hemoptysis.   ROS: Per HPI  No Known Allergies Past Medical History:  Diagnosis Date  . Anxiety   . Arthritis   . Depression   . Fatty liver   . GERD (gastroesophageal reflux disease)   . Hyperlipidemia   . Hypertension   . IBS (irritable bowel syndrome)    Diarrhea type    Current Outpatient Medications:  .  amLODipine (NORVASC) 5 MG tablet, Take 1 tablet (5 mg total) by mouth daily., Disp: 90 tablet, Rfl: 3 .  Ascorbic Acid (VITAMIN C) 1000 MG tablet, Take 1,000 mg by mouth daily., Disp: , Rfl:  .  aspirin EC 81 MG tablet, Take 81 mg by mouth daily., Disp: , Rfl:  .  cholecalciferol (VITAMIN D3) 25 MCG (1000 UT) tablet, Take 1,000 Units by mouth daily., Disp: , Rfl:  .  escitalopram (LEXAPRO) 10 MG tablet, Take 1 tablet (10 mg total) by mouth daily., Disp: 90 tablet, Rfl: 3 .  fenofibrate (TRICOR) 48 MG tablet, Take 48 mg by  mouth daily., Disp: , Rfl:  .  folic acid (FOLVITE) 1 MG tablet, Take 1 mg by mouth daily., Disp: , Rfl:  .  omeprazole (PRILOSEC) 40 MG capsule, Take 1 capsule (40 mg total) by mouth daily., Disp: 90 capsule, Rfl: 1 .  rosuvastatin (CRESTOR) 40 MG tablet, Take 1 tablet (40 mg total) by mouth daily., Disp: 90 tablet, Rfl: 3 Social History   Socioeconomic History  . Marital status: Divorced    Spouse name: Not on file  . Number of children: Not on file  . Years of education: Not on file  . Highest education level: Not on file  Occupational History  . Not on file  Tobacco Use  . Smoking status: Former Smoker    Packs/day: 1.00    Years: 25.00    Pack years: 25.00    Types: Cigarettes    Quit date: 07/16/2017    Years since quitting: 2.7  . Smokeless tobacco: Never Used  Vaping Use  . Vaping Use: Never used  Substance and Sexual Activity  . Alcohol use: Yes    Alcohol/week: 12.0 standard drinks    Types: 12 Cans of beer per week    Comment: 12 cans of beer/week, mostly on weekends.  . Drug use: No  . Sexual activity: Not on file  Other Topics Concern  . Not on file  Social History Narrative  . Not on file  Social Determinants of Health   Financial Resource Strain: Not on file  Food Insecurity: Not on file  Transportation Needs: Not on file  Physical Activity: Not on file  Stress: Not on file  Social Connections: Not on file  Intimate Partner Violence: Not on file   Family History  Problem Relation Age of Onset  . Heart disease Mother   . Dementia Mother   . Heart disease Father   . Diabetes Brother   . Colon cancer Neg Hx     Objective: Office vital signs reviewed. BP 131/76   Pulse 67   Temp 98.4 F (36.9 C)   Ht 5\' 11"  (1.803 m)   Wt 191 lb 12.8 oz (87 kg)   SpO2 97%   BMI 26.75 kg/m   Physical Examination:  General: Awake, alert, well nourished, No acute distress HEENT: Normal, sclera white Cardio: regular rate and rhythm, S1S2 heard, no murmurs  appreciated Pulm: clear to auscultation bilaterally, no wheezes, rhonchi or rales; normal work of breathing on room air Left ribs: Pain not reproducible on exam.  No palpable deformities Extremities: warm, well perfused, No edema, cyanosis or clubbing; +2 pulses bilaterally  Assessment/ Plan: 54 y.o. male   Rib pain on left side - Plan: lidocaine (LIDODERM) 5 %  Mixed hyperlipidemia - Plan: rosuvastatin (CRESTOR) 40 MG tablet  Essential hypertension - Plan: amLODipine (NORVASC) 5 MG tablet  Adjustment disorder with anxious mood - Plan: escitalopram (LEXAPRO) 10 MG tablet  Gastroesophageal reflux disease without esophagitis - Plan: omeprazole (PRILOSEC) 40 MG capsule  Alcohol-induced acute pancreatitis without infection or necrosis - Plan: Folate  Trial of Lidoderm patches.  Discussed use.  No red flags to suggest fracture.  Continue statin, fenofibrate  Blood pressures controlled.  Continue current regimen  Mood is stable.  Continue Lexapro  GERD was not discussed but he needed refills on omeprazole  His acute pancreatitis has resolved.  I do not anticipate further flares if he is to avoid alcohol.  We discussed at length now the risk of recurrent pancreatitis with ongoing alcohol use, fatty foods, etc. should he have recurrence, low threshold to refer to gastroenterology for further management.  At that point would need to revisit his FMLA as he would certainly need more frequent visits to the specialist.  For now, no significant time will need to be taken off going forward in the absence of recurrence.   No orders of the defined types were placed in this encounter.  No orders of the defined types were placed in this encounter.    [name], DO [place] [phone]

## 2020-04-26 LAB — FOLATE: Folate: >20 ng/mL (ref >3.0)

## 2020-04-26 NOTE — Telephone Encounter (Signed)
Have touched base with patient a couple of times over past weeks, this has been completed

## 2020-05-19 ENCOUNTER — Other Ambulatory Visit: Payer: Self-pay | Admitting: *Deleted

## 2020-05-19 MED ORDER — FENOFIBRATE 48 MG PO TABS: 48.0000 mg | ORAL_TABLET | Freq: Every day | ORAL | 1 refills | Status: DC

## 2020-07-31 ENCOUNTER — Telehealth: Payer: Self-pay

## 2020-07-31 NOTE — Telephone Encounter (Signed)
Pt left a voicemail stating he hasn't been here in awhile and needs an appt to be seen and he missed the whole weekend out of work and wants FMLA paperwork filled out. States he will pay the cost for this.

## 2020-07-31 NOTE — Telephone Encounter (Signed)
Pt has not been seen since 03/2019. He will need to be seen in the office prior to being able to fill out FMLA forms. Please let him know.  Manuela Schwartz, please schedule ov.

## 2020-07-31 NOTE — Telephone Encounter (Signed)
Phoned and LM on vm for the pt to call our office for an appt because he has to be seen first before we can fill out the paperwork

## 2020-08-03 NOTE — Telephone Encounter (Signed)
OV made, appt card mailed. Pt aware

## 2020-08-23 ENCOUNTER — Other Ambulatory Visit: Payer: Self-pay | Admitting: Family Medicine

## 2020-09-13 ENCOUNTER — Ambulatory Visit (INDEPENDENT_AMBULATORY_CARE_PROVIDER_SITE_OTHER): Payer: 59 | Admitting: Internal Medicine

## 2020-09-13 ENCOUNTER — Encounter: Payer: Self-pay | Admitting: Internal Medicine

## 2020-09-13 ENCOUNTER — Other Ambulatory Visit: Payer: Self-pay

## 2020-09-13 VITALS — BP 160/90 | HR 78 | Temp 97.3°F | Ht 71.0 in | Wt 196.6 lb

## 2020-09-13 DIAGNOSIS — K580 Irritable bowel syndrome with diarrhea: Secondary | ICD-10-CM | POA: Diagnosis not present

## 2020-09-13 DIAGNOSIS — R110 Nausea: Secondary | ICD-10-CM

## 2020-09-13 DIAGNOSIS — K219 Gastro-esophageal reflux disease without esophagitis: Secondary | ICD-10-CM

## 2020-09-13 MED ORDER — ONDANSETRON HCL 4 MG PO TABS: 4.0000 mg | ORAL_TABLET | Freq: Three times a day (TID) | ORAL | 2 refills | Status: DC | PRN

## 2020-09-13 MED ORDER — RIFAXIMIN 550 MG PO TABS: 550.0000 mg | ORAL_TABLET | Freq: Two times a day (BID) | ORAL | 0 refills | Status: AC

## 2020-09-13 NOTE — Patient Instructions (Signed)
For your irritable bowel syndrome and diarrhea, I recommend taking Imodium as needed.  I will send in a 2-week course of rifaximin that she needs to take twice daily.  For your associated nausea I will send in prescription for ondansetron to take as needed.  We will work on your FMLA paperwork and fax it to your office when completed.  Follow-up in 3 months  At [facility] we value your feedback. You may receive a survey about your visit today. Please share your experience as we strive to create trusting relationships with our patients to provide genuine, compassionate, quality care.  We appreciate your understanding and patience as we review any laboratory studies, imaging, and other diagnostic tests that are ordered as we care for you. Our office policy is 5 business days for review of these results, and any emergent or urgent results are addressed in a timely manner for your best interest. If you do not hear from our office in 1 week, please contact us.   We also encourage the use of MyChart, which contains your medical information for your review as well. If you are not enrolled in this feature, an access code is on this after visit summary for your convenience. Thank you for allowing [place] to be involved in your care.  It was great to see you today!  I hope you have a great rest of your summer!!    Elon Alas. Abbey Chatters, D.O. Gastroenterology and Hepatology [facility]

## 2020-09-14 ENCOUNTER — Telehealth: Payer: Self-pay | Admitting: Internal Medicine

## 2020-09-14 NOTE — Telephone Encounter (Signed)
Pt called this afternoon to say that he was seen by Dr Abbey Chatters yesterday and brought FMLA papers with him and wanted to know the status of that. He is needing them ASAP. Please advise. [phone]

## 2020-09-14 NOTE — Telephone Encounter (Signed)
Forms completed, code entered, copies made for scanning, pt aware of $29 fee and to pick up before 1130 am tomorrow.

## 2020-09-14 NOTE — Telephone Encounter (Signed)
noted 

## 2020-09-15 ENCOUNTER — Encounter: Payer: Self-pay | Admitting: *Deleted

## 2020-09-20 NOTE — Progress Notes (Signed)
Referring Provider: [name], DO Primary Care Physician:  [name], DO Primary GI:  Dr. Abbey Chatters  Chief Complaint  Patient presents with   Diarrhea    Wants FMLA filled out   Nausea    No vomiting    HPI:   Seth Mcmillan is a 55 y.o. male who presents to clinic today for follow-up visit.  He has a history of irritable bowel syndrome diarrhea predominant.  Currently having 6-7 loose bowel movements daily.  States he has had to miss multiple days of work due to symptoms.  Also with associated nausea.  Colonoscopy 05/25/2019 unremarkable with a 5-year recall due to surveillance.  EGD at the same time showed mild gastritis, biopsies negative for H. pylori.  Did have a Schatzki's ring.  Has taken Xifaxan in the past which she states helps a lot with his symptoms.  Interested in undergoing another course of this.  Past Medical History:  Diagnosis Date   Anxiety    Arthritis    Depression    Fatty liver    GERD (gastroesophageal reflux disease)    Hyperlipidemia    Hypertension    IBS (irritable bowel syndrome)    Diarrhea type    Past Surgical History:  Procedure Laterality Date   ANTERIOR CERVICAL DECOMP/DISCECTOMY FUSION N/A 05/18/2018   Procedure: C6-7. C7-T1 ANTERIOR CERVICAL DECOMPRESSION/DISCECTOMY FUSION, ALLOGRAFT, PLATE;  Surgeon: Marybelle Killings, MD;  Location: [place];  Service: Orthopedics;  Laterality: N/A;   BIOPSY  09/05/2015   Procedure: BIOPSY;  Surgeon: Danie Binder, MD;  [facility];  Service: Endoscopy;;  random colon bx   BIOPSY  05/25/2019   Procedure: BIOPSY;  Surgeon: Danie Binder, MD;  [facility];  Service: Endoscopy;;   COLONOSCOPY N/A 09/05/2015   Dr. Oneida Alar: 4 simple adenomas removed.  Next colonoscopy 3 years.   COLONOSCOPY WITH PROPOFOL N/A 05/25/2019   Procedure: COLONOSCOPY WITH PROPOFOL;  Surgeon: Danie Binder, MD;  [facility];  Service: Endoscopy;  Laterality: N/A;  12:15pm    ESOPHAGOGASTRODUODENOSCOPY (EGD) WITH PROPOFOL N/A 05/25/2019   Procedure: ESOPHAGOGASTRODUODENOSCOPY (EGD) WITH PROPOFOL;  Surgeon: Danie Binder, MD;  [facility];  Service: Endoscopy;  Laterality: N/A;   KNEE SURGERY Right 1997   bursa sac   POLYPECTOMY  09/05/2015   Procedure: POLYPECTOMY;  Surgeon: Danie Binder, MD;  [facility];  Service: Endoscopy;;  colon polyps   SHOULDER ARTHROSCOPY WITH BICEPS TENDON REPAIR Right 2011   TONSILECTOMY, ADENOIDECTOMY, BILATERAL MYRINGOTOMY AND TUBES      Current Outpatient Medications  Medication Sig Dispense Refill   amLODipine (NORVASC) 5 MG tablet Take 1 tablet (5 mg total) by mouth daily. 90 tablet 3   Ascorbic Acid (VITAMIN C) 1000 MG tablet Take 1,000 mg by mouth daily.     aspirin EC 81 MG tablet Take 81 mg by mouth daily.     cholecalciferol (VITAMIN D3) 25 MCG (1000 UT) tablet Take 1,000 Units by mouth daily.     escitalopram (LEXAPRO) 10 MG tablet Take 1 tablet (10 mg total) by mouth daily. 90 tablet 3   fenofibrate (TRICOR) 48 MG tablet Take 1 tablet by mouth once daily 30 tablet 1   omeprazole (PRILOSEC) 40 MG capsule Take 1 capsule (40 mg total) by mouth daily. 90 capsule 1   ondansetron (ZOFRAN) 4 MG tablet Take 1 tablet (4 mg total) by mouth every 8 (eight) hours as  needed for nausea or vomiting. 30 tablet 2   rifaximin (XIFAXAN) 550 MG TABS tablet Take 1 tablet (550 mg total) by mouth 2 (two) times daily for 14 days. 28 tablet 0   rosuvastatin (CRESTOR) 40 MG tablet Take 1 tablet (40 mg total) by mouth daily. 90 tablet 3   VITAMIN E PO Take by mouth daily.     folic acid (FOLVITE) 1 MG tablet Take 1 mg by mouth daily. (Patient not taking: Reported on 09/13/2020)     lidocaine (LIDODERM) 5 % Place 1 patch onto the skin daily as needed (rib pain). Remove & Discard patch within 12 hours or as directed by MD (Patient not taking: Reported on 09/13/2020) 30 patch prn   No current facility-administered medications for  this visit.    Allergies as of 09/13/2020   (No Known Allergies)    Family History  Problem Relation Age of Onset   Heart disease Mother    Dementia Mother    Heart disease Father    Diabetes Brother    Colon cancer Neg Hx     Social History   Socioeconomic History   Marital status: Divorced    Spouse name: Not on file   Number of children: Not on file   Years of education: Not on file   Highest education level: Not on file  Occupational History   Not on file  Tobacco Use   Smoking status: Former    Packs/day: 1.00    Years: 25.00    Pack years: 25.00    Types: Cigarettes    Quit date: 07/16/2017    Years since quitting: 3.1   Smokeless tobacco: Never  Vaping Use   Vaping Use: Never used  Substance and Sexual Activity   Alcohol use: Yes    Alcohol/week: 12.0 standard drinks    Types: 12 Cans of beer per week    Comment: 12 cans of beer/week, mostly on weekends.   Drug use: No   Sexual activity: Not on file  Other Topics Concern   Not on file  Social History Narrative   Not on file   Social Determinants of Health   Financial Resource Strain: Not on file  Food Insecurity: Not on file  Transportation Needs: Not on file  Physical Activity: Not on file  Stress: Not on file  Social Connections: Not on file    Subjective: Review of Systems  Constitutional:  Negative for chills and fever.  HENT:  Negative for congestion and hearing loss.   Eyes:  Negative for blurred vision and double vision.  Respiratory:  Negative for cough and shortness of breath.   Cardiovascular:  Negative for chest pain and palpitations.  Gastrointestinal:  Positive for diarrhea and nausea. Negative for abdominal pain, blood in stool, constipation, heartburn, melena and vomiting.  Genitourinary:  Negative for dysuria and urgency.  Musculoskeletal:  Negative for joint pain and myalgias.  Skin:  Negative for itching and rash.  Neurological:  Negative for dizziness and headaches.   Psychiatric/Behavioral:  Negative for depression. The patient is not nervous/anxious.     Objective: BP (!) 160/90   Pulse 78   Temp (!) 97.3 F (36.3 C) (Temporal)   Ht 5\' 11"  (1.803 m)   Wt 196 lb 9.6 oz (89.2 kg)   BMI 27.42 kg/m  Physical Exam Constitutional:      Appearance: Normal appearance.  HENT:     Head: Normocephalic and atraumatic.  Eyes:     Extraocular Movements: Extraocular  movements intact.     Conjunctiva/sclera: Conjunctivae normal.  Cardiovascular:     Rate and Rhythm: Normal rate and regular rhythm.  Pulmonary:     Effort: Pulmonary effort is normal.     Breath sounds: Normal breath sounds.  Abdominal:     General: Bowel sounds are normal.     Palpations: Abdomen is soft.  Musculoskeletal:        General: Normal range of motion.     Cervical back: Normal range of motion and neck supple.  Skin:    General: Skin is warm.  Neurological:     General: No focal deficit present.     Mental Status: He is alert and oriented to person, place, and time.  Psychiatric:        Mood and Affect: Mood normal.        Behavior: Behavior normal.     Assessment: *Irritable bowel syndrome-diarrhea predominant *Nausea   Plan: For patient's IBS with diarrhea, I will send in a 2-week course of Xifaxan.  Also recommended that he take Imodium as needed on top of this.  I will send in as needed ondansetron for his nausea.  If this worsens we may need to consider repeat endoscopy.  If diarrhea continues we may need to consider stool pathogen's though infectious etiology less likely given the chronic nature of his symptoms.  FMLA paperwork filled out.  Follow-up in 3 months.  09/20/2020 9:21 AM   Disclaimer: This note was dictated with voice recognition software. Similar sounding words can inadvertently be transcribed and may not be corrected upon review.

## 2020-10-23 ENCOUNTER — Ambulatory Visit: Payer: 59 | Admitting: Family Medicine

## 2020-11-07 ENCOUNTER — Ambulatory Visit: Payer: 59 | Admitting: Family Medicine

## 2020-11-14 DIAGNOSIS — M79644 Pain in right finger(s): Secondary | ICD-10-CM | POA: Insufficient documentation

## 2020-11-15 DIAGNOSIS — M189 Osteoarthritis of first carpometacarpal joint, unspecified: Secondary | ICD-10-CM | POA: Insufficient documentation

## 2020-11-16 ENCOUNTER — Encounter: Payer: Self-pay | Admitting: Internal Medicine

## 2020-11-24 ENCOUNTER — Other Ambulatory Visit: Payer: Self-pay | Admitting: Family Medicine

## 2020-11-24 DIAGNOSIS — K219 Gastro-esophageal reflux disease without esophagitis: Secondary | ICD-10-CM

## 2020-12-08 ENCOUNTER — Ambulatory Visit: Payer: 59 | Admitting: Gastroenterology

## 2020-12-27 ENCOUNTER — Ambulatory Visit: Payer: 59 | Admitting: Family Medicine

## 2020-12-27 ENCOUNTER — Other Ambulatory Visit: Payer: Self-pay | Admitting: Family Medicine

## 2020-12-27 DIAGNOSIS — K219 Gastro-esophageal reflux disease without esophagitis: Secondary | ICD-10-CM

## 2021-01-02 ENCOUNTER — Ambulatory Visit: Payer: 59 | Admitting: Gastroenterology

## 2021-01-11 ENCOUNTER — Ambulatory Visit: Payer: 59 | Admitting: Internal Medicine

## 2021-02-20 ENCOUNTER — Encounter: Payer: Self-pay | Admitting: Family Medicine

## 2021-02-20 ENCOUNTER — Ambulatory Visit: Payer: 59 | Admitting: Family Medicine

## 2021-02-20 VITALS — BP 138/88 | HR 77 | Temp 98.5°F | Ht 71.0 in | Wt 191.4 lb

## 2021-02-20 DIAGNOSIS — E782 Mixed hyperlipidemia: Secondary | ICD-10-CM

## 2021-02-20 DIAGNOSIS — M19042 Primary osteoarthritis, left hand: Secondary | ICD-10-CM

## 2021-02-20 MED ORDER — FENOFIBRATE 48 MG PO TABS: 48.0000 mg | ORAL_TABLET | Freq: Every day | ORAL | 3 refills | Status: DC

## 2021-02-20 MED ORDER — CELECOXIB 100 MG PO CAPS: 100.0000 mg | ORAL_CAPSULE | Freq: Two times a day (BID) | ORAL | 1 refills | Status: DC | PRN

## 2021-02-20 NOTE — Patient Instructions (Signed)
You have prescribed a nonsteroidal anti-inflammatory drug (NSAID) today. This will help with your pain and inflammation. Please do not take any other NSAIDs (ibuprofen/Motrin/Advil, naproxen/Aleve, meloxicam/Mobic, Voltaren/diclofenac). Please make sure to eat a meal when taking this medication.   Caution: If you have a history of acid reflux/indigestion, I recommend that you take an antacid (such as Prilosec, Prevacid) daily while on the NSAID. If you have a history of bleeding disorder, gastric ulcer, are on a blood thinner (like warfarin/Coumadin, Xarelto, Eliquis, etc) please do not take NSAID. If you have ever had a heart attack, you should not take NSAIDs.

## 2021-02-20 NOTE — Progress Notes (Signed)
Subjective: CC: Hyperlipidemia PCP: [name], DO GEX:BMWU H Mcmillan is a 55 y.o. male presenting to clinic today for:  1.  Hyperlipidemia Patient is compliant with Crestor and Tricor.  Needs refill on the Tricor.  No chest pain, shortness of breath, dizziness, headache, edema  2.  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  He is left-hand dominant.  He is followed by a specialist in [employer] who was provided corticosteroid injections to these MCPs in the past.  He was told that there was almost no cartilage remaining in the left.  He anticipates that he may need surgical intervention at some point.  Using various OTC medications but nothing really helps.  He continues to work pretty hard with his hands   ROS: Per HPI  No Known Allergies Past Medical History:  Diagnosis Date   Anxiety    Arthritis    Depression    Fatty liver    GERD (gastroesophageal reflux disease)    Hyperlipidemia    Hypertension    IBS (irritable bowel syndrome)    Diarrhea type    Current Outpatient Medications:    amLODipine (NORVASC) 5 MG tablet, Take 1 tablet (5 mg total) by mouth daily., Disp: 90 tablet, Rfl: 3   Ascorbic Acid (VITAMIN C) 1000 MG tablet, Take 1,000 mg by mouth daily., Disp: , Rfl:    aspirin EC 81 MG tablet, Take 81 mg by mouth daily., Disp: , Rfl:    celecoxib (CELEBREX) 100 MG capsule, Take 1 capsule (100 mg total) by mouth 2 (two) times daily as needed for moderate pain., Disp: 180 capsule, Rfl: 1   cholecalciferol (VITAMIN D3) 25 MCG (1000 UT) tablet, Take 1,000 Units by mouth daily., Disp: , Rfl:    escitalopram (LEXAPRO) 10 MG tablet, Take 1 tablet (10 mg total) by mouth daily., Disp: 90 tablet, Rfl: 3   omeprazole (PRILOSEC) 40 MG capsule, Take 1 capsule by mouth once daily, Disp: 30 capsule, Rfl: 1   ondansetron (ZOFRAN) 4 MG tablet, Take 1 tablet (4 mg total) by mouth every 8 (eight) hours as needed for  nausea or vomiting., Disp: 30 tablet, Rfl: 2   rosuvastatin (CRESTOR) 40 MG tablet, Take 1 tablet (40 mg total) by mouth daily., Disp: 90 tablet, Rfl: 3   VITAMIN E PO, Take by mouth daily., Disp: , Rfl:    fenofibrate (TRICOR) 48 MG tablet, Take 1 tablet (48 mg total) by mouth daily., Disp: 90 tablet, Rfl: 3   folic acid (FOLVITE) 1 MG tablet, Take 1 mg by mouth daily. (Patient not taking: Reported on 02/20/2021), Disp: , Rfl:    lidocaine (LIDODERM) 5 %, Place 1 patch onto the skin daily as needed (rib pain). Remove & Discard patch within 12 hours or as directed by MD (Patient not taking: Reported on 02/20/2021), Disp: 30 patch, Rfl: prn Social History   Socioeconomic History   Marital status: Divorced    Spouse name: Not on file   Number of children: Not on file   Years of education: Not on file   Highest education level: Not on file  Occupational History   Not on file  Tobacco Use   Smoking status: Former    Packs/day: 1.00    Years: 25.00    Pack years: 25.00    Types: Cigarettes    Quit date: 07/16/2017    Years since quitting: 3.6   Smokeless tobacco: Never  Vaping Use  Vaping Use: Never used  Substance and Sexual Activity   Alcohol use: Yes    Alcohol/week: 12.0 standard drinks    Types: 12 Cans of beer per week    Comment: 12 cans of beer/week, mostly on weekends.   Drug use: No   Sexual activity: Not on file  Other Topics Concern   Not on file  Social History Narrative   Not on file   Social Determinants of Health   Financial Resource Strain: Not on file  Food Insecurity: Not on file  Transportation Needs: Not on file  Physical Activity: Not on file  Stress: Not on file  Social Connections: Not on file  Intimate Partner Violence: Not on file   Family History  Problem Relation Age of Onset   Heart disease Mother    Dementia Mother    Heart disease Father    Diabetes Brother    Colon cancer Neg Hx     Objective: Office vital signs reviewed. BP 138/88    Pulse 77   Temp 98.5 F (36.9 C)   Ht 5\' 11"  (1.803 m)   Wt 191 lb 6.4 oz (86.8 kg)   SpO2 96%   BMI 26.69 kg/m   Physical Examination:  General: Awake, alert, well nourished, No acute distress HEENT: Normal; sclera white Cardio: regular rate and rhythm, S1S2 heard, no murmurs appreciated Pulm: clear to auscultation bilaterally, no wheezes, rhonchi or rales; normal work of breathing on room air MSK: Arthritic changes noted through the MCPs of bilateral hands but most prominent at bilateral thumbs  Assessment/ Plan: 55 y.o. 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  He will plan for fasting lipid panel at next visit.  For his osteoarthritis, this is primarily managed by his orthopedist with corticosteroid injections but we will try some Celebrex and see if perhaps this might help maintain his chronic arthritis a little better.  Cautioned GI upset and I recommended that he continue PPI.  Advised against use of OTC NSAIDs while taking.  No orders of the defined types were placed in this encounter.  Meds ordered this encounter  Medications   fenofibrate (TRICOR) 48 MG tablet    Sig: Take 1 tablet (48 mg total) by mouth daily.    Dispense:  90 tablet    Refill:  3   celecoxib (CELEBREX) 100 MG capsule    Sig: Take 1 capsule (100 mg total) by mouth 2 (two) times daily as needed for moderate pain.    Dispense:  180 capsule    Refill:  [place], [place] [phone]

## 2021-02-22 ENCOUNTER — Ambulatory Visit (INDEPENDENT_AMBULATORY_CARE_PROVIDER_SITE_OTHER): Payer: 59 | Admitting: Internal Medicine

## 2021-02-22 ENCOUNTER — Other Ambulatory Visit: Payer: Self-pay

## 2021-02-22 ENCOUNTER — Encounter: Payer: Self-pay | Admitting: Internal Medicine

## 2021-02-22 ENCOUNTER — Telehealth: Payer: Self-pay | Admitting: *Deleted

## 2021-02-22 VITALS — BP 153/91 | HR 74 | Temp 97.5°F | Ht 71.0 in | Wt 195.0 lb

## 2021-02-22 DIAGNOSIS — K219 Gastro-esophageal reflux disease without esophagitis: Secondary | ICD-10-CM | POA: Diagnosis not present

## 2021-02-22 DIAGNOSIS — K580 Irritable bowel syndrome with diarrhea: Secondary | ICD-10-CM

## 2021-02-22 NOTE — Telephone Encounter (Signed)
FMLA forms were completed and faxed to [place]

## 2021-02-22 NOTE — Patient Instructions (Addendum)
For your irritable bowel syndrome and diarrhea, I recommend taking Imodium as needed.    Continue on Omeprazole daily for chronic reflux.    We will work on your FMLA paperwork and fax it to your office when completed.   Follow-up in 6 months  It was nice seeing you again today.  Dr. Abbey Chatters   At [facility] we value your feedback. You may receive a survey about your visit today. Please share your experience as we strive to create trusting relationships with our patients to provide genuine, compassionate, quality care.  We appreciate your understanding and patience as we review any laboratory studies, imaging, and other diagnostic tests that are ordered as we care for you. Our office policy is 5 business days for review of these results, and any emergent or urgent results are addressed in a timely manner for your best interest. If you do not hear from our office in 1 week, please contact us.   We also encourage the use of MyChart, which contains your medical information for your review as well. If you are not enrolled in this feature, an access code is on this after visit summary for your convenience. Thank you for allowing [place] to be involved in your care.  It was great to see you today!  I hope you have a great rest of your Fall!    Seth Mcmillan. Abbey Chatters, D.O. Gastroenterology and Hepatology [facility]

## 2021-03-07 NOTE — Progress Notes (Signed)
Referring Provider: [name], DO Primary Care Physician:  [name], DO Primary GI:  Dr. Abbey Chatters  Chief Complaint  Patient presents with   Diarrhea    Improved, occas flare. Has FMLA paperwork wants filled out    HPI:   Seth Mcmillan is a 55 y.o. male who presents to clinic today for follow-up visit.  He has a history of irritable bowel syndrome diarrhea predominant.   Today he states he is doing okay.  IBS is relatively well controlled on Imodium.  Does have flares on occasion requiring missed work.  Colonoscopy 05/25/2019 unremarkable with a 5-year recall due to surveillance.  EGD at the same time showed mild gastritis, biopsies negative for H. pylori.  Did have a Schatzki's ring.  Has undergone 2 courses of Xifaxan in the past as well.  Past Medical History:  Diagnosis Date   Anxiety    Arthritis    Depression    Fatty liver    GERD (gastroesophageal reflux disease)    Hyperlipidemia    Hypertension    IBS (irritable bowel syndrome)    Diarrhea type    Past Surgical History:  Procedure Laterality Date   ANTERIOR CERVICAL DECOMP/DISCECTOMY FUSION N/A 05/18/2018   Procedure: C6-7. C7-T1 ANTERIOR CERVICAL DECOMPRESSION/DISCECTOMY FUSION, ALLOGRAFT, PLATE;  Surgeon: Marybelle Killings, MD;  Location: [facility];  Service: Orthopedics;  Laterality: N/A;   BIOPSY  09/05/2015   Procedure: BIOPSY;  Surgeon: Danie Binder, MD;  [facility];  Service: Endoscopy;;  random colon bx   BIOPSY  05/25/2019   Procedure: BIOPSY;  Surgeon: Danie Binder, MD;  [facility];  Service: Endoscopy;;   COLONOSCOPY N/A 09/05/2015   Dr. Oneida Alar: 4 simple adenomas removed.  Next colonoscopy 3 years.   COLONOSCOPY WITH PROPOFOL N/A 05/25/2019   Procedure: COLONOSCOPY WITH PROPOFOL;  Surgeon: Danie Binder, MD;  [facility];  Service: Endoscopy;  Laterality: N/A;  12:15pm   ESOPHAGOGASTRODUODENOSCOPY (EGD) WITH PROPOFOL N/A 05/25/2019   Procedure:  ESOPHAGOGASTRODUODENOSCOPY (EGD) WITH PROPOFOL;  Surgeon: Danie Binder, MD;  [facility];  Service: Endoscopy;  Laterality: N/A;   KNEE SURGERY Right 1997   bursa sac   POLYPECTOMY  09/05/2015   Procedure: POLYPECTOMY;  Surgeon: Danie Binder, MD;  [facility];  Service: Endoscopy;;  colon polyps   SHOULDER ARTHROSCOPY WITH BICEPS TENDON REPAIR Right 2011   TONSILECTOMY, ADENOIDECTOMY, BILATERAL MYRINGOTOMY AND TUBES      Current Outpatient Medications  Medication Sig Dispense Refill   amLODipine (NORVASC) 5 MG tablet Take 1 tablet (5 mg total) by mouth daily. 90 tablet 3   Ascorbic Acid (VITAMIN C) 1000 MG tablet Take 1,000 mg by mouth daily.     aspirin EC 81 MG tablet Take 81 mg by mouth daily.     cholecalciferol (VITAMIN D3) 25 MCG (1000 UT) tablet Take 1,000 Units by mouth daily.     escitalopram (LEXAPRO) 10 MG tablet Take 1 tablet (10 mg total) by mouth daily. 90 tablet 3   fenofibrate (TRICOR) 48 MG tablet Take 1 tablet (48 mg total) by mouth daily. 90 tablet 3   omeprazole (PRILOSEC) 40 MG capsule Take 1 capsule by mouth once daily 30 capsule 1   rosuvastatin (CRESTOR) 40 MG tablet Take 1 tablet (40 mg total) by mouth daily. 90 tablet 3   VITAMIN E PO Take by mouth daily.     celecoxib (CELEBREX) 100 MG  capsule Take 1 capsule (100 mg total) by mouth 2 (two) times daily as needed for moderate pain. (Patient not taking: Reported on 02/22/2021) 737 capsule 1   folic acid (FOLVITE) 1 MG tablet Take 1 mg by mouth daily. (Patient not taking: Reported on 02/20/2021)     lidocaine (LIDODERM) 5 % Place 1 patch onto the skin daily as needed (rib pain). Remove & Discard patch within 12 hours or as directed by MD (Patient not taking: Reported on 02/20/2021) 30 patch prn   ondansetron (ZOFRAN) 4 MG tablet Take 1 tablet (4 mg total) by mouth every 8 (eight) hours as needed for nausea or vomiting. (Patient not taking: Reported on 02/22/2021) 30 tablet 2   No current  facility-administered medications for this visit.    Allergies as of 02/22/2021   (No Known Allergies)    Family History  Problem Relation Age of Onset   Heart disease Mother    Dementia Mother    Heart disease Father    Diabetes Brother    Colon cancer Neg Hx     Social History   Socioeconomic History   Marital status: Divorced    Spouse name: Not on file   Number of children: Not on file   Years of education: Not on file   Highest education level: Not on file  Occupational History   Not on file  Tobacco Use   Smoking status: Former    Packs/day: 1.00    Years: 25.00    Pack years: 25.00    Types: Cigarettes    Quit date: 07/16/2017    Years since quitting: 3.6   Smokeless tobacco: Never  Vaping Use   Vaping Use: Never used  Substance and Sexual Activity   Alcohol use: Yes    Alcohol/week: 12.0 standard drinks    Types: 12 Cans of beer per week    Comment: 12 cans of beer/week, mostly on weekends.   Drug use: No   Sexual activity: Not on file  Other Topics Concern   Not on file  Social History Narrative   Not on file   Social Determinants of Health   Financial Resource Strain: Not on file  Food Insecurity: Not on file  Transportation Needs: Not on file  Physical Activity: Not on file  Stress: Not on file  Social Connections: Not on file    Subjective: Review of Systems  Constitutional:  Negative for chills and fever.  HENT:  Negative for congestion and hearing loss.   Eyes:  Negative for blurred vision and double vision.  Respiratory:  Negative for cough and shortness of breath.   Cardiovascular:  Negative for chest pain and palpitations.  Gastrointestinal:  Positive for diarrhea and nausea. Negative for abdominal pain, blood in stool, constipation, heartburn, melena and vomiting.  Genitourinary:  Negative for dysuria and urgency.  Musculoskeletal:  Negative for joint pain and myalgias.  Skin:  Negative for itching and rash.  Neurological:   Negative for dizziness and headaches.  Psychiatric/Behavioral:  Negative for depression. The patient is not nervous/anxious.     Objective: BP (!) 153/91    Pulse 74    Temp (!) 97.5 F (36.4 C)    Ht 5\' 11"  (1.803 m)    Wt 195 lb (88.5 kg)    BMI 27.20 kg/m  Physical Exam Constitutional:      Appearance: Normal appearance.  HENT:     Head: Normocephalic and atraumatic.  Eyes:     Extraocular Movements: Extraocular movements  intact.     Conjunctiva/sclera: Conjunctivae normal.  Cardiovascular:     Rate and Rhythm: Normal rate and regular rhythm.  Pulmonary:     Effort: Pulmonary effort is normal.     Breath sounds: Normal breath sounds.  Abdominal:     General: Bowel sounds are normal.     Palpations: Abdomen is soft.  Musculoskeletal:        General: Normal range of motion.     Cervical back: Normal range of motion and neck supple.  Skin:    General: Skin is warm.  Neurological:     General: No focal deficit present.     Mental Status: He is alert and oriented to person, place, and time.  Psychiatric:        Mood and Affect: Mood normal.        Behavior: Behavior normal.     Assessment: *Irritable bowel syndrome-diarrhea predominant *GERD-well-controlled on omeprazole  Plan: For patient's IBS with diarrhea, continue on Imodium as needed.  GERD well-controlled on omeprazole.  We will continue.  FMLA paperwork filled out.  Follow-up in 6 months.  03/07/2021 2:47 PM   Disclaimer: This note was dictated with voice recognition software. Similar sounding words can inadvertently be transcribed and may not be corrected upon review.

## 2021-04-03 ENCOUNTER — Other Ambulatory Visit: Payer: Self-pay | Admitting: Family Medicine

## 2021-04-03 DIAGNOSIS — K219 Gastro-esophageal reflux disease without esophagitis: Secondary | ICD-10-CM

## 2021-05-22 ENCOUNTER — Other Ambulatory Visit: Payer: Self-pay | Admitting: Family Medicine

## 2021-05-22 DIAGNOSIS — I10 Essential (primary) hypertension: Secondary | ICD-10-CM

## 2021-05-22 DIAGNOSIS — F4322 Adjustment disorder with anxiety: Secondary | ICD-10-CM

## 2021-05-30 NOTE — Progress Notes (Addendum)
? ? ?Primary Care Physician:  [name], DO  ?Primary GI: Dr. Abbey Chatters ? ?Patient Location: Home ?  ?Provider Location: [facility] office ?  ?Reason for Visit: Diarrhea and nausea. ?  ?Persons present on the virtual encounter, with roles: Aliene Altes, PA-C (provider), Seth Mcmillan (patient) ?  ?Total time (minutes) spent on medical discussion: 15 minutes ? ?Virtual Visit via Telephone Note ?Due to COVID-19, visit is conducted virtually and was requested by patient.  ? ?I connected with Seth Mcmillan on 05/31/21 at 11:30 AM EDT by telephone and verified that I am speaking with the correct person using two identifiers. ?  ?I discussed the limitations, risks, security and privacy concerns of performing an evaluation and management service by telephone and the availability of in person appointments. I also discussed with the patient that there may be a patient responsible charge related to this service. The patient expressed understanding and agreed to proceed. ? ?Chief Complaint  ?Patient presents with  ? Medication Management  ?  Meds not working. Nausea, diarrhea, losing weight  ? ? ? ?History of Present Illness: ?56 year old male with history of IBS-D and GERD, presenting today with chief complaint of diarrhea and nausea.  EGD and colonoscopy on file from March 2021.  Colonoscopy was unremarkable with recommendations to repeat in 5 years.  EGD with mild gastritis, biopsies negative for H. pylori.  He did have a Schatzki's ring that was not manipulated.  IBS has been treated with Xifaxan x2 in the past, last in June 2022. ? ?Last seen in our office December 2022.  Reported IBS symptoms were relatively well controlled with Imodium.  Flares on occasion requiring missed work.  GERD doing well on omeprazole 40 mg daily.  Recommended continuing Imodium and omeprazole, follow-up in 6 months. ? ?Today: ?Unfortunately, patient was not able to connect his video.  We completed the appointment via telephone. ? ?Patient  initially stated his omeprazole was not working as well for him anymore, but he felt this was to control his diarrhea.  His typical reflux symptoms are very well controlled on omeprazole. ? ?For the last couple of months, has been having increasing frequency of diarrhea and nausea without vomiting.  The last week has been the worst.  States the last 2 days, he has had 15 total watery bowel movements.  States anytime he eats something, he ends up in the bathroom.  Over the weekend, he was having 3-4 watery bowel movements daily.  His prior baseline was 1-2 bowel movements daily.  Denies BRBPR, melena, abdominal pain.  Reports the symptoms are somewhat similar to prior IBS symptoms, but severity is worse.  Denies fever, chills, recent viral illness, nocturnal stools. ? ?No recent abx, no well water, no sick contacts.  ?Took imodium which helped some.  ?No new medications.  ?No dietary changes.  ? ?Not eating much because he doesn't feel well and keeps going to the bathroom.  States he has lost 4 pounds in the last week.  ? ?Past Medical History:  ?Diagnosis Date  ? Anxiety   ? Arthritis   ? Depression   ? Fatty liver   ? GERD (gastroesophageal reflux disease)   ? Hyperlipidemia   ? Hypertension   ? IBS (irritable bowel syndrome)   ? Diarrhea type  ? ? ? ?Past Surgical History:  ?Procedure Laterality Date  ? ANTERIOR CERVICAL DECOMP/DISCECTOMY FUSION N/A 05/18/2018  ? Procedure: C6-7. C7-T1 ANTERIOR CERVICAL DECOMPRESSION/DISCECTOMY FUSION, ALLOGRAFT, PLATE;  Surgeon: Marybelle Killings, MD;  Location: [facility];  Service: Orthopedics;  Laterality: N/A;  ? BIOPSY  09/05/2015  ? Procedure: BIOPSY;  Surgeon: Danie Binder, MD;  [facility];  Service: Endoscopy;;  random colon bx  ? BIOPSY  05/25/2019  ? Procedure: BIOPSY;  Surgeon: Danie Binder, MD;  [facility];  Service: Endoscopy;;  ? COLONOSCOPY N/A 09/05/2015  ? Dr. Oneida Alar: 4 simple adenomas removed.  Next colonoscopy 3 years.  ? COLONOSCOPY WITH  PROPOFOL N/A 05/25/2019  ? Procedure: COLONOSCOPY WITH PROPOFOL;  Surgeon: Danie Binder, MD;  [facility];  Service: Endoscopy;  Laterality: N/A;  12:15pm  ? ESOPHAGOGASTRODUODENOSCOPY (EGD) WITH PROPOFOL N/A 05/25/2019  ? Procedure: ESOPHAGOGASTRODUODENOSCOPY (EGD) WITH PROPOFOL;  Surgeon: Danie Binder, MD;  [facility];  Service: Endoscopy;  Laterality: N/A;  ? KNEE SURGERY Right 1997  ? bursa sac  ? POLYPECTOMY  09/05/2015  ? Procedure: POLYPECTOMY;  Surgeon: Danie Binder, MD;  [facility];  Service: Endoscopy;;  colon polyps  ? SHOULDER ARTHROSCOPY WITH BICEPS TENDON REPAIR Right 2011  ? TONSILECTOMY, ADENOIDECTOMY, BILATERAL MYRINGOTOMY AND TUBES    ? ? ? ?Current Meds  ?Medication Sig  ? amLODipine (NORVASC) 5 MG tablet Take 1 tablet by mouth once daily  ? Ascorbic Acid (VITAMIN C) 1000 MG tablet Take 1,000 mg by mouth daily.  ? aspirin EC 81 MG tablet Take 81 mg by mouth daily.  ? celecoxib (CELEBREX) 100 MG capsule Take 1 capsule (100 mg total) by mouth 2 (two) times daily as needed for moderate pain.  ? cholecalciferol (VITAMIN D3) 25 MCG (1000 UT) tablet Take 1,000 Units by mouth daily.  ? escitalopram (LEXAPRO) 10 MG tablet Take 1 tablet by mouth once daily  ? fenofibrate (TRICOR) 48 MG tablet Take 1 tablet (48 mg total) by mouth daily.  ? omeprazole (PRILOSEC) 40 MG capsule Take 1 capsule by mouth once daily  ? ondansetron (ZOFRAN) 4 MG tablet Take 1 tablet (4 mg total) by mouth every 8 (eight) hours as needed for nausea or vomiting.  ? rosuvastatin (CRESTOR) 40 MG tablet Take 1 tablet (40 mg total) by mouth daily.  ? VITAMIN E PO Take by mouth daily.  ?  ? ?Family History  ?Problem Relation Age of Onset  ? Heart disease Mother   ? Dementia Mother   ? Heart disease Father   ? Diabetes Brother   ? Colon cancer Neg Hx   ? ? ?Social History  ? ?Socioeconomic History  ? Marital status: Divorced  ?  Spouse name: Not on file  ? Number of children: Not on file  ? Years of  education: Not on file  ? Highest education level: Not on file  ?Occupational History  ? Not on file  ?Tobacco Use  ? Smoking status: Former  ?  Packs/day: 1.00  ?  Years: 25.00  ?  Pack years: 25.00  ?  Types: Cigarettes  ?  Quit date: 07/16/2017  ?  Years since quitting: 3.8  ? Smokeless tobacco: Never  ?Vaping Use  ? Vaping Use: Never used  ?Substance and Sexual Activity  ? Alcohol use: Yes  ?  Alcohol/week: 12.0 standard drinks  ?  Types: 12 Cans of beer per week  ?  Comment: 12 cans of beer/week, mostly on weekends.  ? Drug use: No  ? Sexual activity: Not on file  ?Other Topics Concern  ? Not on file  ?Social History Narrative  ?  Not on file  ? ?Social Determinants of Health  ? ?Financial Resource Strain: Not on file  ?Food Insecurity: Not on file  ?Transportation Needs: Not on file  ?Physical Activity: Not on file  ?Stress: Not on file  ?Social Connections: Not on file  ? ? ? ? ? Review of Systems: ?Gen: Denies fever, chills, cold or flulike symptoms, presyncope, syncope. ?CV: Denies chest pain, palpitations ?Resp: Denies dyspnea or cough. ?GI: see HPI ?Heme: See HPI ? ?Observations/Objective: ?No distress. Alert and oriented. Pleasant.  No distress.  Unable to perform complete physical exam due to telephone encounter. No video available.  ? ? ?Assessment:  ?56 year old male with history of IBS-D and GERD, presenting today with chief complaint of diarrhea and nausea.  Diarrhea has been worsening for the last couple of months with associated nausea.  Prior baseline with 1-2 BMs daily, had increased to 3-4 daily, and now 7-8 bowel movements per day for the last couple of days.  Also reports 4 pound weight loss over the last week due to significant diarrhea and decreased oral intake.  Denies abdominal pain, BRBPR, melena, nocturnal stools, medication changes, recent antibiotics, well water, sick contacts, or recent viral illness. No NSAIDs.  Prior random colon biopsies in 2017 were benign.  Colonoscopy is  up-to-date as of 2021, surveillance due in 2026. ? ?Differentials include infectious diarrhea, IBS-D flare, microscopic colitis, celiac disease. Will check stool studies, celiac serologies, CBC, and BMP.  If stool studies

## 2021-05-31 ENCOUNTER — Encounter: Payer: Self-pay | Admitting: Gastroenterology

## 2021-05-31 ENCOUNTER — Telehealth (INDEPENDENT_AMBULATORY_CARE_PROVIDER_SITE_OTHER): Payer: 59 | Admitting: Gastroenterology

## 2021-05-31 ENCOUNTER — Telehealth: Payer: Self-pay | Admitting: *Deleted

## 2021-05-31 ENCOUNTER — Other Ambulatory Visit: Payer: Self-pay

## 2021-05-31 VITALS — Ht 71.0 in | Wt 185.0 lb

## 2021-05-31 DIAGNOSIS — R110 Nausea: Secondary | ICD-10-CM

## 2021-05-31 DIAGNOSIS — R197 Diarrhea, unspecified: Secondary | ICD-10-CM | POA: Diagnosis not present

## 2021-05-31 MED ORDER — ONDANSETRON HCL 4 MG PO TABS
4.0000 mg | ORAL_TABLET | Freq: Three times a day (TID) | ORAL | 0 refills | Status: DC | PRN
Start: 2021-05-31 — End: 2021-10-08

## 2021-05-31 NOTE — Telephone Encounter (Signed)
Seth Mcmillan, you are scheduled for a virtual visit with your provider today.  Just as we do with appointments in the office, we must obtain your consent to participate.  Your consent will be active for this visit and any virtual visit you may have with one of our providers in the next 365 days.  If you have a MyChart account, I can also send a copy of this consent to you electronically.  All virtual visits are billed to your insurance company just like a traditional visit in the office.  As this is a virtual visit, video technology does not allow for your provider to perform a traditional examination.  This may limit your provider's ability to fully assess your condition.  If your provider identifies any concerns that need to be evaluated in person or the need to arrange testing such as labs, EKG, etc, we will make arrangements to do so.  Although advances in technology are sophisticated, we cannot ensure that it will always work on either your end or our end.  If the connection with a video visit is poor, we may have to switch to a telephone visit.  With either a video or telephone visit, we are not always able to ensure that we have a secure connection.   I need to obtain your verbal consent now.   Are you willing to proceed with your visit today?  ?

## 2021-05-31 NOTE — Patient Instructions (Signed)
Please have blood work and stool studies completed at [facility] address: [address], [place], [place] [address] ?  ? ?Use Zofran 4 mg every 8 hours as needed for nausea. ? ?Stick to a bland diet for now. ? ?Be sure that you are drinking enough to keep yourself well-hydrated.  (Drink enough to keep your urine pale yellow.) ?Drink fluids that contain electrolytes such as Pedialyte and G2. ? ?We will call you with your results and further recommendations. ? ?Aliene Altes, PA-C ?[facility] ? ? ?

## 2021-05-31 NOTE — Telephone Encounter (Signed)
Pt consented to a virtual visit. 

## 2021-06-04 ENCOUNTER — Other Ambulatory Visit: Payer: Self-pay | Admitting: Gastroenterology

## 2021-06-04 DIAGNOSIS — R197 Diarrhea, unspecified: Secondary | ICD-10-CM

## 2021-06-05 LAB — CBC WITH DIFFERENTIAL/PLATELET
Absolute Monocytes: 614 cells/uL (ref 200–950)
Basophils Absolute: 81 cells/uL (ref 0–200)
Basophils Relative: 1.3 %
Eosinophils Absolute: 242 cells/uL (ref 15–500)
Eosinophils Relative: 3.9 %
HCT: 42.4 % (ref 38.5–50.0)
Hemoglobin: 14.3 g/dL (ref 13.2–17.1)
Lymphs Abs: 2449 cells/uL (ref 850–3900)
MCH: 31.4 pg (ref 27.0–33.0)
MCHC: 33.7 g/dL (ref 32.0–36.0)
MCV: 93.2 fL (ref 80.0–100.0)
MPV: 11.7 fL (ref 7.5–12.5)
Monocytes Relative: 9.9 %
Neutro Abs: 2815 cells/uL (ref 1500–7800)
Neutrophils Relative %: 45.4 %
Platelets: 235 10*3/uL (ref 140–400)
RBC: 4.55 10*6/uL (ref 4.20–5.80)
RDW: 13 % (ref 11.0–15.0)
Total Lymphocyte: 39.5 %
WBC: 6.2 10*3/uL (ref 3.8–10.8)

## 2021-06-05 LAB — TISSUE TRANSGLUTAMINASE, IGA: (tTG) Ab, IgA: <1 U/mL

## 2021-06-05 LAB — BASIC METABOLIC PANEL
BUN: 19 mg/dL (ref 7–25)
CO2: 22 mmol/L (ref 20–32)
Calcium: 9.3 mg/dL (ref 8.6–10.3)
Chloride: 104 mmol/L (ref 98–110)
Creat: 1.1 mg/dL (ref 0.70–1.30)
Glucose, Bld: 91 mg/dL (ref 65–99)
Potassium: 4.2 mmol/L (ref 3.5–5.3)
Sodium: 136 mmol/L (ref 135–146)

## 2021-06-05 LAB — C. DIFFICILE GDH AND TOXIN A/B
GDH ANTIGEN: NOT DETECTED
MICRO NUMBER:: 13154415
SPECIMEN QUALITY:: ADEQUATE
TOXIN A AND B: NOT DETECTED

## 2021-06-05 LAB — IGA: Immunoglobulin A: 176 mg/dL (ref 47–310)

## 2021-06-07 ENCOUNTER — Telehealth: Payer: Self-pay | Admitting: Gastroenterology

## 2021-06-07 NOTE — Telephone Encounter (Signed)
Spoke to pt, informed him that we were still waiting on results and would call when they came available.   ?

## 2021-06-07 NOTE — Telephone Encounter (Signed)
Noted  

## 2021-06-07 NOTE — Telephone Encounter (Signed)
Patient called asking about stool results  ?

## 2021-06-07 NOTE — Telephone Encounter (Signed)
Please see separate documentation under result note.  ?

## 2021-06-21 LAB — GASTROINTESTINAL PATHOGEN PNL
CampyloBacter Group: NOT DETECTED
Norovirus GI/GII: NOT DETECTED
Rotavirus A: NOT DETECTED
Salmonella species: NOT DETECTED
Shiga Toxin 1: NOT DETECTED
Shiga Toxin 2: NOT DETECTED
Shigella Species: NOT DETECTED
Vibrio Group: NOT DETECTED
Yersinia enterocolitica: NOT DETECTED

## 2021-07-30 ENCOUNTER — Encounter: Payer: Self-pay | Admitting: Internal Medicine

## 2021-08-16 ENCOUNTER — Telehealth: Payer: Self-pay | Admitting: Internal Medicine

## 2021-08-16 NOTE — Telephone Encounter (Signed)
Pt called to say he would be by the office Monday to drop off FMLA papers and asked if we would type up a note stating he came by to deliver the papers so it wouldn't go against him at work. Per Reba, she said that would be ok.

## 2021-08-20 ENCOUNTER — Encounter: Payer: Self-pay | Admitting: Internal Medicine

## 2021-08-20 ENCOUNTER — Telehealth: Payer: Self-pay | Admitting: Gastroenterology

## 2021-08-20 NOTE — Telephone Encounter (Signed)
Noted  

## 2021-08-20 NOTE — Telephone Encounter (Signed)
PATIENT DROPPED OFF FMLA PAPERS

## 2021-08-20 NOTE — Telephone Encounter (Signed)
Received FMLA papers. Will fill them out as soon as possible.

## 2021-08-21 ENCOUNTER — Ambulatory Visit: Payer: 59 | Admitting: Family Medicine

## 2021-08-21 ENCOUNTER — Ambulatory Visit (INDEPENDENT_AMBULATORY_CARE_PROVIDER_SITE_OTHER): Payer: 59 | Admitting: Family Medicine

## 2021-08-21 ENCOUNTER — Encounter: Payer: Self-pay | Admitting: Family Medicine

## 2021-08-21 VITALS — BP 149/97 | HR 72 | Temp 97.2°F | Ht 71.0 in | Wt 187.0 lb

## 2021-08-21 DIAGNOSIS — Z87891 Personal history of nicotine dependence: Secondary | ICD-10-CM

## 2021-08-21 DIAGNOSIS — K580 Irritable bowel syndrome with diarrhea: Secondary | ICD-10-CM

## 2021-08-21 DIAGNOSIS — Z125 Encounter for screening for malignant neoplasm of prostate: Secondary | ICD-10-CM

## 2021-08-21 DIAGNOSIS — E782 Mixed hyperlipidemia: Secondary | ICD-10-CM | POA: Diagnosis not present

## 2021-08-21 DIAGNOSIS — D229 Melanocytic nevi, unspecified: Secondary | ICD-10-CM

## 2021-08-21 DIAGNOSIS — M19042 Primary osteoarthritis, left hand: Secondary | ICD-10-CM

## 2021-08-21 DIAGNOSIS — I10 Essential (primary) hypertension: Secondary | ICD-10-CM

## 2021-08-21 DIAGNOSIS — Z0000 Encounter for general adult medical examination without abnormal findings: Secondary | ICD-10-CM

## 2021-08-21 DIAGNOSIS — Z0001 Encounter for general adult medical examination with abnormal findings: Secondary | ICD-10-CM

## 2021-08-21 DIAGNOSIS — Z23 Encounter for immunization: Secondary | ICD-10-CM | POA: Diagnosis not present

## 2021-08-21 DIAGNOSIS — K219 Gastro-esophageal reflux disease without esophagitis: Secondary | ICD-10-CM

## 2021-08-21 DIAGNOSIS — F4322 Adjustment disorder with anxiety: Secondary | ICD-10-CM

## 2021-08-21 MED ORDER — OMEPRAZOLE 40 MG PO CPDR: 40.0000 mg | DELAYED_RELEASE_CAPSULE | Freq: Every day | ORAL | 3 refills | Status: DC

## 2021-08-21 MED ORDER — FENOFIBRATE 48 MG PO TABS: 48.0000 mg | ORAL_TABLET | Freq: Every day | ORAL | 3 refills | Status: DC

## 2021-08-21 MED ORDER — AMLODIPINE BESYLATE 5 MG PO TABS: 5.0000 mg | ORAL_TABLET | Freq: Every day | ORAL | 3 refills | Status: DC

## 2021-08-21 MED ORDER — ROSUVASTATIN CALCIUM 40 MG PO TABS: 40.0000 mg | ORAL_TABLET | Freq: Every day | ORAL | 3 refills | Status: DC

## 2021-08-21 MED ORDER — CELECOXIB 100 MG PO CAPS: 100.0000 mg | ORAL_CAPSULE | Freq: Two times a day (BID) | ORAL | 1 refills | Status: DC | PRN

## 2021-08-21 MED ORDER — ESCITALOPRAM OXALATE 10 MG PO TABS: 10.0000 mg | ORAL_TABLET | Freq: Every day | ORAL | 3 refills | Status: DC

## 2021-08-21 NOTE — Addendum Note (Signed)
Addended by: Everlean Cherry on: 08/21/2021 09:51 AM   Modules accepted: Orders

## 2021-08-21 NOTE — Progress Notes (Signed)
Seth Mcmillan is a 56 y.o. male presents to office today for annual physical exam examination.    Concerns today include: 1.  Mole Patient reports he has a mole in the right low back that is been present since childhood.  It seems to be getting larger and it subsequently gets caught on his belt and or waist of his jeans.  No discoloration but it is irritating  Occupation: works at a [employer],  Substance use: former smoker 1ppd x30 yrs/ drinks ETOH on weekends Diet: fiber, water, Exercise: active at work Last eye exam: n.a Last colonoscopy: UTD sees Dr Oneida Alar Refills needed today: all Immunizations needed: Shingles vaccine, Tetanus vaccine Immunization History  Administered Date(s) Administered   Moderna Sars-Covid-2 Vaccination 07/20/2019, 09/15/2019   Tdap 07/07/2009     Past Medical History:  Diagnosis Date   Anxiety    Arthritis    Depression    Fatty liver    GERD (gastroesophageal reflux disease)    Hyperlipidemia    Hypertension    IBS (irritable bowel syndrome)    Diarrhea type   Social History   Socioeconomic History   Marital status: Divorced    Spouse name: Not on file   Number of children: Not on file   Years of education: Not on file   Highest education level: Not on file  Occupational History   Not on file  Tobacco Use   Smoking status: Former    Packs/day: 1.00    Years: 25.00    Pack years: 25.00    Types: Cigarettes    Quit date: 07/16/2017    Years since quitting: 4.1   Smokeless tobacco: Never  Vaping Use   Vaping Use: Never used  Substance and Sexual Activity   Alcohol use: Yes    Alcohol/week: 12.0 standard drinks    Types: 12 Cans of beer per week    Comment: 12 cans of beer/week, mostly on weekends.   Drug use: No   Sexual activity: Not on file  Other Topics Concern   Not on file  Social History Narrative   Works at a cigarette [place] Strain: Not on file  Food  Insecurity: Not on file  Transportation Needs: Not on file  Physical Activity: Not on file  Stress: Not on file  Social Connections: Not on file  Intimate Partner Violence: Not on file   Past Surgical History:  Procedure Laterality Date   ANTERIOR CERVICAL DECOMP/DISCECTOMY FUSION N/A 05/18/2018   Procedure: C6-7. C7-T1 ANTERIOR CERVICAL DECOMPRESSION/DISCECTOMY FUSION, ALLOGRAFT, PLATE;  Surgeon: Marybelle Killings, MD;  Location: [facility];  Service: Orthopedics;  Laterality: N/A;   BIOPSY  09/05/2015   Procedure: BIOPSY;  Surgeon: Danie Binder, MD;  [facility];  Service: Endoscopy;;  random colon bx   BIOPSY  05/25/2019   Procedure: BIOPSY;  Surgeon: Danie Binder, MD;  [facility];  Service: Endoscopy;;   COLONOSCOPY N/A 09/05/2015   Dr. Oneida Alar: 4 simple adenomas removed.  Next colonoscopy 3 years.   COLONOSCOPY WITH PROPOFOL N/A 05/25/2019   Procedure: COLONOSCOPY WITH PROPOFOL;  Surgeon: Danie Binder, MD;  [facility];  Service: Endoscopy;  Laterality: N/A;  12:15pm   ESOPHAGOGASTRODUODENOSCOPY (EGD) WITH PROPOFOL N/A 05/25/2019   Procedure: ESOPHAGOGASTRODUODENOSCOPY (EGD) WITH PROPOFOL;  Surgeon: Danie Binder, MD;  [facility];  Service: Endoscopy;  Laterality: N/A;   KNEE SURGERY Right 1997  bursa sac   POLYPECTOMY  09/05/2015   Procedure: POLYPECTOMY;  Surgeon: Danie Binder, MD;  [facility];  Service: Endoscopy;;  colon polyps   SHOULDER ARTHROSCOPY WITH BICEPS TENDON REPAIR Right 2011   TONSILECTOMY, ADENOIDECTOMY, BILATERAL MYRINGOTOMY AND TUBES     Family History  Problem Relation Age of Onset   Heart disease Mother    Dementia Mother    Heart disease Father    Diabetes Brother    Colon cancer Neg Hx     Current Outpatient Medications:    Ascorbic Acid (VITAMIN C) 1000 MG tablet, Take 1,000 mg by mouth daily., Disp: , Rfl:    aspirin EC 81 MG tablet, Take 81 mg by mouth daily., Disp: , Rfl:    cholecalciferol  (VITAMIN D3) 25 MCG (1000 UT) tablet, Take 1,000 Units by mouth daily., Disp: , Rfl:    ondansetron (ZOFRAN) 4 MG tablet, Take 1 tablet (4 mg total) by mouth every 8 (eight) hours as needed for nausea or vomiting., Disp: 30 tablet, Rfl: 0   VITAMIN E PO, Take by mouth daily., Disp: , Rfl:    amLODipine (NORVASC) 5 MG tablet, Take 1 tablet (5 mg total) by mouth daily., Disp: 90 tablet, Rfl: 3   celecoxib (CELEBREX) 100 MG capsule, Take 1 capsule (100 mg total) by mouth 2 (two) times daily as needed for moderate pain., Disp: 180 capsule, Rfl: 1   escitalopram (LEXAPRO) 10 MG tablet, Take 1 tablet (10 mg total) by mouth daily., Disp: 90 tablet, Rfl: 3   fenofibrate (TRICOR) 48 MG tablet, Take 1 tablet (48 mg total) by mouth daily., Disp: 90 tablet, Rfl: 3   omeprazole (PRILOSEC) 40 MG capsule, Take 1 capsule (40 mg total) by mouth daily., Disp: 90 capsule, Rfl: 3   rosuvastatin (CRESTOR) 40 MG tablet, Take 1 tablet (40 mg total) by mouth daily., Disp: 90 tablet, Rfl: 3  No Known Allergies   ROS: Review of Systems A comprehensive review of systems was negative except for: Eyes: positive for reading glasses Gastrointestinal: positive for IBS-D Integument/breast: positive for skin lesion(s) Behavioral/Psych: positive for controlled mood disorder    Physical exam BP (!) 149/97   Pulse 72   Temp (!) 97.2 F (36.2 C)   Ht $R'5\' 11"'nR$  (1.803 m)   Wt 187 lb (84.8 kg)   SpO2 97%   BMI 26.08 kg/m  General appearance: alert, cooperative, appears stated age, and no distress Head: Normocephalic, without obvious abnormality, atraumatic Eyes: negative findings: lids and lashes normal and conjunctivae and sclerae normal Ears: normal TM's and external ear canals both ears Nose: Nares normal. Septum midline. Mucosa normal. No drainage or sinus tenderness. Throat: lips, mucosa, and tongue normal; teeth and gums normal Neck: no adenopathy, no carotid bruit, supple, symmetrical, trachea midline, and thyroid  not enlarged, symmetric, no tenderness/mass/nodules Back: symmetric, no curvature. ROM normal. No CVA tenderness. Lungs: clear to auscultation bilaterally Chest wall: no tenderness Heart: regular rate and rhythm, S1, S2 normal, no murmur, click, rub or gallop Abdomen: soft, non-tender; bowel sounds normal; no masses,  no organomegaly Extremities: extremities normal, atraumatic, no cyanosis or edema Pulses: 2+ and symmetric Skin:  Flesh-colored skin tag like lesion along the right lower back with a very large base Lymph nodes: Cervical, supraclavicular, and axillary nodes normal. Has some abrasions along forearms with various stages of scarring Neurologic: Grossly normal Psych: Affect flat mood stable.    08/21/2021    8:31 AM 02/20/2021    3:16  PM 04/25/2020   10:07 AM  Depression screen PHQ 2/9  Decreased Interest 0 0 0  Down, Depressed, Hopeless 0 0 0  PHQ - 2 Score 0 0 0  Altered sleeping   0  Tired, decreased energy   0  Change in appetite   0  Feeling bad or failure about yourself    0  Trouble concentrating   0  Moving slowly or fidgety/restless   0  Suicidal thoughts   0  PHQ-9 Score   0  Difficult doing work/chores   Not difficult at all      08/21/2021    8:32 AM 02/20/2021    3:16 PM 04/25/2020   10:07 AM 04/11/2020    4:04 PM  GAD 7 : Generalized Anxiety Score  Nervous, Anxious, on Edge 0 0 0 0  Control/stop worrying 0 0 0 0  Worry too much - different things 0 0 0 0  Trouble relaxing 0 0 0 0  Restless 0 0 0 0  Easily annoyed or irritable 0 0 0 0  Afraid - awful might happen 0 0 0 0  Total GAD 7 Score 0 0 0 0  Anxiety Difficulty Not difficult at all Not difficult at all Not difficult at all Somewhat difficult   Assessment/ Plan: [name] here for annual physical exam.   Annual physical exam  Former heavy cigarette smoker (20-39 per day) - Plan: CT CHEST LUNG CA SCREEN LOW DOSE W/O CM  Mixed hyperlipidemia - Plan: Lipid Panel, TSH, CMP14+EGFR, fenofibrate  (TRICOR) 48 MG tablet, rosuvastatin (CRESTOR) 40 MG tablet  Essential hypertension - Plan: CMP14+EGFR, amLODipine (NORVASC) 5 MG tablet  Gastroesophageal reflux disease without esophagitis - Plan: CBC, omeprazole (PRILOSEC) 40 MG capsule  Irritable bowel syndrome with diarrhea - Plan: TSH, CMP14+EGFR  Screening for malignant neoplasm of prostate - Plan: PSA  Adjustment disorder with anxious mood - Plan: escitalopram (LEXAPRO) 10 MG tablet  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  Declined shingles vaccination.  We will proceed with CT lung cancer screening given family history of pulmonary disease and former heavy smoking status.  Blood pressure is not at goal but he has not taken his blood pressure medication this morning.  I would like him to return in 2 weeks for blood pressure recheck.  Norvasc renewed.  Check nonfasting labs  Check CBC, omeprazole renewed.  Continue to see GI as scheduled for IBS and GERD  PSA collected.  Asymptomatic  Mood disorder is stable with Lexapro.  This has been renewed  Celebrex renewed for as needed use.  Continue to use sparingly given GERD  The lesion on the right flank is likely benign but wishes to have this removed so referral to dermatology placed today  Counseled on healthy lifestyle choices, including diet (rich in fruits, vegetables and lean meats and low in salt and simple carbohydrates) and exercise (at least 30 minutes of moderate physical activity daily).  Patient to follow up in 1 year for annual exam or sooner if needed.  Kavian Peters M. Lajuana Ripple, DO

## 2021-08-21 NOTE — Patient Instructions (Signed)
You had labs performed today.  You will be contacted with the results of the labs once they are available, usually in the next 3 business days for routine lab work.  If you have an active my chart account, they will be released to your MyChart.  If you prefer to have these labs released to you via telephone, please let us know.  Tetanus shot administered today.  This should be good for 10 years. Shingles shot is available to you now CT for lung cancer screening ordered at [name]. They will contact you for an appointment Referral to dermatology placed to get that mole removed from your back.  Lung Cancer Screening A lung cancer screening is a test that checks for lung cancer when there are no symptoms or history of that disease. The screening is done to look for lung cancer in its very early stages. Finding cancer early improves the chances of successful treatment. It may save your life. Who should have a screening? You should be screened for lung cancer if all of these apply: You currently smoke, or you have quit smoking within the past 15 years. You are between the ages of 56 and 22 years old. Screening may be recommended up to age 59 depending on your overall health and other factors. You have a smoking history of 1 pack of cigarettes a day for 20 years or 2 packs a day for 10 years. How is screening done?  The recommended screening test is a low-dose computed tomography (LDCT) scan. This scan takes detailed images of the lungs. This allows a health care provider to look for abnormal cells. If you are at risk for lung cancer, it is recommended that you get screened once a year. Talk to your health care provider about the risks, benefits, and limitations of screening. What are the benefits of screening? Screening can find lung cancer early, before symptoms start and before it has spread outside of the lungs. The chances of curing lung cancer are greater if the cancer is diagnosed early. What  are the risks of screening? The screening may show lung cancer when no cancer is present. Talk with your health care provider about what your results mean. In some cases, your health care provider may do more testing to confirm the results. The screening may not find lung cancer when it is present. You will be exposed to radiation from repeated LDCT tests, which can cause cancer in otherwise healthy people. How can I lower my risk of lung cancer? Make these lifestyle changes to lower your risk of developing lung cancer: Do not use any products that contain nicotine or tobacco. These products include cigarettes, chewing tobacco, and vaping devices, such as e-cigarettes. If you need help quitting, ask your health care provider. Avoid secondhand smoke. Avoid exposure to radiation. Avoid exposure to radon gas. Have your home checked for radon regularly. Avoid things that cause cancer (carcinogens). Avoid living or working in places with high air pollution or diesel exhaust. Questions to ask your health care provider Am I eligible for lung cancer screening? Does my health insurance cover the cost of lung cancer screening? What happens if the lung cancer screening shows something of concern? How soon will I have results from my lung cancer screening? Is there anything that I need to do to prepare for my lung cancer screening? What happens if I decide not to have lung cancer screening? Where to find more information Ask your health care provider about the  risks and benefits of screening. More information and resources are available from these organizations: [place] (ACS): www.cancer.org American Lung Association: www.lung.[place]: www.cancer.gov Contact a health care provider if: You start to show symptoms of lung cancer, including: A cough that will not go away. High-pitched whistling sounds when you breathe, most often when you breathe out  (wheezing). Chest pain. Coughing up blood. Shortness of breath. Weight loss that cannot be explained. Constant tiredness (fatigue). Hoarse voice. Summary Lung cancer screening may find lung cancer before symptoms appear. Finding cancer early improves the chances of successful treatment. It may save your life. The recommended screening test is a low-dose computed tomography (LDCT) scan that looks for abnormal cells in the lungs. If you are at risk for lung cancer, it is recommended that you get screened once a year. You can make lifestyle changes to lower your risk of lung cancer. Ask your health care provider about the risks and benefits of screening. This information is not intended to replace advice given to you by your health care provider. Make sure you discuss any questions you have with your health care provider. Document Revised: 08/23/2020 Document Reviewed: 08/23/2020 Elsevier Patient Education  [place].

## 2021-08-22 ENCOUNTER — Other Ambulatory Visit: Payer: 59

## 2021-08-22 DIAGNOSIS — E782 Mixed hyperlipidemia: Secondary | ICD-10-CM

## 2021-08-22 LAB — CMP14+EGFR
ALT: 31 IU/L (ref 0–44)
AST: 36 IU/L (ref 0–40)
Albumin/Globulin Ratio: 1.7 (ref 1.2–2.2)
Albumin: 4.1 g/dL (ref 3.8–4.9)
Alkaline Phosphatase: 71 IU/L (ref 44–121)
BUN/Creatinine Ratio: 25 — ABNORMAL HIGH (ref 9–20)
BUN: 17 mg/dL (ref 6–24)
Bilirubin Total: 0.3 mg/dL (ref 0.0–1.2)
CO2: 20 mmol/L (ref 20–29)
Calcium: 9.2 mg/dL (ref 8.7–10.2)
Chloride: 101 mmol/L (ref 96–106)
Creatinine, Ser: 0.69 mg/dL — ABNORMAL LOW (ref 0.76–1.27)
Globulin, Total: 2.4 g/dL (ref 1.5–4.5)
Glucose: 81 mg/dL (ref 70–99)
Potassium: 4.5 mmol/L (ref 3.5–5.2)
Sodium: 138 mmol/L (ref 134–144)
Total Protein: 6.5 g/dL (ref 6.0–8.5)
eGFR: 108 mL/min/{1.73_m2} (ref >59)

## 2021-08-22 LAB — LIPID PANEL
Chol/HDL Ratio: 9.6 ratio — ABNORMAL HIGH (ref 0.0–5.0)
Cholesterol, Total: 260 mg/dL — ABNORMAL HIGH (ref 100–199)
HDL: 27 mg/dL — ABNORMAL LOW (ref >39)
Triglycerides: 1993 mg/dL (ref 0–149)

## 2021-08-22 LAB — CBC
Hematocrit: 40.8 % (ref 37.5–51.0)
Hemoglobin: 14.7 g/dL (ref 13.0–17.7)
MCH: 33.6 pg — ABNORMAL HIGH (ref 26.6–33.0)
MCHC: 36 g/dL — ABNORMAL HIGH (ref 31.5–35.7)
MCV: 93 fL (ref 79–97)
Platelets: 272 10*3/uL (ref 150–450)
RBC: 4.37 x10E6/uL (ref 4.14–5.80)
RDW: 12.9 % (ref 11.6–15.4)
WBC: 7.8 10*3/uL (ref 3.4–10.8)

## 2021-08-22 LAB — TSH: TSH: 3.46 u[IU]/mL (ref 0.450–4.500)

## 2021-08-22 LAB — PSA: Prostate Specific Ag, Serum: 3.5 ng/mL (ref 0.0–4.0)

## 2021-08-22 NOTE — Addendum Note (Signed)
Addended by: [name] on: 08/22/2021 01:37 PM   Modules accepted: Orders

## 2021-08-23 LAB — LIPID PANEL
Chol/HDL Ratio: 6.5 ratio — ABNORMAL HIGH (ref 0.0–5.0)
Cholesterol, Total: 234 mg/dL — ABNORMAL HIGH (ref 100–199)
HDL: 36 mg/dL — ABNORMAL LOW (ref >39)
Triglycerides: 981 mg/dL (ref 0–149)

## 2021-08-24 ENCOUNTER — Other Ambulatory Visit: Payer: Self-pay | Admitting: Family Medicine

## 2021-08-24 DIAGNOSIS — E782 Mixed hyperlipidemia: Secondary | ICD-10-CM

## 2021-08-24 MED ORDER — FENOFIBRATE 145 MG PO TABS: 145.0000 mg | ORAL_TABLET | Freq: Every day | ORAL | 3 refills | Status: DC

## 2021-08-24 NOTE — Progress Notes (Signed)
Patient calling back. Please return call.  

## 2021-08-24 NOTE — Progress Notes (Signed)
Patient calling to check on results. Please call back  

## 2021-09-04 ENCOUNTER — Ambulatory Visit: Payer: 59

## 2021-09-04 ENCOUNTER — Ambulatory Visit (INDEPENDENT_AMBULATORY_CARE_PROVIDER_SITE_OTHER): Payer: 59

## 2021-09-04 VITALS — BP 146/86 | HR 79

## 2021-09-04 DIAGNOSIS — I10 Essential (primary) hypertension: Secondary | ICD-10-CM

## 2021-09-04 NOTE — Progress Notes (Signed)
Patient presents to office today for a 2 week BP check. His reading today is 146/86 which is some improved from the last check.

## 2021-09-28 ENCOUNTER — Other Ambulatory Visit: Payer: Self-pay | Admitting: Family Medicine

## 2021-09-28 DIAGNOSIS — R0781 Pleurodynia: Secondary | ICD-10-CM

## 2021-10-01 ENCOUNTER — Encounter: Payer: Self-pay | Admitting: Internal Medicine

## 2021-10-07 ENCOUNTER — Other Ambulatory Visit: Payer: Self-pay | Admitting: Internal Medicine

## 2021-10-07 DIAGNOSIS — R110 Nausea: Secondary | ICD-10-CM

## 2021-10-08 ENCOUNTER — Encounter: Payer: Self-pay | Admitting: Internal Medicine

## 2021-10-08 NOTE — Telephone Encounter (Signed)
Please find out if patient is still needing Zofran.  Looks like we last spoke with him after having labs completed, he was feeling better.

## 2021-10-08 NOTE — Telephone Encounter (Signed)
Noted.  I will refill Zofran.  He needs to schedule an office visit for follow-up.   Stacey: Patient needs nonurgent in person office visit for follow-up.

## 2021-10-08 NOTE — Telephone Encounter (Signed)
Spoke to pt, he informed me that he is still having nauseate a couple times a week.

## 2021-10-08 NOTE — Telephone Encounter (Signed)
Noted. Informed pt.  

## 2021-10-31 ENCOUNTER — Other Ambulatory Visit: Payer: Self-pay | Admitting: Family Medicine

## 2021-10-31 DIAGNOSIS — R0781 Pleurodynia: Secondary | ICD-10-CM

## 2021-11-26 ENCOUNTER — Encounter: Payer: Self-pay | Admitting: Family Medicine

## 2021-11-26 ENCOUNTER — Ambulatory Visit: Payer: 59 | Admitting: Family Medicine

## 2021-11-26 NOTE — Progress Notes (Deleted)
Subjective: CC:HTN, HLD PCP: [name], DO QIO:NGEX H Seth Mcmillan is a 56 y.o. male presenting to clinic today for:  1. HTN, HLD ***   ROS: Per HPI  No Known Allergies Past Medical History:  Diagnosis Date   Anxiety    Arthritis    Depression    Fatty liver    GERD (gastroesophageal reflux disease)    Hyperlipidemia    Hypertension    IBS (irritable bowel syndrome)    Diarrhea type    Current Outpatient Medications:    amLODipine (NORVASC) 5 MG tablet, Take 1 tablet (5 mg total) by mouth daily., Disp: 90 tablet, Rfl: 3   Ascorbic Acid (VITAMIN C) 1000 MG tablet, Take 1,000 mg by mouth daily., Disp: , Rfl:    aspirin EC 81 MG tablet, Take 81 mg by mouth daily., Disp: , Rfl:    celecoxib (CELEBREX) 100 MG capsule, Take 1 capsule (100 mg total) by mouth 2 (two) times daily as needed for moderate pain., Disp: 180 capsule, Rfl: 1   cholecalciferol (VITAMIN D3) 25 MCG (1000 UT) tablet, Take 1,000 Units by mouth daily., Disp: , Rfl:    escitalopram (LEXAPRO) 10 MG tablet, Take 1 tablet (10 mg total) by mouth daily., Disp: 90 tablet, Rfl: 3   fenofibrate (TRICOR) 145 MG tablet, Take 1 tablet (145 mg total) by mouth daily. To REPLACE mg, Disp: 90 tablet, Rfl: 3   lidocaine (LIDODERM) 5 %, USE 1 PATCH EXTERNALLY ONCE DAILY AS NEEDED (FOR RIB PAIN) *REMOVE AND DISCARD PATCH WITHIN 12 HOURS OR AS DIRECTED BY MD*, Disp: 30 patch, Rfl: PRN   omeprazole (PRILOSEC) 40 MG capsule, Take 1 capsule (40 mg total) by mouth daily., Disp: 90 capsule, Rfl: 3   ondansetron (ZOFRAN) 4 MG tablet, TAKE 1 TABLET BY MOUTH EVERY 8 HOURS AS NEEDED FOR NAUSEA OR VOMITING, Disp: 30 tablet, Rfl: 0   rosuvastatin (CRESTOR) 40 MG tablet, Take 1 tablet (40 mg total) by mouth daily., Disp: 90 tablet, Rfl: 3   VITAMIN E PO, Take by mouth daily., Disp: , Rfl:  Social History   Socioeconomic History   Marital status: Divorced    Spouse name: Not on file   Number of children: Not on file   Years of  education: Not on file   Highest education level: Not on file  Occupational History   Not on file  Tobacco Use   Smoking status: Former    Packs/day: 1.00    Years: 25.00    Total pack years: 25.00    Types: Cigarettes    Quit date: 07/16/2017    Years since quitting: 4.3   Smokeless tobacco: Never  Vaping Use   Vaping Use: Never used  Substance and Sexual Activity   Alcohol use: Yes    Alcohol/week: 12.0 standard drinks of alcohol    Types: 12 Cans of beer per week    Comment: 12 cans of beer/week, mostly on weekends.   Drug use: No   Sexual activity: Not on file  Other Topics Concern   Not on file  Social History Narrative   Works at a cigarette factory   Social Determinants of Radio broadcast assistant Strain: Not on file  Food Insecurity: Not on file  Transportation Needs: Not on file  Physical Activity: Not on file  Stress: Not on file  Social Connections: Not on file  Intimate Partner Violence: Not on file   Family History  Problem Relation Age of Onset  Heart disease Mother    Dementia Mother    Heart disease Father    Diabetes Brother    Colon cancer Neg Hx     Objective: Office vital signs reviewed. There were no vitals taken for this visit.  Physical Examination:  General: Awake, alert, *** nourished, No acute distress HEENT: Normal    Neck: No masses palpated. No lymphadenopathy    Ears: Tympanic membranes intact, normal light reflex, no erythema, no bulging    Eyes: PERRLA, extraocular membranes intact, sclera ***    Nose: nasal turbinates moist, *** nasal discharge    Throat: moist mucus membranes, no erythema, *** tonsillar exudate.  Airway is patent Cardio: regular rate and rhythm, S1S2 heard, no murmurs appreciated Pulm: clear to auscultation bilaterally, no wheezes, rhonchi or rales; normal work of breathing on room air GI: soft, non-tender, non-distended, bowel sounds present x4, no hepatomegaly, no splenomegaly, no masses GU: external  vaginal tissue ***, cervix ***, *** punctate lesions on cervix appreciated, *** discharge from cervical os, *** bleeding, *** cervical motion tenderness, *** abdominal/ adnexal masses Extremities: warm, well perfused, No edema, cyanosis or clubbing; +*** pulses bilaterally MSK: *** gait and *** station Skin: dry; intact; no rashes or lesions Neuro: *** Strength and light touch sensation grossly intact, *** DTRs ***/4  Assessment/ Plan: 56 y.o. male   ***  No orders of the defined types were placed in this encounter.  No orders of the defined types were placed in this encounter.    [name], DO [place] [phone]

## 2021-12-24 ENCOUNTER — Ambulatory Visit: Payer: 59 | Admitting: Family Medicine

## 2021-12-26 ENCOUNTER — Ambulatory Visit (INDEPENDENT_AMBULATORY_CARE_PROVIDER_SITE_OTHER): Payer: 59 | Admitting: Family Medicine

## 2021-12-26 ENCOUNTER — Ambulatory Visit: Payer: 59

## 2021-12-26 ENCOUNTER — Encounter: Payer: Self-pay | Admitting: Family Medicine

## 2021-12-26 VITALS — BP 146/89 | HR 79 | Temp 97.6°F | Ht 71.0 in | Wt 178.4 lb

## 2021-12-26 DIAGNOSIS — L03115 Cellulitis of right lower limb: Secondary | ICD-10-CM | POA: Diagnosis not present

## 2021-12-26 DIAGNOSIS — B880 Other acariasis: Secondary | ICD-10-CM

## 2021-12-26 DIAGNOSIS — L03116 Cellulitis of left lower limb: Secondary | ICD-10-CM

## 2021-12-26 MED ORDER — TRIAMCINOLONE ACETONIDE 0.1 % EX CREA: 1.0000 | TOPICAL_CREAM | Freq: Two times a day (BID) | CUTANEOUS | 0 refills | Status: DC

## 2021-12-26 MED ORDER — DOXYCYCLINE HYCLATE 100 MG PO TABS: 100.0000 mg | ORAL_TABLET | Freq: Two times a day (BID) | ORAL | 0 refills | Status: AC

## 2021-12-26 NOTE — Progress Notes (Addendum)
Subjective:  Patient ID: Seth Mcmillan, male    DOB: [birth year], 56 y.o.   MRN: [id number]  Patient Care Team: [name], DO as PCP - General (Family Medicine) Danie Binder, MD (Inactive) as Consulting Physician (Gastroenterology)   Chief Complaint:  chiggers (Patient states he got into chiggers x 1 week ago. ) and Rash   HPI: Seth Mcmillan is a 56 y.o. male presenting on 12/26/2021 for chiggers (Patient states he got into chiggers x 1 week ago. ) and Rash   Pt reports he was out in the woods last week and got exposed to chiggers, states they have been very pruritic so he has been scratching them a lot in his sleep. Now areas are swollen, red, and painful.   Rash This is a new problem. Episode onset: 1 week ago. The problem has been gradually worsening since onset. The affected locations include the torso, left foot, left ankle, left lower leg, left upper leg, right foot, right ankle, right lower leg, right upper leg, right toes and left toes. The rash is characterized by itchiness, pain, redness and swelling. Associated with: chiggers. Pertinent negatives include no anorexia, congestion, cough, diarrhea, eye pain, facial edema, fatigue, fever, joint pain, nail changes, rhinorrhea, shortness of breath, sore throat or vomiting. Past treatments include anti-itch cream. The treatment provided no relief.       Relevant past medical, surgical, family, and social history reviewed and updated as indicated.  Allergies and medications reviewed and updated. Data reviewed: Chart in Epic.   Past Medical History:  Diagnosis Date   Anxiety    Arthritis    Depression    Fatty liver    GERD (gastroesophageal reflux disease)    Hyperlipidemia    Hypertension    IBS (irritable bowel syndrome)    Diarrhea type    Past Surgical History:  Procedure Laterality Date   ANTERIOR CERVICAL DECOMP/DISCECTOMY FUSION N/A 05/18/2018   Procedure: C6-7. C7-T1 ANTERIOR CERVICAL  DECOMPRESSION/DISCECTOMY FUSION, ALLOGRAFT, PLATE;  Surgeon: Marybelle Killings, MD;  Location: [facility];  Service: Orthopedics;  Laterality: N/A;   BIOPSY  09/05/2015   Procedure: BIOPSY;  Surgeon: Danie Binder, MD;  [facility];  Service: Endoscopy;;  random colon bx   BIOPSY  05/25/2019   Procedure: BIOPSY;  Surgeon: Danie Binder, MD;  [facility];  Service: Endoscopy;;   COLONOSCOPY N/A 09/05/2015   Dr. Oneida Alar: 4 simple adenomas removed.  Next colonoscopy 3 years.   COLONOSCOPY WITH PROPOFOL N/A 05/25/2019   Procedure: COLONOSCOPY WITH PROPOFOL;  Surgeon: Danie Binder, MD;  [facility];  Service: Endoscopy;  Laterality: N/A;  12:15pm   ESOPHAGOGASTRODUODENOSCOPY (EGD) WITH PROPOFOL N/A 05/25/2019   Procedure: ESOPHAGOGASTRODUODENOSCOPY (EGD) WITH PROPOFOL;  Surgeon: Danie Binder, MD;  [facility];  Service: Endoscopy;  Laterality: N/A;   KNEE SURGERY Right 1997   bursa sac   POLYPECTOMY  09/05/2015   Procedure: POLYPECTOMY;  Surgeon: Danie Binder, MD;  [facility];  Service: Endoscopy;;  colon polyps   SHOULDER ARTHROSCOPY WITH BICEPS TENDON REPAIR Right 2011   TONSILECTOMY, ADENOIDECTOMY, BILATERAL MYRINGOTOMY AND TUBES      Social History   Socioeconomic History   Marital status: Divorced    Spouse name: Not on file   Number of children: Not on file   Years of education: Not on file   Highest education level: Not on file  Occupational History  Not on file  Tobacco Use   Smoking status: Former    Packs/day: 1.00    Years: 25.00    Total pack years: 25.00    Types: Cigarettes    Quit date: 07/16/2017    Years since quitting: 4.4   Smokeless tobacco: Never  Vaping Use   Vaping Use: Never used  Substance and Sexual Activity   Alcohol use: Yes    Alcohol/week: 12.0 standard drinks of alcohol    Types: 12 Cans of beer per week    Comment: 12 cans of beer/week, mostly on weekends.   Drug use: No   Sexual activity: Not  on file  Other Topics Concern   Not on file  Social History Narrative   Works at a [employer] Strain: Not on file  Food Insecurity: Not on file  Transportation Needs: Not on file  Physical Activity: Not on file  Stress: Not on file  Social Connections: Not on file  Intimate Partner Violence: Not on file    Outpatient Encounter Medications as of 12/26/2021  Medication Sig   amLODipine (NORVASC) 5 MG tablet Take 1 tablet (5 mg total) by mouth daily.   Ascorbic Acid (VITAMIN C) 1000 MG tablet Take 1,000 mg by mouth daily.   aspirin EC 81 MG tablet Take 81 mg by mouth daily.   celecoxib (CELEBREX) 100 MG capsule Take 1 capsule (100 mg total) by mouth 2 (two) times daily as needed for moderate pain.   cholecalciferol (VITAMIN D3) 25 MCG (1000 UT) tablet Take 1,000 Units by mouth daily.   doxycycline (VIBRA-TABS) 100 MG tablet Take 1 tablet (100 mg total) by mouth 2 (two) times daily for 10 days. 1 po bid   escitalopram (LEXAPRO) 10 MG tablet Take 1 tablet (10 mg total) by mouth daily.   fenofibrate (TRICOR) 145 MG tablet Take 1 tablet (145 mg total) by mouth daily. To REPLACE mg   lidocaine (LIDODERM) 5 % USE 1 PATCH EXTERNALLY ONCE DAILY AS NEEDED (FOR RIB PAIN) *REMOVE AND DISCARD PATCH WITHIN 12 HOURS OR AS DIRECTED BY MD*   omeprazole (PRILOSEC) 40 MG capsule Take 1 capsule (40 mg total) by mouth daily.   ondansetron (ZOFRAN) 4 MG tablet TAKE 1 TABLET BY MOUTH EVERY 8 HOURS AS NEEDED FOR NAUSEA OR VOMITING   rosuvastatin (CRESTOR) 40 MG tablet Take 1 tablet (40 mg total) by mouth daily.   triamcinolone cream (KENALOG) 0.1 % Apply 1 Application topically 2 (two) times daily.   VITAMIN E PO Take by mouth daily.   No facility-administered encounter medications on file as of 12/26/2021.    No Known Allergies  Review of Systems  Constitutional:  Negative for activity change, appetite change, chills, fatigue and fever.   HENT: Negative.  Negative for congestion, rhinorrhea and sore throat.   Eyes: Negative.  Negative for pain.  Respiratory:  Negative for cough, chest tightness and shortness of breath.   Cardiovascular:  Negative for chest pain, palpitations and leg swelling.  Gastrointestinal:  Negative for anorexia, blood in stool, constipation, diarrhea, nausea and vomiting.  Endocrine: Negative.   Genitourinary:  Negative for decreased urine volume, difficulty urinating, dysuria, frequency and urgency.  Musculoskeletal:  Negative for arthralgias, joint pain and myalgias.  Skin:  Positive for color change, rash and wound. Negative for nail changes and pallor.  Allergic/Immunologic: Negative.   Neurological:  Negative for dizziness and headaches.  Hematological: Negative.   Psychiatric/Behavioral:  Negative for confusion, hallucinations, sleep disturbance and suicidal ideas.   All other systems reviewed and are negative.       Objective:  BP (!) 146/89   Pulse 79   Temp 97.6 F (36.4 C) (Temporal)   Ht '5\' 11"'$  (1.803 m)   Wt 178 lb 6.4 oz (80.9 kg)   SpO2 99%   BMI 24.88 kg/m    Wt Readings from Last 3 Encounters:  12/26/21 178 lb 6.4 oz (80.9 kg)  08/21/21 187 lb (84.8 kg)  05/31/21 185 lb (83.9 kg)    Physical Exam Vitals and nursing note reviewed.  Constitutional:      General: He is not in acute distress.    Appearance: Normal appearance. He is well-developed and well-groomed. He is not ill-appearing, toxic-appearing or diaphoretic.  HENT:     Head: Normocephalic and atraumatic.     Jaw: There is normal jaw occlusion.     Right Ear: Hearing normal.     Left Ear: Hearing normal.     Nose: Nose normal.     Mouth/Throat:     Lips: Pink.     Mouth: Mucous membranes are moist.     Pharynx: Uvula midline.  Eyes:     General: Lids are normal.     Pupils: Pupils are equal, round, and reactive to light.  Neck:     Thyroid: No thyroid mass, thyromegaly or thyroid tenderness.      Vascular: No carotid bruit or JVD.     Trachea: Trachea and phonation normal.  Cardiovascular:     Rate and Rhythm: Normal rate and regular rhythm.     Chest Wall: PMI is not displaced.     Pulses: Normal pulses.     Heart sounds: Normal heart sounds. No murmur heard.    No friction rub. No gallop.  Pulmonary:     Effort: Pulmonary effort is normal.     Breath sounds: Normal breath sounds.  Abdominal:     General: There is no abdominal bruit.     Palpations: There is no hepatomegaly or splenomegaly.  Musculoskeletal:        General: Normal range of motion.     Cervical back: Normal range of motion and neck supple.     Right lower leg: No edema.     Left lower leg: No edema.  Lymphadenopathy:     Cervical: No cervical adenopathy.  Skin:    General: Skin is warm and dry.     Capillary Refill: Capillary refill takes less than 2 seconds.     Coloration: Skin is not cyanotic, jaundiced or pale.     Findings: Erythema and rash present.     Comments: Multiple red papules to bilateral lower extremities and trunk. Areas to bilateral feet and lower extremities with swelling and surrounding erythema.  Neurological:     General: No focal deficit present.     Mental Status: He is alert and oriented to person, place, and time.     Sensory: Sensation is intact.     Motor: Motor function is intact.     Coordination: Coordination is intact.     Gait: Gait is intact.     Deep Tendon Reflexes: Reflexes are normal and symmetric.  Psychiatric:        Attention and Perception: Attention and perception normal.        Mood and Affect: Mood and affect normal.        Speech: Speech normal.  Behavior: Behavior normal. Behavior is cooperative.        Thought Content: Thought content normal.        Cognition and Memory: Cognition and memory normal.        Judgment: Judgment normal.     Results for orders placed or performed in visit on 08/22/21  Lipid panel  Result Value Ref Range    Cholesterol, Total 234 (H) 100 - 199 mg/dL   Triglycerides 981 (HH) 0 - 149 mg/dL   HDL 36 (L) >39 mg/dL   VLDL Cholesterol Cal Comment (A) 5 - 40 mg/dL   LDL Chol Calc (NIH) Comment (A) 0 - 99 mg/dL   Chol/HDL Ratio 6.5 (H) 0.0 - 5.0 ratio       Pertinent labs & imaging results that were available during my care of the patient were reviewed by me and considered in my medical decision making.  Assessment & Plan:  Vidal was seen today for chiggers and rash.  Diagnoses and all orders for this visit:  Chigger bites Below as needed for pruritis.  -     triamcinolone cream (KENALOG) 0.1 %; Apply 1 Application topically 2 (two) times daily.  Cellulitis of left lower extremity Cellulitis of right lower extremity Wound care discussed in detail. Medications as prescribed. Report new, worsening, or persistent symptoms.  -     doxycycline (VIBRA-TABS) 100 MG tablet; Take 1 tablet (100 mg total) by mouth 2 (two) times daily for 10 days. 1 po bid     Continue all other maintenance medications.  Follow up plan: Return if symptoms worsen or fail to improve.   Continue healthy lifestyle choices, including diet (rich in fruits, vegetables, and lean proteins, and low in salt and simple carbohydrates) and exercise (at least 30 minutes of moderate physical activity daily).  Educational handout given for cellulitis  The above assessment and management plan was discussed with the patient. The patient verbalized understanding of and has agreed to the management plan. Patient is aware to call the clinic if they develop any new symptoms or if symptoms persist or worsen. Patient is aware when to return to the clinic for a follow-up visit. Patient educated on when it is appropriate to go to the emergency department.   Monia Pouch, FNP-C [facility] [phone]

## 2022-01-21 ENCOUNTER — Encounter: Payer: Self-pay | Admitting: Family Medicine

## 2022-01-21 ENCOUNTER — Ambulatory Visit: Payer: 59 | Admitting: Family Medicine

## 2022-01-21 VITALS — BP 152/94 | HR 60 | Temp 98.3°F | Ht 71.0 in | Wt 181.2 lb

## 2022-01-21 DIAGNOSIS — E782 Mixed hyperlipidemia: Secondary | ICD-10-CM | POA: Diagnosis not present

## 2022-01-21 DIAGNOSIS — I10 Essential (primary) hypertension: Secondary | ICD-10-CM | POA: Diagnosis not present

## 2022-01-21 NOTE — Progress Notes (Signed)
I have separately seen and examined the patient. I have discussed the findings and exam with student Dr Jerline Pain and agree with the below note.  My changes/additions are outlined in BLUE.    S: Patient reports that he has been doing relatively well.  Compliant with cholesterol medication.  He did not take his amlodipine this morning because he was try to come in fasting.  His home blood pressures typically are 130s to 140s over 80s.  No chest pain, shortness of breath.  No intolerance to the medication to his knowledge.  He tries to watch what he eats for the most part because of history of IBS.  He uses Zofran intermittently for these flareups but is not on any other medication besides Imodium.  Should be scheduling follow-up with GI soon.  O: Vitals:   01/21/22 0936 01/21/22 0959  BP: (!) 157/90 (!) 152/94  Pulse: 60   Temp: 98.3 F (36.8 C)   SpO2: 95%     General appearance: alert, cooperative, appears stated age, and no distress Eyes: negative findings: lids and lashes normal and conjunctivae and sclerae normal Lungs: clear to auscultation bilaterally Heart: regular rate and rhythm, S1, S2 normal, no murmur, click, rub or gallop Abdomen:  Mild epigastric tenderness palpation present.  No splenomegaly.  No guarding.  Liver felt 1 fingerbreadth below rib   A/P:  Mixed hyperlipidemia - Plan: Lipid Panel, CMP14+EGFR  Essential hypertension - Plan: CMP14+EGFR  Check fasting lipid, CMP.  Continue statin at current dose for now  Blood pressure is not controlled but he has not taken his blood pressure medication today.  He will take this and return in 2 weeks for blood pressure check with nurse and in 3 months with me.  May need to consider advancing Norvasc 10 mg daily if persistently above 140s over 90s  Michon Kaczmarek M. Lajuana Ripple, [facility]   -------------------------------------------------------------------------------------------------------------------------------------------------------------------------------------     Subjective: ES:[id number] re-check PCP: [name], DO JFH:LKTG H Toledo is a 56 y.o. male presenting to clinic today for:  Hypertension  Hyperlipidemia  Patient continues to be compliant with his medications, including amlodipine, aspirin, fenofibrate, and Crestor. Does not report chest pain or shortness of breath, or lower extremity edema. He reports eating a well-balanced diet, watching what he eats due to his IBS symptoms. Is active at work and at home. Has not been checking blood pressure medications at home but does check them at [facility]. Reports that he blood pressures range from 130-140/80-85 at [facility].   Rash  Previously noted rash secondary to chiggers improved with triamcinolone cream  Depression Symptoms are stable. Takes Lexapro. No issues with medication.   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 Pain is well-controlled with medications, stable at this time.   IBS  GERD Patient reports ongoing IBS symptoms, for which he sees GI for management. Reports no ongoing vomiting, does notice increased diarrheal symptoms with increased stress. Takes Zofran as-needed for nausea, and omeprazole for GERD symptoms.   ROS: Per HPI  No Known Allergies Past Medical History:  Diagnosis Date   Anxiety    Arthritis    Depression    Fatty liver    GERD (gastroesophageal reflux disease)    Hyperlipidemia    Hypertension    IBS (irritable bowel syndrome)    Diarrhea type    Current Outpatient Medications:    amLODipine (NORVASC) 5 MG tablet, Take 1 tablet (5 mg total)  by mouth daily., Disp: 90 tablet, Rfl: 3   Ascorbic Acid (VITAMIN C) 1000 MG tablet, Take 1,000 mg by mouth daily., Disp: , Rfl:    aspirin  EC 81 MG tablet, Take 81 mg by mouth daily., Disp: , Rfl:    celecoxib (CELEBREX) 100 MG capsule, Take 1 capsule (100 mg total) by mouth 2 (two) times daily as needed for moderate pain., Disp: 180 capsule, Rfl: 1   cholecalciferol (VITAMIN D3) 25 MCG (1000 UT) tablet, Take 1,000 Units by mouth daily., Disp: , Rfl:    escitalopram (LEXAPRO) 10 MG tablet, Take 1 tablet (10 mg total) by mouth daily., Disp: 90 tablet, Rfl: 3   fenofibrate (TRICOR) 145 MG tablet, Take 1 tablet (145 mg total) by mouth daily. To REPLACE mg, Disp: 90 tablet, Rfl: 3   lidocaine (LIDODERM) 5 %, USE 1 PATCH EXTERNALLY ONCE DAILY AS NEEDED (FOR RIB PAIN) *REMOVE AND DISCARD PATCH WITHIN 12 HOURS OR AS DIRECTED BY MD*, Disp: 30 patch, Rfl: PRN   omeprazole (PRILOSEC) 40 MG capsule, Take 1 capsule (40 mg total) by mouth daily., Disp: 90 capsule, Rfl: 3   ondansetron (ZOFRAN) 4 MG tablet, TAKE 1 TABLET BY MOUTH EVERY 8 HOURS AS NEEDED FOR NAUSEA OR VOMITING, Disp: 30 tablet, Rfl: 0   rosuvastatin (CRESTOR) 40 MG tablet, Take 1 tablet (40 mg total) by mouth daily., Disp: 90 tablet, Rfl: 3   triamcinolone cream (KENALOG) 0.1 %, Apply 1 Application topically 2 (two) times daily., Disp: 30 g, Rfl: 0   VITAMIN E PO, Take by mouth daily., Disp: , Rfl:  Social History   Socioeconomic History   Marital status: Divorced    Spouse name: Not on file   Number of children: Not on file   Years of education: Not on file   Highest education level: Not on file  Occupational History   Not on file  Tobacco Use   Smoking status: Former    Packs/day: 1.00    Years: 25.00    Total pack years: 25.00    Types: Cigarettes    Quit date: 07/16/2017    Years since quitting: 4.5   Smokeless tobacco: Never  Vaping Use   Vaping Use: Never used  Substance and Sexual Activity   Alcohol use: Yes    Alcohol/week: 12.0 standard drinks of alcohol    Types: 12 Cans of beer per week    Comment: 12 cans of beer/week, mostly on weekends.   Drug use: No    Sexual activity: Not on file  Other Topics Concern   Not on file  Social History Narrative   Works at a cigarette factory   Social Determinants of Radio broadcast assistant Strain: Not on file  Food Insecurity: Not on file  Transportation Needs: Not on file  Physical Activity: Not on file  Stress: Not on file  Social Connections: Not on file  Intimate Partner Violence: Not on file   Family History  Problem Relation Age of Onset   Heart disease Mother    Dementia Mother    Heart disease Father    Diabetes Brother    Colon cancer Neg Hx     Objective: Office vital signs reviewed. BP (!) 152/94   Pulse 60   Temp 98.3 F (36.8 C)   Ht _0  (1.803 m)   Wt 181 lb 3.2 oz (82.2 kg)   SpO2 95%   BMI 25.27 kg/m   Physical Examination:  General: Awake, alert, well  nourished, No acute distress HEENT: Normal    Neck: No masses palpated. No lymphadenopathy    Ears: Tympanic membranes intact, normal light reflex, no erythema, no bulging    Eyes: PERRLA, extraocular membranes intact, sclera white    Nose: nasal turbinates moist, no nasal discharge    Throat: moist mucus membranes, no erythema, no tonsillar exudate.  Airway is patent Cardio: regular rate and rhythm, S1S2 heard, no murmurs appreciated Pulm: clear to auscultation bilaterally, no wheezes, rhonchi or rales; normal work of breathing on room air GI: soft, non-tender, non-distended, bowel sounds present x4, no hepatomegaly, no splenomegaly, no masses Extremities: warm, well perfused, No edema, cyanosis or clubbing; +1 pulses bilaterally Skin: dry; intact; no rashes or lesions  Assessment/ Plan: 56 y.o. male   Patient reports blood pressures within the range of 130-140/80-85 when checking blood pressures at [employer]. Given hypertensive blood pressure levels in the office and high systolic readings in the community, I'd like to increase the patient's amlodipine to 10 mg per day. Of note, the patient did not take his  blood pressure medications before his appointment today. The patient was instructed to return in 2 weeks for a blood pressure re-check. He will record [facility] blood pressure levels before the next visit. We also plan to check the lipid panel and CMP today.  Rash, MSK pain, and depression are stable at this time. The patient sees a GI specialist for IBS management.  Orders Placed This Encounter  Procedures   Lipid Panel   CMP14+EGFR   No orders of the defined types were placed in this encounter.  Stephani Police, MS3

## 2022-01-22 LAB — LIPID PANEL
Chol/HDL Ratio: 2.1 ratio (ref 0.0–5.0)
Cholesterol, Total: 154 mg/dL (ref 100–199)
HDL: 73 mg/dL (ref >39)
LDL Chol Calc (NIH): 52 mg/dL (ref 0–99)
Triglycerides: 182 mg/dL — ABNORMAL HIGH (ref 0–149)
VLDL Cholesterol Cal: 29 mg/dL (ref 5–40)

## 2022-01-22 LAB — CMP14+EGFR
ALT: 27 IU/L (ref 0–44)
AST: 35 IU/L (ref 0–40)
Albumin/Globulin Ratio: 2 (ref 1.2–2.2)
Albumin: 4.4 g/dL (ref 3.8–4.9)
Alkaline Phosphatase: 63 IU/L (ref 44–121)
BUN/Creatinine Ratio: 15 (ref 9–20)
BUN: 16 mg/dL (ref 6–24)
Bilirubin Total: <0.2 mg/dL (ref 0.0–1.2)
CO2: 20 mmol/L (ref 20–29)
Calcium: 9.1 mg/dL (ref 8.7–10.2)
Chloride: 104 mmol/L (ref 96–106)
Creatinine, Ser: 1.06 mg/dL (ref 0.76–1.27)
Globulin, Total: 2.2 g/dL (ref 1.5–4.5)
Glucose: 92 mg/dL (ref 70–99)
Potassium: 4.3 mmol/L (ref 3.5–5.2)
Sodium: 141 mmol/L (ref 134–144)
Total Protein: 6.6 g/dL (ref 6.0–8.5)
eGFR: 81 mL/min/{1.73_m2} (ref >59)

## 2022-01-30 ENCOUNTER — Encounter: Payer: Self-pay | Admitting: Family Medicine

## 2022-02-05 ENCOUNTER — Ambulatory Visit (INDEPENDENT_AMBULATORY_CARE_PROVIDER_SITE_OTHER): Payer: 59 | Admitting: *Deleted

## 2022-02-05 VITALS — BP 136/80 | HR 75

## 2022-02-05 DIAGNOSIS — I10 Essential (primary) hypertension: Secondary | ICD-10-CM

## 2022-02-05 NOTE — Progress Notes (Signed)
Perfect reading

## 2022-02-20 ENCOUNTER — Ambulatory Visit: Payer: 59 | Admitting: Family Medicine

## 2022-03-26 ENCOUNTER — Other Ambulatory Visit: Payer: Self-pay

## 2022-03-26 DIAGNOSIS — Z87891 Personal history of nicotine dependence: Secondary | ICD-10-CM

## 2022-03-26 DIAGNOSIS — Z122 Encounter for screening for malignant neoplasm of respiratory organs: Secondary | ICD-10-CM

## 2022-03-26 NOTE — Progress Notes (Signed)
Received referral for initial lung cancer screening scan. Contacted patient and obtained smoking history (started age 57, current smoker having quit 3 years ago, patient smoked 1PPD for the majority of his smoking history but did quit for approximately 5 years between the ages of 58 and 44, 20 pack year) as well as answering questions related to the screening process. Patient denies signs/symptoms of lung cancer such as weight loss or hemoptysis. Patient denies comorbidity that would prevent curative treatment if lung cancer were to be found. Patient is scheduled for shared decision making visit and CT scan on 04/30/2022 at 1545.

## 2022-03-26 NOTE — Progress Notes (Signed)
LDCT order placed per protocol °

## 2022-03-26 NOTE — Progress Notes (Signed)
[facility] referral received. I have attempted to reach the patient regarding referral but was unable to reach the patient directly. Detailed VM left asking that the patient return my call. 

## 2022-04-03 ENCOUNTER — Encounter: Payer: Self-pay | Admitting: Internal Medicine

## 2022-04-03 ENCOUNTER — Ambulatory Visit (INDEPENDENT_AMBULATORY_CARE_PROVIDER_SITE_OTHER): Payer: 59 | Admitting: Internal Medicine

## 2022-04-03 VITALS — BP 147/89 | HR 80 | Temp 97.9°F | Ht 71.0 in | Wt 188.3 lb

## 2022-04-03 DIAGNOSIS — K219 Gastro-esophageal reflux disease without esophagitis: Secondary | ICD-10-CM

## 2022-04-03 DIAGNOSIS — I10 Essential (primary) hypertension: Secondary | ICD-10-CM | POA: Diagnosis not present

## 2022-04-03 DIAGNOSIS — K580 Irritable bowel syndrome with diarrhea: Secondary | ICD-10-CM | POA: Diagnosis not present

## 2022-04-03 NOTE — Progress Notes (Addendum)
Referring Provider: [name], DO Primary Care Physician:  [name], DO Primary GI:  Dr. Abbey Chatters  Chief Complaint  Patient presents with   Gastroesophageal Reflux    Follow up on IBS and GERD. Doing well and just needed refills on omeprazole and zofran today.    HPI:   Seth Mcmillan is a 57 y.o. male who presents to clinic today for follow-up visit.  He has a history of irritable bowel syndrome diarrhea predominant. TTG/IGA WNL.   Today he states he is doing okay.  IBS is relatively well controlled on Imodium.  Does have flares on occasion requiring missed work.  Colonoscopy 05/25/2019 unremarkable with a 5-year recall due to surveillance.  EGD at the same time showed mild gastritis, biopsies negative for H. pylori.  Did have a Schatzki's ring.  Has undergone 2 courses of Xifaxan in the past as well.  Past Medical History:  Diagnosis Date   Anxiety    Arthritis    Depression    Fatty liver    GERD (gastroesophageal reflux disease)    Hyperlipidemia    Hypertension    IBS (irritable bowel syndrome)    Diarrhea type    Past Surgical History:  Procedure Laterality Date   ANTERIOR CERVICAL DECOMP/DISCECTOMY FUSION N/A 05/18/2018   Procedure: C6-7. C7-T1 ANTERIOR CERVICAL DECOMPRESSION/DISCECTOMY FUSION, ALLOGRAFT, PLATE;  Surgeon: Marybelle Killings, MD;  Location: [facility];  Service: Orthopedics;  Laterality: N/A;   BIOPSY  09/05/2015   Procedure: BIOPSY;  Surgeon: Danie Binder, MD;  [facility];  Service: Endoscopy;;  random colon bx   BIOPSY  05/25/2019   Procedure: BIOPSY;  Surgeon: Danie Binder, MD;  [facility];  Service: Endoscopy;;   COLONOSCOPY N/A 09/05/2015   Dr. Oneida Alar: 4 simple adenomas removed.  Next colonoscopy 3 years.   COLONOSCOPY WITH PROPOFOL N/A 05/25/2019   Procedure: COLONOSCOPY WITH PROPOFOL;  Surgeon: Danie Binder, MD;  [facility];  Service: Endoscopy;  Laterality: N/A;  12:15pm    ESOPHAGOGASTRODUODENOSCOPY (EGD) WITH PROPOFOL N/A 05/25/2019   Procedure: ESOPHAGOGASTRODUODENOSCOPY (EGD) WITH PROPOFOL;  Surgeon: Danie Binder, MD;  [facility];  Service: Endoscopy;  Laterality: N/A;   KNEE SURGERY Right 1997   bursa sac   POLYPECTOMY  09/05/2015   Procedure: POLYPECTOMY;  Surgeon: Danie Binder, MD;  [facility];  Service: Endoscopy;;  colon polyps   SHOULDER ARTHROSCOPY WITH BICEPS TENDON REPAIR Right 2011   TONSILECTOMY, ADENOIDECTOMY, BILATERAL MYRINGOTOMY AND TUBES      Current Outpatient Medications  Medication Sig Dispense Refill   amLODipine (NORVASC) 5 MG tablet Take 1 tablet (5 mg total) by mouth daily. 90 tablet 3   Ascorbic Acid (VITAMIN C) 1000 MG tablet Take 1,000 mg by mouth daily.     aspirin EC 81 MG tablet Take 81 mg by mouth daily.     celecoxib (CELEBREX) 100 MG capsule Take 1 capsule (100 mg total) by mouth 2 (two) times daily as needed for moderate pain. 180 capsule 1   cholecalciferol (VITAMIN D3) 25 MCG (1000 UT) tablet Take 1,000 Units by mouth daily.     escitalopram (LEXAPRO) 10 MG tablet Take 1 tablet (10 mg total) by mouth daily. 90 tablet 3   fenofibrate (TRICOR) 145 MG tablet Take 1 tablet (145 mg total) by mouth daily. To REPLACE mg 90 tablet 3   lidocaine (LIDODERM) 5 % USE 1 PATCH EXTERNALLY ONCE DAILY  AS NEEDED (FOR RIB PAIN) *REMOVE AND DISCARD PATCH WITHIN 12 HOURS OR AS DIRECTED BY MD* 30 patch PRN   omeprazole (PRILOSEC) 40 MG capsule Take 1 capsule (40 mg total) by mouth daily. 90 capsule 3   ondansetron (ZOFRAN) 4 MG tablet TAKE 1 TABLET BY MOUTH EVERY 8 HOURS AS NEEDED FOR NAUSEA OR VOMITING 30 tablet 0   rosuvastatin (CRESTOR) 40 MG tablet Take 1 tablet (40 mg total) by mouth daily. 90 tablet 3   triamcinolone cream (KENALOG) 0.1 % Apply 1 Application topically 2 (two) times daily. 30 g 0   VITAMIN E PO Take by mouth daily.     No current facility-administered medications for this visit.    Allergies  as of 04/03/2022   (No Known Allergies)    Family History  Problem Relation Age of Onset   Heart disease Mother    Dementia Mother    Heart disease Father    Diabetes Brother    Colon cancer Neg Hx     Social History   Socioeconomic History   Marital status: Divorced    Spouse name: Not on file   Number of children: Not on file   Years of education: Not on file   Highest education level: Not on file  Occupational History   Not on file  Tobacco Use   Smoking status: Former    Packs/day: 1.00    Years: 25.00    Total pack years: 25.00    Types: Cigarettes    Quit date: 07/16/2017    Years since quitting: 4.7    Passive exposure: Past   Smokeless tobacco: Never  Vaping Use   Vaping Use: Never used  Substance and Sexual Activity   Alcohol use: Yes    Alcohol/week: 12.0 standard drinks of alcohol    Types: 12 Cans of beer per week    Comment: 12 cans of beer/week, mostly on weekends.   Drug use: No   Sexual activity: Not on file  Other Topics Concern   Not on file  Social History Narrative   Works at a [employer] Strain: Not on file  Food Insecurity: Not on file  Transportation Needs: Not on file  Physical Activity: Not on file  Stress: Not on file  Social Connections: Not on file    Subjective: Review of Systems  Constitutional:  Negative for chills and fever.  HENT:  Negative for congestion and hearing loss.   Eyes:  Negative for blurred vision and double vision.  Respiratory:  Negative for cough and shortness of breath.   Cardiovascular:  Negative for chest pain and palpitations.  Gastrointestinal:  Positive for diarrhea and nausea. Negative for abdominal pain, blood in stool, constipation, heartburn, melena and vomiting.  Genitourinary:  Negative for dysuria and urgency.  Musculoskeletal:  Negative for joint pain and myalgias.  Skin:  Negative for itching and rash.  Neurological:  Negative for  dizziness and headaches.  Psychiatric/Behavioral:  Negative for depression. The patient is not nervous/anxious.      Objective: BP (!) 147/89 (BP Location: Left Arm, Patient Position: Sitting, Cuff Size: Large) Comment: recheck 146/93  Pulse 80   Temp 97.9 F (36.6 C) (Oral)   Ht '5\' 11"'$  (1.803 m)   Wt 188 lb 4.8 oz (85.4 kg)   BMI 26.26 kg/m  Physical Exam Constitutional:      Appearance: Normal appearance.  HENT:     Head: Normocephalic and  atraumatic.  Eyes:     Extraocular Movements: Extraocular movements intact.     Conjunctiva/sclera: Conjunctivae normal.  Cardiovascular:     Rate and Rhythm: Normal rate and regular rhythm.  Pulmonary:     Effort: Pulmonary effort is normal.     Breath sounds: Normal breath sounds.  Abdominal:     General: Bowel sounds are normal.     Palpations: Abdomen is soft.  Musculoskeletal:        General: Normal range of motion.     Cervical back: Normal range of motion and neck supple.  Skin:    General: Skin is warm.  Neurological:     General: No focal deficit present.     Mental Status: He is alert and oriented to person, place, and time.  Psychiatric:        Mood and Affect: Mood normal.        Behavior: Behavior normal.      Assessment: *Irritable bowel syndrome-diarrhea predominant *GERD-well-controlled on omeprazole *Hypertension  Plan: For patient's IBS with diarrhea, continue on Imodium as needed.  GERD well-controlled on omeprazole.  We will continue.  The patient was found to have elevated blood pressure when vital signs were checked in the office. The blood pressure was rechecked by the nursing staff, and it was found be persistently elevated >140/90 mmHg. I personally advised the patient to follow up closely with the PCP for hypertension control.   Follow-up in 6 months.  04/03/2022 1:37 PM   Disclaimer: This note was dictated with voice recognition software. Similar sounding words can inadvertently be  transcribed and may not be corrected upon review.

## 2022-04-03 NOTE — Patient Instructions (Signed)
   For your irritable bowel syndrome and diarrhea, I recommend taking Imodium as needed.    Continue on Omeprazole daily for chronic reflux.    Follow-up in 6 months  It was nice seeing you again today.   Dr. Abbey Chatters

## 2022-04-30 ENCOUNTER — Inpatient Hospital Stay: Payer: 59 | Admitting: Physician Assistant

## 2022-04-30 ENCOUNTER — Ambulatory Visit (HOSPITAL_COMMUNITY): Admission: RE | Admit: 2022-04-30 | Payer: 59 | Source: Ambulatory Visit

## 2022-05-01 ENCOUNTER — Ambulatory Visit: Payer: 59 | Admitting: Family Medicine

## 2022-05-22 ENCOUNTER — Other Ambulatory Visit (INDEPENDENT_AMBULATORY_CARE_PROVIDER_SITE_OTHER): Payer: Self-pay | Admitting: Gastroenterology

## 2022-05-22 ENCOUNTER — Telehealth: Payer: Self-pay

## 2022-05-22 DIAGNOSIS — R110 Nausea: Secondary | ICD-10-CM

## 2022-05-22 MED ORDER — ONDANSETRON HCL 4 MG PO TABS: 4.0000 mg | ORAL_TABLET | Freq: Three times a day (TID) | ORAL | 0 refills | Status: DC | PRN

## 2022-05-22 NOTE — Telephone Encounter (Signed)
Phoned and the pt's vm was full.

## 2022-05-22 NOTE — Telephone Encounter (Signed)
Pt requests a refill on his ondansetron sent to his pharmacy. His last ov was 04/03/2022. Please advise

## 2022-05-22 NOTE — Telephone Encounter (Signed)
Phoned and advised the pt of his Rx being sent in. He stated he had just got the text

## 2022-05-23 NOTE — Progress Notes (Signed)
[facility]  Visit Date: 05/24/2022  Visit Type: In-Person at [facility] SHARED DECISION-MAKING VISIT - Age: 57 y.o.  - Pack year smoking history: 35 pack-years  - Type of tobacco abuse: Cigarettes - Current smoker or < 15 years of cessation: Former smoker (quit May 2020) - No current symptoms of lung cancer:  Patient denies any hemoptysis, unintentional weight loss, and unexplained cough - Risks and benefits of lung cancer screening discussed: Negative: Over-diagnosis, radiation exposure, false positives, and additional testing Positive: Discover early stage lung cancer resulting in higher incidence of cure - Patient educated regarding the importance of adherence to continued lung cancer screening. - Currently, there are no co-morbidities to prevent treatment to therapy for lung cancer and the patient is agreeable to pursue treatment if a malignancy is discovered.  [place] Preventative Services Task Force recommend annual screening for lung cancer with low-dose CT in adults aged 43 - 56 years who have a 20+ pack year smoking history and currently smoke or have quit smoking within the past 15 years.  Screening should be discontinued once a person has not smoked for 15 years or develops a health problem that substantially limits life expectancy or the ability or willingness to have curative lung surgery.  It is a category B recommendation.  Similar stances are provided by CMS, NCCN, and AATS.  Social History   Tobacco Use   Smoking status: Former    Packs/day: 1.00    Years: 25.00    Total pack years: 25.00    Types: Cigarettes    Quit date: 07/16/2017    Years since quitting: 4.8    Passive exposure: Past   Smokeless tobacco: Never  Vaping Use   Vaping Use: Never used  Substance Use Topics   Alcohol use: Yes    Alcohol/week: 12.0 standard drinks of alcohol    Types: 12 Cans of beer per week    Comment: 12 cans of beer/week, mostly on weekends.   Drug  use: No     Personal history of tobacco use presenting hazards to health: - This patient meets criteria for low-dose CT lung cancer screening  - The shared decision making visit discussion included risks and benefits of screening, potential for follow-up, diagnostic testing for abnormal scans, potential for false positive tests, overdiagnosis, discussion about total radiation exposure - Patient stated willingness to undergo diagnostics and treatment as needed - Patient was counseled on smoking cessation to decrease the  risk of lung cancer, pulmonary disease, heart disease, and stroke - Patient has been referred to Lung Cancer Screening Nurse Coordinator for further scheduling of LDCT and for further resources regarding free nicotine replacement therapy and information about smoking cessation classes - Counseling on the importance of adherence to annual lung cancer LDCT screening, impact of co-morbidities, and ability or willingness to undergo diagnosis and treatment is imperative for compliance of the program. - Counseling on the importance of continued smoking cessation for former smokers; the importance of smoking cessation for current smokers, and information about tobacco cessation interventions have been given to patient including [place] and 1-800-quit [place] programs.  Yearly follow up will be coordinated by Adonis Huguenin, RN, MSN [facility] Oncology Nurse Navigator.)  Harriett Rush, PA-C  05/24/22 8:57 AM

## 2022-05-24 ENCOUNTER — Ambulatory Visit (HOSPITAL_COMMUNITY)
Admission: RE | Admit: 2022-05-24 | Discharge: 2022-05-24 | Disposition: A | Discharge status: Home / Self Care | Payer: 59 | Source: Ambulatory Visit | Attending: Physician Assistant | Admitting: Physician Assistant

## 2022-05-24 ENCOUNTER — Inpatient Hospital Stay: Payer: 59 | Attending: Physician Assistant | Admitting: Physician Assistant

## 2022-05-24 DIAGNOSIS — Z122 Encounter for screening for malignant neoplasm of respiratory organs: Secondary | ICD-10-CM | POA: Insufficient documentation

## 2022-05-24 DIAGNOSIS — Z87891 Personal history of nicotine dependence: Secondary | ICD-10-CM | POA: Insufficient documentation

## 2022-05-24 NOTE — Patient Instructions (Signed)
You were seen today for your shared decision making visit and a low-dose CT scan for lung cancer screening.    Thank you for your participation in this lifesaving program!  

## 2022-06-04 ENCOUNTER — Telehealth: Payer: Self-pay | Admitting: Family Medicine

## 2022-06-04 ENCOUNTER — Ambulatory Visit: Payer: 59 | Admitting: Family Medicine

## 2022-06-04 ENCOUNTER — Encounter: Payer: Self-pay | Admitting: Family Medicine

## 2022-06-04 VITALS — BP 138/87 | HR 72 | Temp 98.8°F | Ht 71.0 in | Wt 191.0 lb

## 2022-06-04 DIAGNOSIS — R5382 Chronic fatigue, unspecified: Secondary | ICD-10-CM | POA: Diagnosis not present

## 2022-06-04 DIAGNOSIS — G4719 Other hypersomnia: Secondary | ICD-10-CM

## 2022-06-04 DIAGNOSIS — R0683 Snoring: Secondary | ICD-10-CM | POA: Diagnosis not present

## 2022-06-04 DIAGNOSIS — M2609 Other specified anomalies of jaw size: Secondary | ICD-10-CM

## 2022-06-04 DIAGNOSIS — I10 Essential (primary) hypertension: Secondary | ICD-10-CM

## 2022-06-04 NOTE — Patient Instructions (Signed)
Sleep Apnea Sleep apnea is a condition in which breathing pauses or becomes shallow during sleep. People with sleep apnea usually snore loudly. They may have times when they gasp and stop breathing for 10 seconds or more during sleep. This may happen many times during the night. Sleep apnea disrupts your sleep and keeps your body from getting the rest that it needs. This condition can increase your risk of certain health problems, including: Heart attack. Stroke. Obesity. Type 2 diabetes. Heart failure. Irregular heartbeat. High blood pressure. The goal of treatment is to help you breathe normally again. What are the causes?  The most common cause of sleep apnea is a collapsed or blocked airway. There are three kinds of sleep apnea: Obstructive sleep apnea. This kind is caused by a blocked or collapsed airway. Central sleep apnea. This kind happens when the part of the brain that controls breathing does not send the correct signals to the muscles that control breathing. Mixed sleep apnea. This is a combination of obstructive and central sleep apnea. What increases the risk? You are more likely to develop this condition if you: Are overweight. Smoke. Have a smaller than normal airway. Are older. Are male. Drink alcohol. Take sedatives or tranquilizers. Have a family history of sleep apnea. Have a tongue or tonsils that are larger than normal. What are the signs or symptoms? Symptoms of this condition include: Trouble staying asleep. Loud snoring. Morning headaches. Waking up gasping. Dry mouth or sore throat in the morning. Daytime sleepiness and tiredness. If you have daytime fatigue because of sleep apnea, you may be more likely to have: Trouble concentrating. Forgetfulness. Irritability or mood swings. Personality changes. Feelings of depression. Sexual dysfunction. This may include loss of interest if you are male, or erectile dysfunction if you are male. How is this  diagnosed? This condition may be diagnosed with: A medical history. A physical exam. A series of tests that are done while you are sleeping (sleep study). These tests are usually done in a sleep lab, but they may also be done at home. How is this treated? Treatment for this condition aims to restore normal breathing and to ease symptoms during sleep. It may involve managing health issues that can affect breathing, such as high blood pressure or obesity. Treatment may include: Sleeping on your side. Using a decongestant if you have nasal congestion. Avoiding the use of depressants, including alcohol, sedatives, and narcotics. Losing weight if you are overweight. Making changes to your diet. Quitting smoking. Using a device to open your airway while you sleep, such as: An oral appliance. This is a custom-made mouthpiece that shifts your lower jaw forward. A continuous positive airway pressure (CPAP) device. This device blows air through a mask when you breathe out (exhale). A nasal expiratory positive airway pressure (EPAP) device. This device has valves that you put into each nostril. A bi-level positive airway pressure (BIPAP) device. This device blows air through a mask when you breathe in (inhale) and breathe out (exhale). Having surgery if other treatments do not work. During surgery, excess tissue is removed to create a wider airway. Follow these instructions at home: Lifestyle Make any lifestyle changes that your health care provider recommends. Eat a healthy, well-balanced diet. Take steps to lose weight if you are overweight. Avoid using depressants, including alcohol, sedatives, and narcotics. Do not use any products that contain nicotine or tobacco. These products include cigarettes, chewing tobacco, and vaping devices, such as e-cigarettes. If you need help quitting, ask   your health care provider. General instructions Take over-the-counter and prescription medicines only as told  by your health care provider. If you were given a device to open your airway while you sleep, use it only as told by your health care provider. If you are having surgery, make sure to tell your health care provider you have sleep apnea. You may need to bring your device with you. Keep all follow-up visits. This is important. Contact a health care provider if: The device that you received to open your airway during sleep is uncomfortable or does not seem to be working. Your symptoms do not improve. Your symptoms get worse. Get help right away if: You develop: Chest pain. Shortness of breath. Discomfort in your back, arms, or stomach. You have: Trouble speaking. Weakness on one side of your body. Drooping in your face. These symptoms may represent a serious problem that is an emergency. Do not wait to see if the symptoms will go away. Get medical help right away. Call your local emergency services (911 in the [place] Do not drive yourself to the hospital. Summary Sleep apnea is a condition in which breathing pauses or becomes shallow during sleep. The most common cause is a collapsed or blocked airway. The goal of treatment is to restore normal breathing and to ease symptoms during sleep. This information is not intended to replace advice given to you by your health care provider. Make sure you discuss any questions you have with your health care provider. Document Revised: 10/11/2020 Document Reviewed: 02/11/2020 Elsevier Patient Education  2023 Elsevier Inc.  

## 2022-06-04 NOTE — Progress Notes (Signed)
Subjective: CC: Hypertension PCP: [name], DO Seth Mcmillan H Bardin is a 57 y.o. male presenting to clinic today for:  1.  Hypertension Patient is compliant with amlodipine.  No chest pain, shortness of breath or edema.  2.  Excessive sleepiness Patient reports of fatigue.  He reports that he started some iron in efforts to alleviate this as he thought perhaps he was anemic.  This only helped slightly.  He denies any bleeding.  No unplanned weight loss.  He admits that his wife does observe him snoring and has seen him stop breathing a few times.  He predominately breathes out of his mouth.  Reports that fatigue is so bad that sometimes he will find himself dozing off at work if he is sitting for long enough.   ROS: Per HPI  No Known Allergies Past Medical History:  Diagnosis Date   Anxiety    Arthritis    Depression    Fatty liver    GERD (gastroesophageal reflux disease)    Hyperlipidemia    Hypertension    IBS (irritable bowel syndrome)    Diarrhea type    Current Outpatient Medications:    amLODipine (NORVASC) 5 MG tablet, Take 1 tablet (5 mg total) by mouth daily., Disp: 90 tablet, Rfl: 3   Ascorbic Acid (VITAMIN C) 1000 MG tablet, Take 1,000 mg by mouth daily., Disp: , Rfl:    aspirin EC 81 MG tablet, Take 81 mg by mouth daily., Disp: , Rfl:    celecoxib (CELEBREX) 100 MG capsule, Take 1 capsule (100 mg total) by mouth 2 (two) times daily as needed for moderate pain., Disp: 180 capsule, Rfl: 1   cholecalciferol (VITAMIN D3) 25 MCG (1000 UT) tablet, Take 1,000 Units by mouth daily., Disp: , Rfl:    escitalopram (LEXAPRO) 10 MG tablet, Take 1 tablet (10 mg total) by mouth daily., Disp: 90 tablet, Rfl: 3   fenofibrate (TRICOR) 145 MG tablet, Take 1 tablet (145 mg total) by mouth daily. To REPLACE mg, Disp: 90 tablet, Rfl: 3   lidocaine (LIDODERM) 5 %, USE 1 PATCH EXTERNALLY ONCE DAILY AS NEEDED (FOR RIB PAIN) *REMOVE AND DISCARD PATCH WITHIN 12 HOURS OR AS DIRECTED  BY MD*, Disp: 30 patch, Rfl: PRN   omeprazole (PRILOSEC) 40 MG capsule, Take 1 capsule (40 mg total) by mouth daily., Disp: 90 capsule, Rfl: 3   ondansetron (ZOFRAN) 4 MG tablet, Take 1 tablet (4 mg total) by mouth every 8 (eight) hours as needed., Disp: 30 tablet, Rfl: 0   rosuvastatin (CRESTOR) 40 MG tablet, Take 1 tablet (40 mg total) by mouth daily., Disp: 90 tablet, Rfl: 3   triamcinolone cream (KENALOG) 0.1 %, Apply 1 Application topically 2 (two) times daily., Disp: 30 g, Rfl: 0   VITAMIN E PO, Take by mouth daily., Disp: , Rfl:  Social History   Socioeconomic History   Marital status: Divorced    Spouse name: Not on file   Number of children: Not on file   Years of education: Not on file   Highest education level: Not on file  Occupational History   Not on file  Tobacco Use   Smoking status: Former    Packs/day: 1.00    Years: 25.00    Additional pack years: 0.00    Total pack years: 25.00    Types: Cigarettes    Quit date: 07/16/2017    Years since quitting: 4.8    Passive exposure: Past   Smokeless tobacco: Never  Vaping Use   Vaping Use: Never used  Substance and Sexual Activity   Alcohol use: Yes    Alcohol/week: 12.0 standard drinks of alcohol    Types: 12 Cans of beer per week    Comment: 12 cans of beer/week, mostly on weekends.   Drug use: No   Sexual activity: Not on file  Other Topics Concern   Not on file  Social History Narrative   Works at a cigarette factory   Social Determinants of Radio broadcast assistant Strain: Not on file  Food Insecurity: Not on file  Transportation Needs: Not on file  Physical Activity: Not on file  Stress: Not on file  Social Connections: Not on file  Intimate Partner Violence: Not on file   Family History  Problem Relation Age of Onset   Heart disease Mother    Dementia Mother    Heart disease Father    Diabetes Brother    Colon cancer Neg Hx     Objective: Office vital signs reviewed. BP 138/87   Pulse 72    Temp 98.8 F (37.1 C)   Ht 5\' 11"  (1.803 m)   Wt 191 lb (86.6 kg)   SpO2 96%   BMI 26.64 kg/m   Physical Examination:  General: Awake, alert, well nourished, No acute distress HEENT: sclera white, deviated septum.  Slight micrognathia appreciated.  No exophthalmos.  No goiter Cardio: regular rate and rhythm, S1S2 heard, no murmurs appreciated Pulm: clear to auscultation bilaterally, no wheezes, rhonchi or rales; normal work of breathing on room air    Assessment/ Plan: 57 y.o. 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  Continue current regimen  I suspect chronic fatigue may be related to undiagnosed OSA.  He certainly appears to have slight micrognathia on exam which would be contributory.  Will rule out any metabolic causes of fatigue.  Referral to sleep studies has been placed per his request.  Preferred in office sleep study  No orders of the defined types were placed in this encounter.  No orders of the defined types were placed in this encounter.    [name], DO [place] [phone]

## 2022-06-05 ENCOUNTER — Other Ambulatory Visit: Payer: Self-pay

## 2022-06-05 LAB — T4, FREE: Free T4: 1.11 ng/dL (ref 0.82–1.77)

## 2022-06-05 LAB — CBC
Hematocrit: 43.6 % (ref 37.5–51.0)
Hemoglobin: 14.5 g/dL (ref 13.0–17.7)
MCH: 31 pg (ref 26.6–33.0)
MCHC: 33.3 g/dL (ref 31.5–35.7)
MCV: 93 fL (ref 79–97)
Platelets: 258 10*3/uL (ref 150–450)
RBC: 4.68 x10E6/uL (ref 4.14–5.80)
RDW: 13.1 % (ref 11.6–15.4)
WBC: 5.8 10*3/uL (ref 3.4–10.8)

## 2022-06-05 LAB — TSH: TSH: 2.86 u[IU]/mL (ref 0.450–4.500)

## 2022-06-05 LAB — TESTOSTERONE: Testosterone: 186 ng/dL — ABNORMAL LOW (ref 264–916)

## 2022-06-05 LAB — VITAMIN B12: Vitamin B-12: 532 pg/mL (ref 232–1245)

## 2022-06-21 NOTE — Progress Notes (Signed)
Patient notified of LDCT Lung Cancer Screening Results via mail with the recommendation to follow-up in 12 months. Patient's referring provider has been sent a copy of results. Results are as follows:   IMPRESSION: 1. Lung-RADS 2S, benign appearance or behavior. Continue annual screening with low-dose chest CT without contrast in 12 months. 2. The "S" modifier above refers to potentially clinically significant non lung cancer related findings. Specifically, there is aortic atherosclerosis, in addition to right coronary artery disease. Please note that although the presence of coronary artery calcium documents the presence of coronary artery disease, the severity of this disease and any potential stenosis cannot be assessed on this non-gated CT examination. Assessment for potential risk factor modification, dietary therapy or pharmacologic therapy may be warranted, if clinically indicated. 3. Mild diffuse bronchial wall thickening with mild centrilobular and paraseptal emphysema; imaging findings suggestive of underlying COPD.   Aortic Atherosclerosis (ICD10-I70.0) and Emphysema (ICD10-J43.9). 

## 2022-07-10 ENCOUNTER — Encounter: Payer: Self-pay | Admitting: Neurology

## 2022-07-10 ENCOUNTER — Ambulatory Visit (INDEPENDENT_AMBULATORY_CARE_PROVIDER_SITE_OTHER): Payer: 59 | Admitting: Neurology

## 2022-07-10 VITALS — BP 152/100 | HR 73 | Ht 71.0 in | Wt 191.4 lb

## 2022-07-10 DIAGNOSIS — R030 Elevated blood-pressure reading, without diagnosis of hypertension: Secondary | ICD-10-CM

## 2022-07-10 DIAGNOSIS — G478 Other sleep disorders: Secondary | ICD-10-CM | POA: Diagnosis not present

## 2022-07-10 DIAGNOSIS — R0683 Snoring: Secondary | ICD-10-CM | POA: Diagnosis not present

## 2022-07-10 DIAGNOSIS — Z9189 Other specified personal risk factors, not elsewhere classified: Secondary | ICD-10-CM | POA: Diagnosis not present

## 2022-07-10 DIAGNOSIS — G4719 Other hypersomnia: Secondary | ICD-10-CM

## 2022-07-10 DIAGNOSIS — R351 Nocturia: Secondary | ICD-10-CM

## 2022-07-10 NOTE — Patient Instructions (Signed)

## 2022-07-10 NOTE — Progress Notes (Signed)
Subjective:    Patient ID: Seth Mcmillan is a 57 y.o. male.  HPI    Huston Foley, MD, PhD [facility] [address] [address] [place], [place] [address]  Dear Dr. Nadine Counts,  I saw your patient, Seth Mcmillan, upon your kind request in my sleep clinic today for initial consultation of his sleep disorder, in particular, concern for underlying obstructive sleep apnea.  The patient is unaccompanied today.  As you know, Seth Mcmillan is a 57 year old male with an underlying medical history of hypertension, hyperlipidemia, irritable bowel syndrome, reflux disease, arthritis, anxiety, depression, fatty liver, and mildly overweight state, who reports snoring and excessive daytime somnolence as well as witnessed apneas per wife's report (more so in the past, not recently).  His Epworth sleepiness score is 12 out of 24, fatigue severity score is 27 out of 63.  His blood pressure is elevated this morning, he reports that he has not taken his amlodipine yet, he usually takes it at work, around 9 AM. I reviewed your office note from 06/04/2022. He does not wake up rested, his sleep schedule varies.  He is in bed somewhere between 10 PM and 1 AM.  Rise time is around 6:30 AM.  His work is usually from 8-4.  He works as a Tax adviser at [employer].  He does work a lot of overtime hours he reports.  His weight has been more or less stable.  He lives with his wife, 61 year old granddaughter and 1 dog.  The dog sometimes sleeps on the bed with him but not all night long.  He does have a TV on at night in his bedroom and sometimes it stays on all night.  He quit smoking in May 2020.  He drinks alcohol daily, usually 3 beers per day, often close to bedtime.  He has 2 grown children from his previous marriage.  He has a total of 2 grandchildren.  He has no recurrent morning or nocturnal headaches but has nocturia about once per average night.  He has felt himself feel  sleepy at work even, especially when he is sedentary.  He had a tonsillectomy as a child.  His Past Medical History Is Significant For: Past Medical History:  Diagnosis Date   Anxiety    Arthritis    Depression    Fatty liver    GERD (gastroesophageal reflux disease)    Hyperlipidemia    Hypertension    IBS (irritable bowel syndrome)    Diarrhea type    His Past Surgical History Is Significant For: Past Surgical History:  Procedure Laterality Date   ANTERIOR CERVICAL DECOMP/DISCECTOMY FUSION N/A 05/18/2018   Procedure: C6-7. C7-T1 ANTERIOR CERVICAL DECOMPRESSION/DISCECTOMY FUSION, ALLOGRAFT, PLATE;  Surgeon: Eldred Manges, MD;  Location: [facility] OR;  Service: Orthopedics;  Laterality: N/A;   BIOPSY  09/05/2015   Procedure: BIOPSY;  Surgeon: West Bali, MD;  Location: [facility];  Service: Endoscopy;;  random colon bx   BIOPSY  05/25/2019   Procedure: BIOPSY;  Surgeon: West Bali, MD;  Location: [facility];  Service: Endoscopy;;   COLONOSCOPY N/A 09/05/2015   Dr. Darrick Penna: 4 simple adenomas removed.  Next colonoscopy 3 years.   COLONOSCOPY WITH PROPOFOL N/A 05/25/2019   Procedure: COLONOSCOPY WITH PROPOFOL;  Surgeon: West Bali, MD;  Location: [facility];  Service: Endoscopy;  Laterality: N/A;  12:15pm   ESOPHAGOGASTRODUODENOSCOPY (EGD) WITH PROPOFOL N/A 05/25/2019   Procedure: ESOPHAGOGASTRODUODENOSCOPY (EGD)  WITH PROPOFOL;  Surgeon: West Bali, MD;  Location: [facility];  Service: Endoscopy;  Laterality: N/A;   KNEE SURGERY Right 1997   bursa sac   POLYPECTOMY  09/05/2015   Procedure: POLYPECTOMY;  Surgeon: West Bali, MD;  Location: [facility];  Service: Endoscopy;;  colon polyps   SHOULDER ARTHROSCOPY WITH BICEPS TENDON REPAIR Right 2011   TONSILECTOMY, ADENOIDECTOMY, BILATERAL MYRINGOTOMY AND TUBES      His Family History Is Significant For: Family History  Problem Relation Age of Onset   Heart disease Mother    Dementia Mother    Heart disease  Father    Diabetes Brother    Sleep apnea Nephew    Colon cancer Neg Hx     His Social History Is Significant For: Social History   Socioeconomic History   Marital status: Divorced    Spouse name: Not on file   Number of children: Not on file   Years of education: Not on file   Highest education level: Not on file  Occupational History   Not on file  Tobacco Use   Smoking status: Former    Packs/day: 1.00    Years: 25.00    Additional pack years: 0.00    Total pack years: 25.00    Types: Cigarettes    Quit date: 07/16/2017    Years since quitting: 4.9    Passive exposure: Past   Smokeless tobacco: Never   Tobacco comments:    Pt smokes cigar daily   Vaping Use   Vaping Use: Never used  Substance and Sexual Activity   Alcohol use: Yes    Alcohol/week: 18.0 standard drinks of alcohol    Types: 18 Cans of beer per week   Drug use: No   Sexual activity: Not on file  Other Topics Concern   Not on file  Social History Narrative   Works at a cigarette factory   Social Determinants of Corporate investment banker Strain: Not on file  Food Insecurity: Not on file  Transportation Needs: Not on file  Physical Activity: Not on file  Stress: Not on file  Social Connections: Not on file    His Allergies Are:  No Known Allergies:   His Current Medications Are:  Outpatient Encounter Medications as of 07/10/2022  Medication Sig   amLODipine (NORVASC) 5 MG tablet Take 1 tablet (5 mg total) by mouth daily.   Ascorbic Acid (VITAMIN C) 1000 MG tablet Take 1,000 mg by mouth daily.   aspirin EC 81 MG tablet Take 81 mg by mouth daily.   B Complex Vitamins (B COMPLEX PO) Take by mouth.   celecoxib (CELEBREX) 100 MG capsule Take 1 capsule (100 mg total) by mouth 2 (two) times daily as needed for moderate pain. (Patient taking differently: Take 100 mg by mouth as needed for moderate pain.)   cholecalciferol (VITAMIN D3) 25 MCG (1000 UT) tablet Take 1,000 Units by mouth daily.    escitalopram (LEXAPRO) 10 MG tablet Take 1 tablet (10 mg total) by mouth daily.   fenofibrate (TRICOR) 145 MG tablet Take 1 tablet (145 mg total) by mouth daily. To REPLACE mg   omeprazole (PRILOSEC) 40 MG capsule Take 1 capsule (40 mg total) by mouth daily.   ondansetron (ZOFRAN) 4 MG tablet Take 1 tablet (4 mg total) by mouth every 8 (eight) hours as needed.   rosuvastatin (CRESTOR) 40 MG tablet Take 1 tablet (40 mg total) by mouth daily.   VITAMIN  E PO Take by mouth daily.   lidocaine (LIDODERM) 5 % USE 1 PATCH EXTERNALLY ONCE DAILY AS NEEDED (FOR RIB PAIN) *REMOVE AND DISCARD PATCH WITHIN 12 HOURS OR AS DIRECTED BY MD* (Patient not taking: Reported on 07/10/2022)   triamcinolone cream (KENALOG) 0.1 % Apply 1 Application topically 2 (two) times daily.   No facility-administered encounter medications on file as of 07/10/2022.  :   Review of Systems:  Out of a complete 14 point review of systems, all are reviewed and negative with the exception of these symptoms as listed below:  Review of Systems  Neurological:        Pt here for sleep consult Pt snores,hypertension,fatigue,headaches Pt denies sleep study,CPAP machine    ESS:12 FSS:27    Objective:  Neurological Exam  Physical Exam Physical Examination:   Vitals:   07/10/22 0835  BP: (!) 152/100  Pulse: 73    General Examination: The patient is a very pleasant 57 y.o. male in no acute distress. He appears well-developed and well-nourished and well groomed.   HEENT: Normocephalic, atraumatic, pupils are equal, round and reactive to light, extraocular tracking is good without limitation to gaze excursion or nystagmus noted. Hearing is grossly intact. Face is symmetric with normal facial animation. Speech is clear with no dysarthria noted. There is no hypophonia. There is no lip, neck/head, jaw or voice tremor. Neck is supple with full range of passive and active motion. There are no carotid bruits on auscultation. Oropharynx exam  reveals: mild mouth dryness, adequate dental hygiene and mild airway crowding, due to small airway entry but otherwise benign findings, Mallampati class I, tonsils absent.  Neck circumference 16-7/8 inches.  No overbite noted.  Tongue protrudes centrally and palate elevates symmetrically.  Chest: Clear to auscultation without wheezing, rhonchi or crackles noted.  Heart: S1+S2+0, regular and normal without murmurs, rubs or gallops noted.   Abdomen: Soft, non-tender and non-distended.  Extremities: There is no pitting edema in the distal lower extremities bilaterally.   Skin: Warm and dry without trophic changes noted.   Musculoskeletal: exam reveals no obvious joint deformities.   Neurologically:  Mental status: The patient is awake, alert and oriented in all 4 spheres. His immediate and remote memory, attention, language skills and fund of knowledge are appropriate. There is no evidence of aphasia, agnosia, apraxia or anomia. Speech is clear with normal prosody and enunciation. Thought process is linear. Mood is normal and affect is normal.  Cranial nerves II - XII are as described above under HEENT exam.  Motor exam: Normal bulk, strength and tone is noted. There is no obvious action or resting tremor.  Fine motor skills and coordination: grossly intact.  Cerebellar testing: No dysmetria or intention tremor. There is no truncal or gait ataxia.  Sensory exam: intact to light touch in the upper and lower extremities.  Gait, station and balance: He stands easily. No veering to one side is noted. No leaning to one side is noted. Posture is age-appropriate and stance is narrow based. Gait shows normal stride length and normal pace. No problems turning are noted.   Assessment and Plan:  In summary, Seth Mcmillan is a very pleasant 57 y.o.-year old male with an underlying medical history of hypertension, hyperlipidemia, irritable bowel syndrome, reflux disease, arthritis, anxiety, depression, fatty  liver, and mildly overweight state, whose history and physical exam are concerning for sleep disordered breathing, particularly obstructive sleep apnea.  A laboratory attended sleep study is typically considered "gold standard" for  evaluation of sleep disordered breathing.   I had a long chat with the patient about my findings and the diagnosis of sleep apnea, particularly OSA, its prognosis and treatment options. We talked about medical/conservative treatments, surgical interventions and non-pharmacological approaches for symptom control. I explained, in particular, the risks and ramifications of untreated moderate to severe OSA, especially with respect to developing cardiovascular disease down the road, including congestive heart failure (CHF), difficult to treat hypertension, cardiac arrhythmias (particularly A-fib), neurovascular complications including TIA, stroke and dementia. Even type 2 diabetes has, in part, been linked to untreated OSA. Symptoms of untreated OSA may include (but may not be limited to) daytime sleepiness, nocturia (i.e. frequent nighttime urination), memory problems, mood irritability and suboptimally controlled or worsening mood disorder such as depression and/or anxiety, lack of energy, lack of motivation, physical discomfort, as well as recurrent headaches, especially morning or nocturnal headaches. We talked about the importance of maintaining a healthy lifestyle and striving for healthy weight.  The importance of complete and ongoing full smoking cessation was also addressed.  In addition, we talked about the importance of striving for and maintaining good sleep hygiene.  In particular, he is discouraged from drinking alcohol daily with more than 2 servings, he is also discouraged from drinking alcohol close to bedtime as it is a known sleep disrupter.  In addition, he is advised not to eat in bed or watch TV in his bedroom.  I recommended a sleep study at this time. I outlined the  differences between a laboratory attended sleep study which is considered more comprehensive and accurate over the option of a home sleep test (HST); the latter may lead to underestimation of sleep disordered breathing in some instances and does not help with diagnosing upper airway resistance syndrome and is not accurate enough to diagnose primary central sleep apnea typically. I outlined possible surgical and non-surgical treatment options of OSA, including the use of a positive airway pressure (PAP) device (i.e. CPAP, AutoPAP/APAP or BiPAP in certain circumstances), a custom-made dental device (aka oral appliance, which would require a referral to a specialist dentist or orthodontist typically, and is generally speaking not considered for patients with full dentures or edentulous state), upper airway surgical options, such as traditional UPPP (which is not considered a first-line treatment) or the Inspire device (hypoglossal nerve stimulator, which would involve a referral for consultation with an ENT surgeon, after careful selection, following inclusion criteria - also not first-line treatment). I explained the PAP treatment option to the patient in detail, as this is generally considered first-line treatment.  The patient indicated that he would be willing to try PAP therapy, if the need arises. I explained the importance of being compliant with PAP treatment, not only for insurance purposes but primarily to improve patient's symptoms symptoms, and for the patient's long term health benefit, including to reduce His cardiovascular risks longer-term.    We will pick up our discussion about the next steps and treatment options after testing.  We will keep him posted as to the test results by phone call and/or MyChart messaging where possible.  We will plan to follow-up in sleep clinic accordingly as well.  I answered all his questions today and the patient was in agreement.   I encouraged him to call with any  interim questions, concerns, problems or updates or email [place] through MyChart.  Generally speaking, sleep test authorizations may take up to 2 weeks, sometimes less, sometimes longer, the patient is encouraged to get in  touch with [place] if they do not hear back from the sleep lab staff directly within the next 2 weeks.  Thank you very much for allowing me to participate in the care of this nice patient. If I can be of any further assistance to you please do not hesitate to call me at [phone].  Sincerely,   Huston Foley, MD, PhD

## 2022-07-17 ENCOUNTER — Telehealth: Payer: Self-pay | Admitting: Neurology

## 2022-07-17 NOTE — Telephone Encounter (Signed)
[employer] HST no auth req.  [employer] NPGS pending uploaded notes on the portal

## 2022-07-23 NOTE — Telephone Encounter (Signed)
[employer] denied the NPSG. HST-[employer] no auth req.   See below for the denial reason.

## 2022-07-24 ENCOUNTER — Telehealth: Payer: Self-pay | Admitting: Neurology

## 2022-07-24 NOTE — Telephone Encounter (Signed)
07/24/22 pt calling back [place] 07/17/22 [employer] no auth req EE

## 2022-08-16 IMAGING — DX DG RIBS W/ CHEST 3+V*L*
5 series · 5 of 5 positions shown · non-contrast
Comparison: 09/06/2019

CLINICAL DATA: Recent fall with left rib pain, initial encounter

EXAM:
LEFT RIBS AND CHEST - 3+ VIEW

[chest pa]
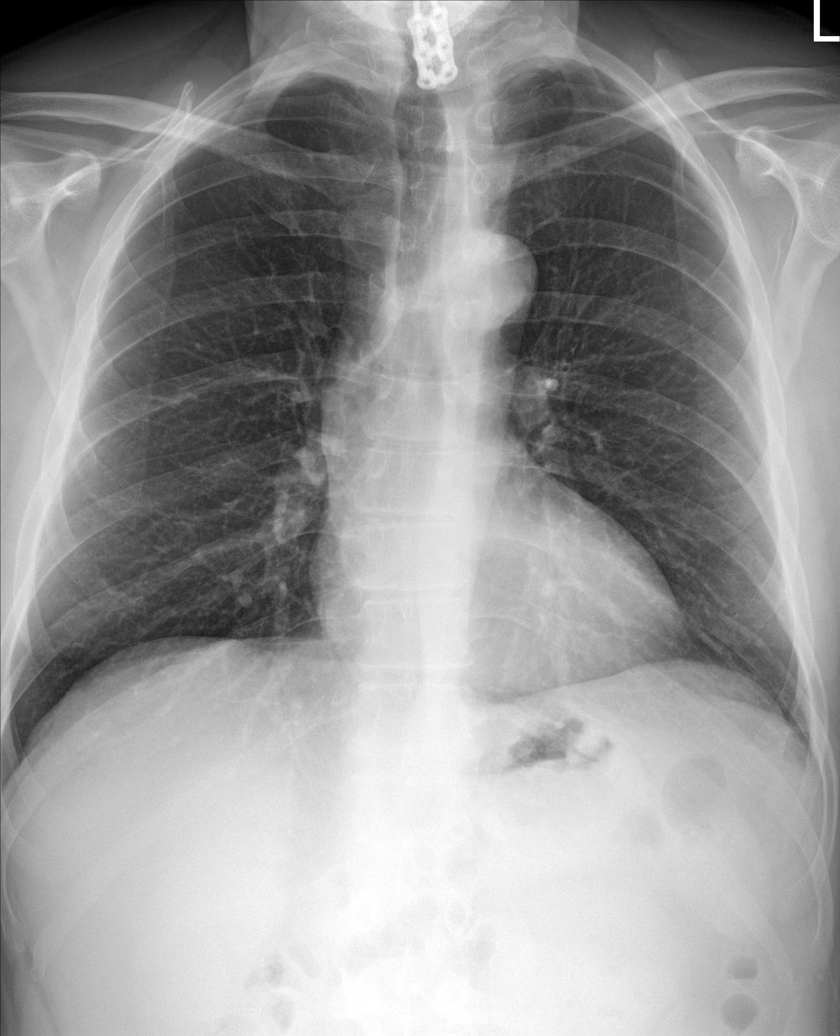

[rib obl (1 of 2)]
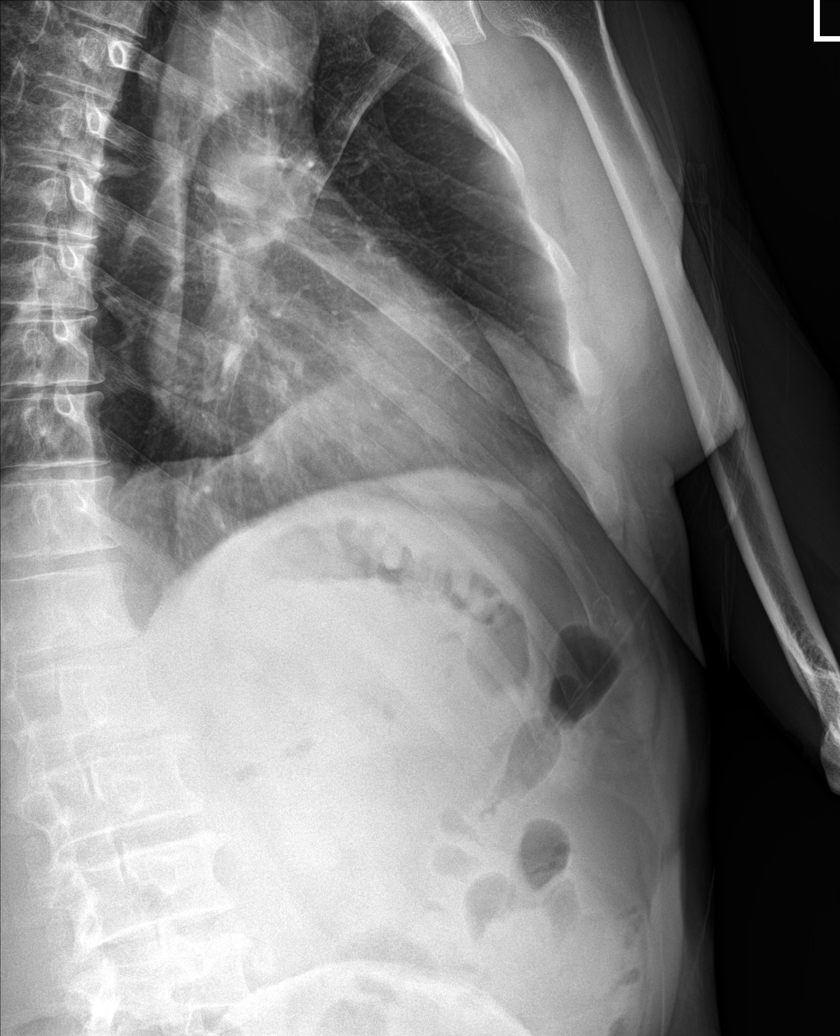

[rib obl (2 of 2)]
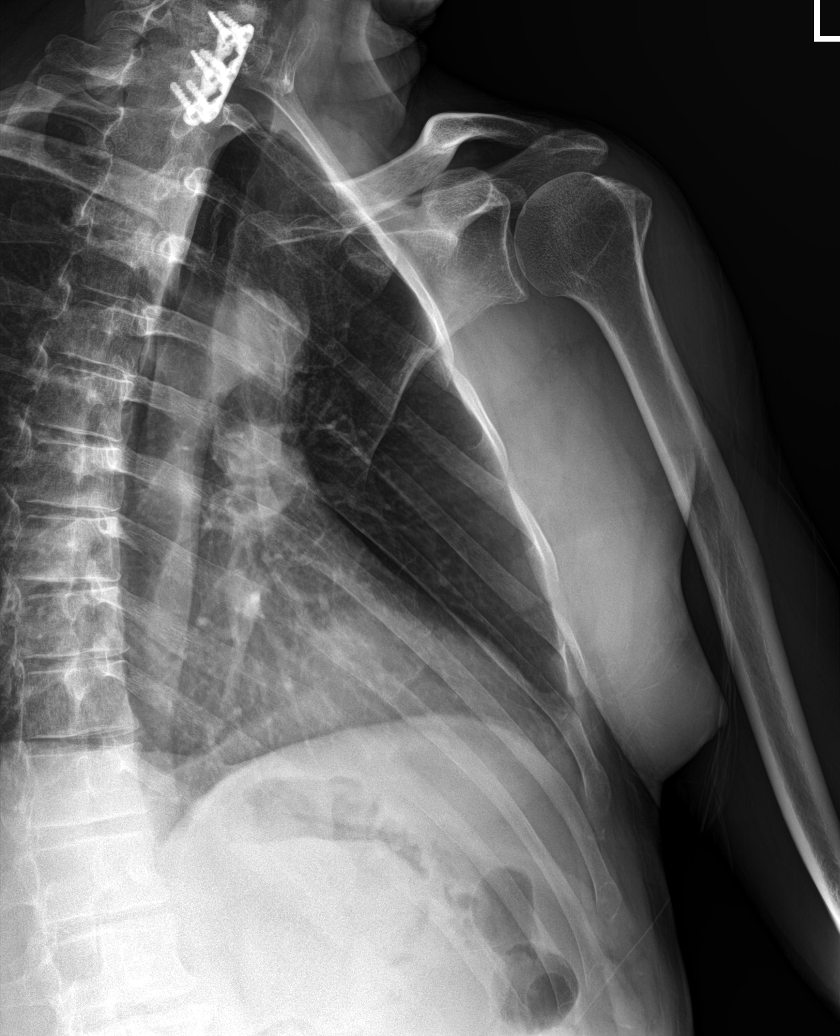

[rib ap (1 of 2)]
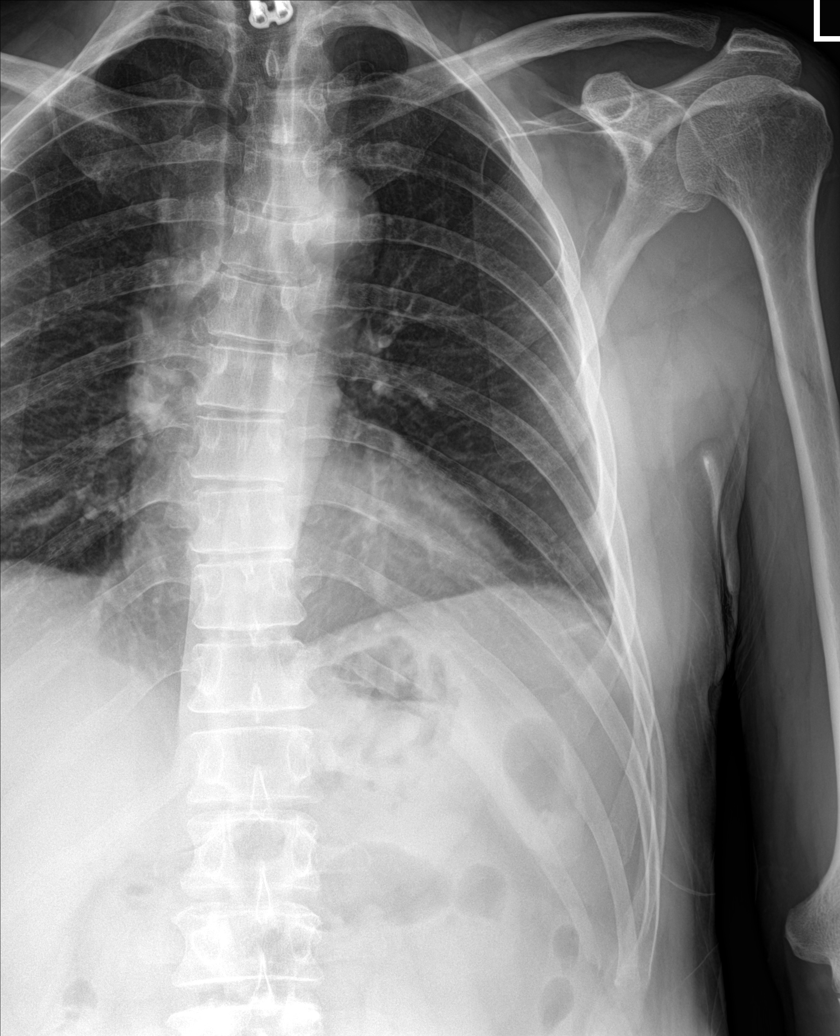

[rib ap (2 of 2)]
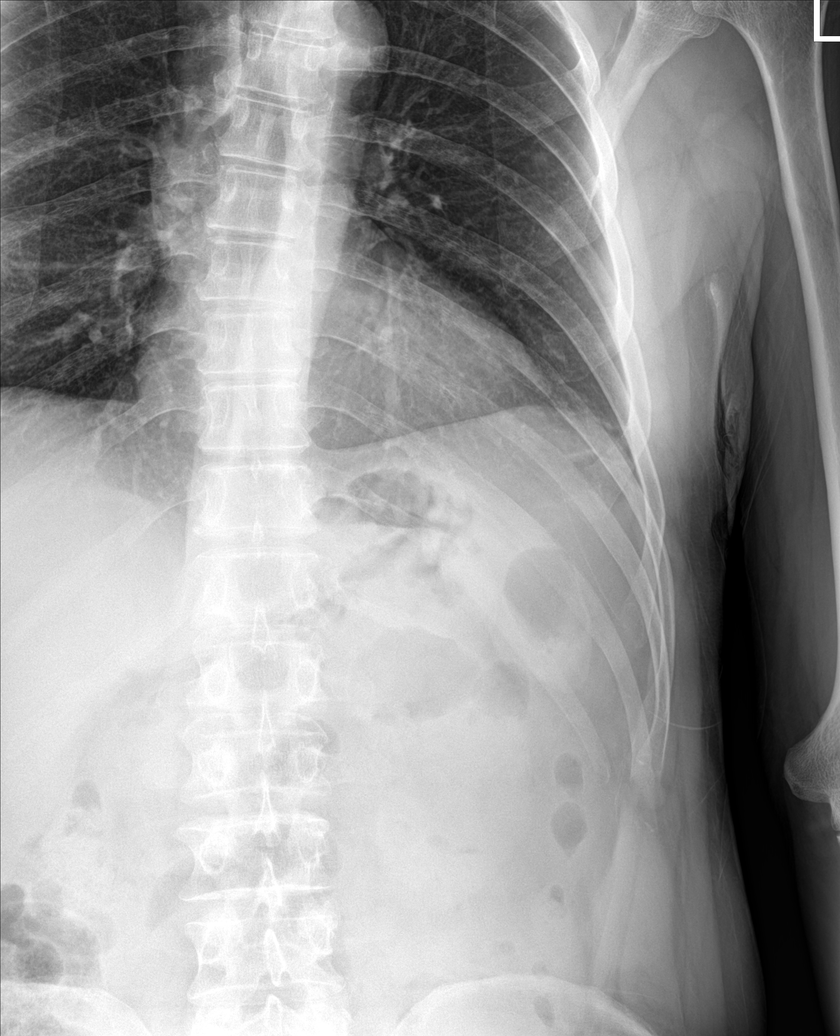

[5 of 5 positions shown; findings below may reference images not displayed]

FINDINGS: Cardiac shadow is within normal limits. Lungs are well aerated
bilaterally. No focal infiltrate, effusion or pneumothorax is seen.
No acute rib fracture is noted.
IMPRESSION: No acute abnormality seen.

## 2022-08-23 ENCOUNTER — Encounter: Payer: 59 | Admitting: Family Medicine

## 2022-08-26 ENCOUNTER — Encounter: Payer: Self-pay | Admitting: *Deleted

## 2022-08-28 ENCOUNTER — Other Ambulatory Visit: Payer: Self-pay | Admitting: Family Medicine

## 2022-08-28 DIAGNOSIS — E782 Mixed hyperlipidemia: Secondary | ICD-10-CM

## 2022-08-29 ENCOUNTER — Other Ambulatory Visit: Payer: Self-pay | Admitting: Family Medicine

## 2022-08-29 DIAGNOSIS — K219 Gastro-esophageal reflux disease without esophagitis: Secondary | ICD-10-CM

## 2022-08-30 ENCOUNTER — Telehealth: Payer: Self-pay | Admitting: Internal Medicine

## 2022-08-30 NOTE — Telephone Encounter (Signed)
noted 

## 2022-08-30 NOTE — Telephone Encounter (Signed)
I found the patient's FMLA forms outside the office door and gave them to Dr Queen Blossom nurse.

## 2022-09-03 NOTE — Telephone Encounter (Signed)
Pt's completed FMLA paperwork completed.

## 2022-09-05 ENCOUNTER — Telehealth: Payer: Self-pay | Admitting: Gastroenterology

## 2022-09-05 NOTE — Telephone Encounter (Signed)
FMLA papers are complete and in the sample drawer up front. I could not reach patient by phone. His voice mail is full. There is a $29 fee.

## 2022-09-12 NOTE — Telephone Encounter (Signed)
Patient has upcoming OV and his FMLA papers are in the file cabinet. He will need to pay $29 fee and is aware.

## 2022-09-25 ENCOUNTER — Other Ambulatory Visit: Payer: Self-pay | Admitting: Family Medicine

## 2022-09-25 DIAGNOSIS — F4322 Adjustment disorder with anxiety: Secondary | ICD-10-CM

## 2022-09-25 DIAGNOSIS — I10 Essential (primary) hypertension: Secondary | ICD-10-CM

## 2022-10-02 ENCOUNTER — Encounter: Payer: Self-pay | Admitting: Gastroenterology

## 2022-10-02 ENCOUNTER — Encounter: Payer: Self-pay | Admitting: *Deleted

## 2022-10-02 ENCOUNTER — Ambulatory Visit: Payer: 59 | Admitting: Gastroenterology

## 2022-10-02 VITALS — BP 173/93 | HR 51 | Temp 98.1°F | Ht 71.0 in | Wt 191.4 lb

## 2022-10-02 DIAGNOSIS — K219 Gastro-esophageal reflux disease without esophagitis: Secondary | ICD-10-CM | POA: Diagnosis not present

## 2022-10-02 DIAGNOSIS — K580 Irritable bowel syndrome with diarrhea: Secondary | ICD-10-CM

## 2022-10-02 DIAGNOSIS — K760 Fatty (change of) liver, not elsewhere classified: Secondary | ICD-10-CM | POA: Diagnosis not present

## 2022-10-02 NOTE — Patient Instructions (Addendum)
Continue omeprazole 40mg  daily as needed for acid reflux. Continue odansetron as needed for nausea.  You can use imodium 2mg  up to twice daily as needed to control loose stools. We will see you back in six months.   Instructions for fatty liver: Recommend 1-2# weight loss per week until ideal body weight through exercise & diet. Low fat/cholesterol diet.   Avoid sweets, sodas, fruit juices, sweetened beverages like tea, etc. Gradually increase exercise from 15 min daily up to 1 hr per day 5 days/week. Limit alcohol use.   It was a pleasure to see you today. I want to create trusting relationships with patients and provide genuine, compassionate, and quality care. I truly value your feedback, so please be on the lookout for a survey regarding your visit with me today. I appreciate your time in completing this!

## 2022-10-02 NOTE — Progress Notes (Signed)
GI Office Note    Referring Provider: [name], DO Primary Care Physician:  [name], DO  Primary Gastroenterologist: Hennie Duos. Marletta Lor, DO   Chief Complaint   Chief Complaint  Patient presents with   Follow-up    Follow up on IBS-D    History of Present Illness   Seth Mcmillan is a 57 y.o. male presenting today for follow up. Last seen 03/2022. History of IBS-D, GERD, fatty liver.   Colonoscopy 05/25/2019 unremarkable with a 5-year recall due to surveillance. EGD at the same time showed mild gastritis, biopsies negative for H. pylori. Did have a Schatzki's ring that was not manipulated. In 2017, random colon biopsies negative.celiac screen negative.  Has been treated with Xifaxan twice in the past, last time in 08/2020.  Doing ok. Increased stress over the past few weeks, remodeling at home, and at work. Recent vacation and BP systolic over 120, not stressed. Plan to retire next year, works [employer], 12 hours, 5-7 days per week.   BM 3-4 per day, loose. No melena, brbpr. Nocturnal symptoms, rare, only if eat a lot before bed. No heartburn as long as on medications. Ran out recently and had flare of symptoms. No dysphagia. No vomiting. Takes zofran as needed, seems to be related to stress.    Medications   Current Outpatient Medications  Medication Sig Dispense Refill   amLODipine (NORVASC) 5 MG tablet Take 1 tablet by mouth once daily 90 tablet 0   Ascorbic Acid (VITAMIN C) 1000 MG tablet Take 1,000 mg by mouth daily.     aspirin EC 81 MG tablet Take 81 mg by mouth daily.     B Complex Vitamins (B COMPLEX PO) Take by mouth.     celecoxib (CELEBREX) 100 MG capsule Take 1 capsule (100 mg total) by mouth 2 (two) times daily as needed for moderate pain. (Patient taking differently: Take 100 mg by mouth as needed for moderate pain.) 180 capsule 1   cholecalciferol (VITAMIN D3) 25 MCG (1000 UT) tablet Take 1,000 Units by mouth daily.     escitalopram (LEXAPRO) 10 MG  tablet Take 1 tablet by mouth once daily 90 tablet 0   fenofibrate (TRICOR) 145 MG tablet Take 1 tablet by mouth once daily 90 tablet 0   omeprazole (PRILOSEC) 40 MG capsule Take 1 capsule by mouth once daily 90 capsule 1   ondansetron (ZOFRAN) 4 MG tablet Take 1 tablet (4 mg total) by mouth every 8 (eight) hours as needed. 30 tablet 0   rosuvastatin (CRESTOR) 40 MG tablet Take 1 tablet by mouth once daily 90 tablet 0   VITAMIN E PO Take by mouth daily.     No current facility-administered medications for this visit.    Allergies   Allergies as of 10/02/2022   (No Known Allergies)      Review of Systems   General: Negative for anorexia, weight loss, fever, chills, fatigue, weakness. ENT: Negative for hoarseness, difficulty swallowing , nasal congestion. CV: Negative for chest pain, angina, palpitations, dyspnea on exertion, peripheral edema.  Respiratory: Negative for dyspnea at rest, dyspnea on exertion, cough, sputum, wheezing.  GI: See history of present illness. GU:  Negative for dysuria, hematuria, urinary incontinence, urinary frequency, nocturnal urination.  Endo: Negative for unusual weight change.     Physical Exam   BP (!) 173/93   Pulse (!) 51   Temp 98.1 F (36.7 C)   Ht 5\' 11"  (1.803 m)  Wt 191 lb 6.4 oz (86.8 kg)   BMI 26.69 kg/m    General: Well-nourished, well-developed in no acute distress.  Eyes: No icterus. Mouth: Oropharyngeal mucosa moist and pink   Lungs: Clear to auscultation bilaterally.  Heart: Regular rate and rhythm, no murmurs rubs or gallops.  Abdomen: Bowel sounds are normal, nontender, nondistended, no hepatosplenomegaly or masses,  no abdominal bruits or hernia , no rebound or guarding.  Rectal: not performed Extremities: No lower extremity edema. No clubbing or deformities. Neuro: Alert and oriented x 4   Skin: Warm and dry, no jaundice.   Psych: Alert and cooperative, normal mood and affect.  Labs   Lab Results  Component Value  Date   NA 141 01/21/2022   CL 104 01/21/2022   K 4.3 01/21/2022   CO2 20 01/21/2022   BUN 16 01/21/2022   CREATININE 1.06 01/21/2022   EGFR 81 01/21/2022   CALCIUM 9.1 01/21/2022   ALBUMIN 4.4 01/21/2022   GLUCOSE 92 01/21/2022   Lab Results  Component Value Date   WBC 5.8 06/04/2022   HGB 14.5 06/04/2022   HCT 43.6 06/04/2022   MCV 93 06/04/2022   PLT 258 06/04/2022   Lab Results  Component Value Date   ALT 27 01/21/2022   AST 35 01/21/2022   ALKPHOS 63 01/21/2022   BILITOT <0.2 01/21/2022   No results found for: "HGBA1C" Lab Results  Component Value Date   TSH 2.860 06/04/2022    Imaging Studies   No results found.  Assessment   *IBS-D *GERD *Fatty liver  Little flare of IBS-D lately with increased stress. Overall doing okay. BP also up with stress, working long hours. He is monitoring at home and plans to call PCP if remains elevated. Has upcoming appt in few months. Reflux doing well on PPI. Educated regarding fatty liver. Keep check on LFTs at least yearly.    The patient was found to have elevated blood pressure when vital signs were checked in the office. The blood pressure was rechecked by the nursing staff and it was found be persistently elevated >140/90 mmHg. I personally advised to the patient to follow up closely with his PCP for hypertension control.   PLAN   Continue omeprazole 40 mg daily as needed. Continue ondansetron as needed for nausea. Use Imodium 2 mg up to twice daily as needed to control loose stools. Follow-up LFTs planned for November at time of physical. Instructions for fatty liver and fatty liver diet provided. Return office visit in 6 months or call sooner if needed.  Leanna Battles. Melvyn Neth, MHS, PA-C [facility]

## 2022-10-07 ENCOUNTER — Encounter: Payer: Self-pay | Admitting: Family

## 2022-10-07 ENCOUNTER — Ambulatory Visit: Payer: 59 | Admitting: Family

## 2022-10-07 ENCOUNTER — Ambulatory Visit (INDEPENDENT_AMBULATORY_CARE_PROVIDER_SITE_OTHER): Payer: 59

## 2022-10-07 VITALS — BP 148/74 | HR 70 | Temp 97.2°F | Ht 71.0 in | Wt 190.2 lb

## 2022-10-07 DIAGNOSIS — B880 Other acariasis: Secondary | ICD-10-CM | POA: Diagnosis not present

## 2022-10-07 DIAGNOSIS — M25561 Pain in right knee: Secondary | ICD-10-CM

## 2022-10-07 MED ORDER — HYDROXYZINE PAMOATE 25 MG PO CAPS
25.0000 mg | ORAL_CAPSULE | Freq: Three times a day (TID) | ORAL | 0 refills | Status: DC | PRN
Start: 2022-10-07 — End: 2023-05-16

## 2022-10-07 MED ORDER — CELECOXIB 100 MG PO CAPS
100.0000 mg | ORAL_CAPSULE | ORAL | 1 refills | Status: DC | PRN
Start: 2022-10-07 — End: 2022-12-31

## 2022-10-07 MED ORDER — PREDNISONE 10 MG (21) PO TBPK
ORAL_TABLET | ORAL | 0 refills | Status: DC
Start: 2022-10-07 — End: 2022-10-29

## 2022-10-07 MED ORDER — TRIAMCINOLONE ACETONIDE 0.5 % EX OINT
1.0000 | TOPICAL_OINTMENT | Freq: Two times a day (BID) | CUTANEOUS | 0 refills | Status: DC
Start: 2022-10-07 — End: 2022-12-31

## 2022-10-07 NOTE — Patient Instructions (Signed)

## 2022-10-07 NOTE — Progress Notes (Signed)
Subjective:    Patient ID: Seth Mcmillan, male    DOB: [birth year], 57 y.o.   MRN: [id number]  Chief Complaint  Patient presents with   Knee Pain    RIGHT KNEE PAIN    CHIGGERS   Pt presents to the office today with right knee pain that started three months ago, but has worsen. Denies any injury. Had surgery on his bursa approx 2005.  Reports this weekend he was mowing in a field and his lawnmower died and had to walk through the field to work on his mower. After that noticed a rash on his legs that have spread to his groin, back, chest with intense itching.  Knee Pain  The incident occurred more than 1 week ago. There was no injury mechanism. The pain is present in the right knee. The quality of the pain is described as aching. The pain is at a severity of 6/10. The pain is moderate. The pain has been Constant since onset. Pertinent negatives include no loss of motion, numbness or tingling. He reports no foreign bodies present. The symptoms are aggravated by weight bearing. He has tried rest and acetaminophen for the symptoms. The treatment provided mild relief.  Rash This is a new problem. The current episode started in the past 7 days. The problem is unchanged. The affected locations include the right lower leg, back, left lower leg, groin, chest and abdomen. The rash is characterized by redness and itchiness. Past treatments include anti-itch cream. The treatment provided mild relief.      Review of Systems  Skin:  Positive for rash.  Neurological:  Negative for tingling and numbness.  All other systems reviewed and are negative.      Objective:   Physical Exam Vitals reviewed.  Constitutional:      General: He is not in acute distress.    Appearance: He is well-developed.  HENT:     Head: Normocephalic.  Eyes:     General:        Right eye: No discharge.        Left eye: No discharge.     Pupils: Pupils are equal, round, and reactive to light.  Neck:     Thyroid: No  thyromegaly.  Cardiovascular:     Rate and Rhythm: Normal rate and regular rhythm.     Heart sounds: Normal heart sounds. No murmur heard. Pulmonary:     Effort: Pulmonary effort is normal. No respiratory distress.     Breath sounds: Normal breath sounds. No wheezing.  Abdominal:     General: Bowel sounds are normal. There is no distension.     Palpations: Abdomen is soft.     Tenderness: There is no abdominal tenderness.  Musculoskeletal:        General: No tenderness. Normal range of motion.     Cervical back: Normal range of motion and neck supple.     Comments: Full ROM of right knee, mild effusion of right knee  Skin:    General: Skin is warm and dry.     Findings: Erythema and rash present. Rash is papular.     Comments: Scattered erythemas papular rash on bilateral legs, chest, and back  Neurological:     Mental Status: He is alert and oriented to person, place, and time.     Cranial Nerves: No cranial nerve deficit.     Deep Tendon Reflexes: Reflexes are normal and symmetric.  Psychiatric:        Behavior:  Behavior normal.        Thought Content: Thought content normal.        Judgment: Judgment normal.          BP (!) 148/74   Pulse 70   Temp (!) 97.2 F (36.2 C)   Ht 5\' 11"  (1.803 m)   Wt 190 lb 3.2 oz (86.3 kg)   SpO2 95%   BMI 26.53 kg/m   Assessment & Plan:  Seth Mcmillan comes in today with chief complaint of Knee Pain (RIGHT KNEE PAIN ) and CHIGGERS   Diagnosis and orders addressed:  1. Chigger bites Avoid scratching Keep clean and dry Report any s/s of infection Start prednisone, kenalog, and vistaril  Follow up if symptoms worsen or do not improve  - predniSONE (STERAPRED UNI-PAK 21 TAB) 10 MG (21) TBPK tablet; Use as directed  Dispense: 21 tablet; Refill: 0 - triamcinolone ointment (KENALOG) 0.5 %; Apply 1 Application topically 2 (two) times daily.  Dispense: 30 g; Refill: 0 - hydrOXYzine (VISTARIL) 25 MG capsule; Take 1 capsule (25 mg total)  by mouth every 8 (eight) hours as needed.  Dispense: 30 capsule; Refill: 0  2. Acute pain of right knee Rest Will hold off on injection today since needing oral prednisone for rash.  Start the Celebrex BID with food for next 7-10 days (has been on this, but only takes as needed) - predniSONE (STERAPRED UNI-PAK 21 TAB) 10 MG (21) TBPK tablet; Use as directed  Dispense: 21 tablet; Refill: 0 - celecoxib (CELEBREX) 100 MG capsule; Take 1 capsule (100 mg total) by mouth as needed for moderate pain.  Dispense: 180 capsule; Refill: 1 - DG Knee 1-2 Views Right   Jannifer Rodney, FNP

## 2022-10-29 ENCOUNTER — Ambulatory Visit: Payer: 59 | Admitting: Nurse Practitioner

## 2022-10-29 ENCOUNTER — Encounter: Payer: Self-pay | Admitting: Nurse Practitioner

## 2022-10-29 VITALS — BP 125/82 | HR 73 | Temp 98.6°F | Ht 71.0 in | Wt 194.6 lb

## 2022-10-29 DIAGNOSIS — M25561 Pain in right knee: Secondary | ICD-10-CM | POA: Diagnosis not present

## 2022-10-29 DIAGNOSIS — G8929 Other chronic pain: Secondary | ICD-10-CM | POA: Diagnosis not present

## 2022-10-29 MED ORDER — METHYLPREDNISOLONE ACETATE 40 MG/ML IJ SUSP
40.0000 mg | Freq: Once | INTRAMUSCULAR | Status: AC
Start: 2022-10-29 — End: 2022-10-29
  Administered 2022-10-29: 40 mg via INTRAMUSCULAR

## 2022-10-29 NOTE — Progress Notes (Signed)
Acute Office Visit  Subjective:     Patient ID: Seth Mcmillan, male    DOB: December 14, 1965, 57 y.o.   MRN: [id number]  Chief Complaint  Patient presents with   Knee Pain    Right knee pain for 3-4 months has gotten worse. Had xray last time he was in office had arthritis and fluid.     HPI Knee Pain: Patient presents with knee swelling involving the  right knee. Onset of the symptoms was several months ago. Ihad surgery 20 yrs ago inciting event: this is a longstanding problem which has been getting worse, with long standing . Current symptoms include stiffness and swelling. Pain is aggravated by going up and down stairs, kneeling, squatting, and standing.  Patient has had prior knee problems. Evaluation to date: none. Treatment to date: brace which is somewhat effective and OTC analgesics which are somewhat effective.   Review of Systems  Constitutional:  Negative for chills and fever.  Gastrointestinal:  Negative for blood in stool, melena, nausea and vomiting.  Musculoskeletal:  Positive for neck pain.       Right 8/10 pain  Skin:  Negative for itching and rash.  Neurological:  Negative for dizziness and headaches.  Psychiatric/Behavioral:  The patient does not have insomnia.    Negative unless indicated in HPI    Objective:    BP 125/82   Pulse 73   Temp 98.6 F (37 C) (Temporal)   Ht 5\' 11"  (1.803 m)   Wt 194 lb 9.6 oz (88.3 kg)   SpO2 96%   BMI 27.14 kg/m  BP Readings from Last 3 Encounters:  10/29/22 125/82  10/07/22 (!) 148/74  10/02/22 (!) 173/93   Wt Readings from Last 3 Encounters:  10/29/22 194 lb 9.6 oz (88.3 kg)  10/07/22 190 lb 3.2 oz (86.3 kg)  10/02/22 191 lb 6.4 oz (86.8 kg)      Physical Exam Vitals and nursing note reviewed.  Constitutional:      General: He is not in acute distress.    Appearance: Normal appearance.  HENT:     Head: Normocephalic and atraumatic.  Eyes:     Extraocular Movements: Extraocular movements intact.      Conjunctiva/sclera: Conjunctivae normal.     Pupils: Pupils are equal, round, and reactive to light.  Cardiovascular:     Rate and Rhythm: Normal rate and regular rhythm.  Pulmonary:     Effort: Pulmonary effort is normal.     Breath sounds: Normal breath sounds.  Musculoskeletal:     Right knee: Normal.     Left knee: Swelling present. Tenderness present.  Skin:    General: Skin is warm and dry.     Findings: No rash.  Neurological:     Mental Status: He is alert and oriented to person, place, and time. Mental status is at baseline.  Psychiatric:        Mood and Affect: Mood normal.        Behavior: Behavior normal.        Thought Content: Thought content normal.        Judgment: Judgment normal.     No results found for any visits on 10/29/22.      Assessment & Plan:  Chronic pain of right knee -     methylPREDNISolone Acetate  Other orders -     Arthrocentesis   Joint Injection/Arthrocentesis  Date/Time: 10/29/2022 10:17 AM  Performed by: Martina Sinner, NP Authorized by: Purnell Shoemaker  Richardson Landry, NP  Indications: pain  Body area: knee Joint: right knee Local anesthesia used: yes  Anesthesia: Local anesthesia used: yes Local Anesthetic: lidocaine 1% with epinephrine  Sedation: Patient sedated: no  Needle size: 22 G Ultrasound guidance: no Approach: anterior Aspirate: clear Methylprednisolone amount: 40 mg Lidocaine 1% amount: 2 mL Patient tolerance: patient tolerated the procedure well with no immediate complications Comments: Left knee injection, well tolerated by the patient    Continue wearing knee brace while working Avoid bending, squatting  Return if symptoms worsen or fail to improve. Arrie Aran Santa Lighter, DNP [facility] [address] [place], [place] [address] [phone]

## 2022-10-30 ENCOUNTER — Telehealth: Payer: Self-pay | Admitting: Family Medicine

## 2022-10-30 NOTE — Telephone Encounter (Signed)
Patient was seen on 8/13 and stated that he received a shot for his knee and a medication was supposed to be called in but the pharmacy did not have anything. He was unsure of the name of the medication. Please call back and advise.

## 2022-10-30 NOTE — Telephone Encounter (Signed)
Seth Mcmillan,  Were you going to send in an oral medication? I do not see it documented in the note.

## 2022-11-15 NOTE — Telephone Encounter (Signed)
No answer and vmail is full on home/cell number. Following up to make pt aware of Sandra's response and to make sure he is improving.  Pt has CPE scheduled with Dr. Nadine Counts in October.

## 2022-12-31 ENCOUNTER — Encounter: Payer: Self-pay | Admitting: Family Medicine

## 2022-12-31 ENCOUNTER — Ambulatory Visit: Payer: 59 | Admitting: Family Medicine

## 2022-12-31 VITALS — BP 143/88 | HR 60 | Temp 98.7°F | Ht 71.0 in | Wt 193.0 lb

## 2022-12-31 DIAGNOSIS — B079 Viral wart, unspecified: Secondary | ICD-10-CM

## 2022-12-31 DIAGNOSIS — Z87891 Personal history of nicotine dependence: Secondary | ICD-10-CM

## 2022-12-31 DIAGNOSIS — I10 Essential (primary) hypertension: Secondary | ICD-10-CM

## 2022-12-31 DIAGNOSIS — Z0001 Encounter for general adult medical examination with abnormal findings: Secondary | ICD-10-CM | POA: Diagnosis not present

## 2022-12-31 DIAGNOSIS — Z114 Encounter for screening for human immunodeficiency virus [HIV]: Secondary | ICD-10-CM

## 2022-12-31 DIAGNOSIS — M150 Primary generalized (osteo)arthritis: Secondary | ICD-10-CM | POA: Diagnosis not present

## 2022-12-31 DIAGNOSIS — K219 Gastro-esophageal reflux disease without esophagitis: Secondary | ICD-10-CM

## 2022-12-31 DIAGNOSIS — Z125 Encounter for screening for malignant neoplasm of prostate: Secondary | ICD-10-CM

## 2022-12-31 DIAGNOSIS — F4322 Adjustment disorder with anxiety: Secondary | ICD-10-CM

## 2022-12-31 DIAGNOSIS — R7989 Other specified abnormal findings of blood chemistry: Secondary | ICD-10-CM

## 2022-12-31 DIAGNOSIS — Z0000 Encounter for general adult medical examination without abnormal findings: Secondary | ICD-10-CM

## 2022-12-31 DIAGNOSIS — E782 Mixed hyperlipidemia: Secondary | ICD-10-CM

## 2022-12-31 MED ORDER — OMEPRAZOLE 40 MG PO CPDR: 40.0000 mg | DELAYED_RELEASE_CAPSULE | Freq: Every day | ORAL | 3 refills | Status: DC

## 2022-12-31 MED ORDER — AMLODIPINE BESYLATE 5 MG PO TABS
5.0000 mg | ORAL_TABLET | Freq: Every day | ORAL | 3 refills | Status: DC
Start: 2022-12-31 — End: 2022-12-31

## 2022-12-31 MED ORDER — ESCITALOPRAM OXALATE 10 MG PO TABS: 10.0000 mg | ORAL_TABLET | Freq: Every day | ORAL | 3 refills | Status: DC

## 2022-12-31 MED ORDER — FENOFIBRATE 145 MG PO TABS
145.0000 mg | ORAL_TABLET | Freq: Every day | ORAL | 3 refills | Status: AC
Start: 2022-12-31 — End: ?

## 2022-12-31 MED ORDER — ROSUVASTATIN CALCIUM 40 MG PO TABS
40.0000 mg | ORAL_TABLET | Freq: Every day | ORAL | 3 refills | Status: AC
Start: 2022-12-31 — End: ?

## 2022-12-31 MED ORDER — CELECOXIB 200 MG PO CAPS: 200.0000 mg | ORAL_CAPSULE | Freq: Every day | ORAL | 3 refills | Status: DC | PRN

## 2022-12-31 MED ORDER — AMLODIPINE BESYLATE 10 MG PO TABS
10.0000 mg | ORAL_TABLET | Freq: Every day | ORAL | 3 refills | Status: DC
Start: 2022-12-31 — End: 2024-01-12

## 2022-12-31 NOTE — Patient Instructions (Signed)
Cryoablation, Care After The following information offers guidance on how to care for yourself after your procedure. Your health care provider may also give you more specific instructions. If you have problems or questions, contact your health care provider. What can I expect after the procedure? After the procedure, it is common to have: Redness or blisters near the area treated. Mild pain and swelling. Follow these instructions at home: Treatment area care  If you have an incision, follow instructions from your health care provider about how to take care of it. Make sure you: Wash your hands with soap and water for at least 20 seconds before and after you change your bandage (dressing). If soap and water are not available, use hand sanitizer. Change your dressing as told by your health care provider. Leave stitches (sutures), skin glue, or adhesive strips in place. These skin closures may need to stay in place for 2 weeks or longer. If adhesive strip edges start to loosen and curl up, you may trim the loose edges. Do not remove adhesive strips completely unless your health care provider tells you to do that. Check your treatment area every day for signs of infection. Check for: More redness, swelling, or pain. Fluid or blood. Warmth. Pus or a bad smell. Keep the treated area clean and dry. Keep it covered with a dressing until it has healed. Clean the area with soap and water as told by your health care provider. If your dressing gets wet, change it right away. Activity  Follow instructions from your health care provider about what activities are safe for you. You may have to avoid lifting. Ask your health care provider how much you can safely lift. If you were given a sedative during the procedure, it can affect you for several hours. Do not drive or operate machinery until your health care provider says that it is safe. General instructions Take over-the-counter and prescription  medicines only as told by your health care provider. Do not use any products that contain nicotine or tobacco. These products include cigarettes, chewing tobacco, and vaping devices, such as e-cigarettes. These can delay incision healing. If you need help quitting, ask your health care provider. Do not take baths, swim, or use a hot tub until your health care provider approves. Ask your health care provider if you may take showers. You may only be allowed to take sponge baths. Keep all follow-up visits. Your health care provider may need to check that treatment worked and that there were no problems caused by the procedure. Contact a health care provider if: You have more pain. You have a fever. You have nausea or vomiting. You have any signs of infection. You do not have a bowel movement for 2 days. You cannot urinate, or you cannot control when you urinate or have a bowel movement (have incontinence). You develop impotence. Get help right away if: You have severe pain. You have trouble swallowing or breathing. You are very weak or dizzy. You have chest pain or shortness of breath. These symptoms may be an emergency. Get help right away. Call 911. Do not wait to see if the symptoms will go away. Do not drive yourself to the hospital. This information is not intended to replace advice given to you by your health care provider. Make sure you discuss any questions you have with your health care provider. Document Revised: 08/17/2021 Document Reviewed: 08/17/2021 Elsevier Patient Education  2024 [employer].

## 2022-12-31 NOTE — Progress Notes (Signed)
Seth Mcmillan is a 57 y.o. male presents to office today for annual physical exam examination.    Concerns today include: 1. Osteoarthritis Patient continues to suffer with joint pain.  He primarily suffers in bilateral hands but notes that knees and joints can be generally affected.  Utilizing a knee brace currently.  Is seeing a hand specialist who does joint injections regularly.  Anticipates he will likely need some type of surgical intervention but is waiting till he retires next year.  He is left-hand dominant.  Utilizes Celebrex as needed but notes it is not especially helpful so typically he relies on ibuprofen and Tylenol.  Marital status: married, Substance use: former smoker Health Maintenance Due  Topic Date Due   HIV Screening  Never done   Refills needed today: all  Immunization History  Administered Date(s) Administered   Moderna Sars-Covid-2 Vaccination 07/20/2019, 09/15/2019   Td 08/21/2021   Tdap 07/07/2009   Past Medical History:  Diagnosis Date   Anxiety    Arthritis    Depression    Fatty liver    GERD (gastroesophageal reflux disease)    Hyperlipidemia    Hypertension    IBS (irritable bowel syndrome)    Diarrhea type   Social History   Socioeconomic History   Marital status: Divorced    Spouse name: Not on file   Number of children: Not on file   Years of education: Not on file   Highest education level: Not on file  Occupational History   Not on file  Tobacco Use   Smoking status: Former    Current packs/day: 0.00    Average packs/day: 1 pack/day for 25.0 years (25.0 ttl pk-yrs)    Types: Cigarettes    Start date: 07/16/1992    Quit date: 07/16/2017    Years since quitting: 5.4    Passive exposure: Past   Smokeless tobacco: Never   Tobacco comments:    Pt smokes cigar daily   Vaping Use   Vaping status: Never Used  Substance and Sexual Activity   Alcohol use: Yes    Alcohol/week: 18.0 standard drinks of alcohol    Types: 18 Cans of beer  per week   Drug use: No   Sexual activity: Not on file  Other Topics Concern   Not on file  Social History Narrative   Works at a cigarette factory   Social Determinants of Corporate investment banker Strain: Not on file  Food Insecurity: Not on file  Transportation Needs: Not on file  Physical Activity: Not on file  Stress: Not on file  Social Connections: Not on file  Intimate Partner Violence: Not on file   Past Surgical History:  Procedure Laterality Date   ANTERIOR CERVICAL DECOMP/DISCECTOMY FUSION N/A 05/18/2018   Procedure: C6-7. C7-T1 ANTERIOR CERVICAL DECOMPRESSION/DISCECTOMY FUSION, ALLOGRAFT, PLATE;  Surgeon: Eldred Manges, MD;  Location: [facility] OR;  Service: Orthopedics;  Laterality: N/A;   BIOPSY  09/05/2015   Procedure: BIOPSY;  Surgeon: West Bali, MD;  Location: [facility];  Service: Endoscopy;;  random colon bx   BIOPSY  05/25/2019   Procedure: BIOPSY;  Surgeon: West Bali, MD;  Location: [facility];  Service: Endoscopy;;   COLONOSCOPY N/A 09/05/2015   Dr. Darrick Penna: 4 simple adenomas removed.  Next colonoscopy 3 years.   COLONOSCOPY WITH PROPOFOL N/A 05/25/2019   Procedure: COLONOSCOPY WITH PROPOFOL;  Surgeon: West Bali, MD;  Location: [facility];  Service: Endoscopy;  Laterality: N/A;  12:15pm   ESOPHAGOGASTRODUODENOSCOPY (EGD) WITH PROPOFOL N/A 05/25/2019   Procedure: ESOPHAGOGASTRODUODENOSCOPY (EGD) WITH PROPOFOL;  Surgeon: West Bali, MD;  Location: [facility];  Service: Endoscopy;  Laterality: N/A;   KNEE SURGERY Right 1997   bursa sac   POLYPECTOMY  09/05/2015   Procedure: POLYPECTOMY;  Surgeon: West Bali, MD;  Location: [facility];  Service: Endoscopy;;  colon polyps   SHOULDER ARTHROSCOPY WITH BICEPS TENDON REPAIR Right 2011   TONSILECTOMY, ADENOIDECTOMY, BILATERAL MYRINGOTOMY AND TUBES     Family History  Problem Relation Age of Onset   Heart disease Mother    Dementia Mother    Heart disease Father    Diabetes Brother     Sleep apnea Nephew    Colon cancer Neg Hx     Current Outpatient Medications:    Ascorbic Acid (VITAMIN C) 1000 MG tablet, Take 1,000 mg by mouth daily., Disp: , Rfl:    aspirin EC 81 MG tablet, Take 81 mg by mouth daily., Disp: , Rfl:    B Complex Vitamins (B COMPLEX PO), Take by mouth., Disp: , Rfl:    celecoxib (CELEBREX) 100 MG capsule, Take 1 capsule (100 mg total) by mouth as needed for moderate pain., Disp: 180 capsule, Rfl: 1   cholecalciferol (VITAMIN D3) 25 MCG (1000 UT) tablet, Take 1,000 Units by mouth daily., Disp: , Rfl:    hydrOXYzine (VISTARIL) 25 MG capsule, Take 1 capsule (25 mg total) by mouth every 8 (eight) hours as needed., Disp: 30 capsule, Rfl: 0   ondansetron (ZOFRAN) 4 MG tablet, Take 1 tablet (4 mg total) by mouth every 8 (eight) hours as needed., Disp: 30 tablet, Rfl: 0   VITAMIN E PO, Take by mouth daily., Disp: , Rfl:    amLODipine (NORVASC) 5 MG tablet, Take 1 tablet (5 mg total) by mouth daily., Disp: 90 tablet, Rfl: 3   escitalopram (LEXAPRO) 10 MG tablet, Take 1 tablet (10 mg total) by mouth daily., Disp: 90 tablet, Rfl: 3   fenofibrate (TRICOR) 145 MG tablet, Take 1 tablet (145 mg total) by mouth daily., Disp: 90 tablet, Rfl: 3   omeprazole (PRILOSEC) 40 MG capsule, Take 1 capsule (40 mg total) by mouth daily., Disp: 90 capsule, Rfl: 3   rosuvastatin (CRESTOR) 40 MG tablet, Take 1 tablet (40 mg total) by mouth daily., Disp: 90 tablet, Rfl: 3  No Known Allergies   ROS: Review of Systems A comprehensive review of systems was negative except for: Integument/breast: positive for skin lesion(s)    Physical exam BP (!) 143/88   Pulse 60   Temp 98.7 F (37.1 C)   Ht 5\' 11"  (1.803 m)   Wt 193 lb (87.5 kg)   SpO2 95%   BMI 26.92 kg/m  General appearance: alert, cooperative, appears stated age, and no distress Head: Normocephalic, without obvious abnormality, atraumatic Eyes: negative findings: lids and lashes normal, conjunctivae and sclerae normal,  corneas clear, and pupils equal, round, reactive to light and accomodation Ears: normal TM's and external ear canals both ears Nose: Nares normal. Septum midline. Mucosa normal. No drainage or sinus tenderness. Throat: lips, mucosa, and tongue normal; teeth and gums normal Neck: no adenopathy, no carotid bruit, supple, symmetrical, trachea midline, and thyroid not enlarged, symmetric, no tenderness/mass/nodules Back: symmetric, no curvature. ROM normal. No CVA tenderness. Lungs: clear to auscultation bilaterally Chest wall: no tenderness Heart: regular rate and rhythm, S1, S2 normal, no murmur, click, rub or gallop Abdomen: soft, non-tender;  bowel sounds normal; no masses,  no organomegaly Extremities:  Osteoarthritic changes to her bilateral hands.  Right knee with brace.  Ambulating independently Pulses: 2+ and symmetric Skin:  Wart appreciated along the dorsal aspect of the right thumb at the base Lymph nodes: Cervical, supraclavicular, and axillary nodes normal. Neurologic: Grossly normal      12/31/2022    1:44 PM 10/29/2022    9:15 AM 06/04/2022    8:45 AM  Depression screen PHQ 2/9  Decreased Interest 0 0 0  Down, Depressed, Hopeless 0 0 0  PHQ - 2 Score 0 0 0  Altered sleeping 0 0 0  Tired, decreased energy 0 1 0  Change in appetite 0 0 0  Feeling bad or failure about yourself  0 0 0  Trouble concentrating 0 0 0  Moving slowly or fidgety/restless 0 0 0  Suicidal thoughts 0 0 0  PHQ-9 Score 0 1 0  Difficult doing work/chores Not difficult at all Not difficult at all Not difficult at all      12/31/2022    1:44 PM 10/29/2022    9:15 AM 06/04/2022    8:45 AM 01/21/2022    9:41 AM  GAD 7 : Generalized Anxiety Score  Nervous, Anxious, on Edge 0 0 0 0  Control/stop worrying 0 0 0 0  Worry too much - different things 0 0 0 0  Trouble relaxing 0 0 0 0  Restless 0 0 0 0  Easily annoyed or irritable 0 0 0 0  Afraid - awful might happen 0 0 0 0  Total GAD 7 Score 0 0 0 0   Anxiety Difficulty Not difficult at all Not difficult at all Not difficult at all Not difficult at all   Cryotherapy Procedure:  Risks and benefits of procedure were reviewed with the patient.  Written consent obtained and scanned into the chart.  Lesion of concern was identified and located on right thumb.  Liquid nitrogen was applied to area of concern and extending out 1 millimeters beyond the border of the lesion.  Treated area was allowed to come back to room temperature before treating it a second time.  Patient tolerated procedure well and there were no immediate complications.  Home care instructions were reviewed with the patient and a handout was provided.  Assessment/ Plan: Seth Mcmillan here for annual physical exam.   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  He will schedule appointment for Monday.  Blood pressure is not controlled so blood pressure regimen was advanced to 10 mg of Norvasc daily.  Discussed goal of less than 140/90  Continue current cholesterol regimen.  Check lipid Monday  Check CBC, testosterone level and PSA given low  testosterone  Lexapro renewed.  Anxiety is stable  GERD stable.  PPI renewed  I advanced his Celebrex to 200 mg as needed.  Use cautiously.  Avoid other NSAIDs  Screening HIV to be collected with Monday labs  Wart treated with cryoablation today.  Tolerated procedure without difficulty.   Counseled on healthy lifestyle choices, including diet (rich in fruits, vegetables and lean meats and low in salt and simple carbohydrates) and exercise (at least 30 minutes of moderate physical activity daily).  Patient to follow up 55m for BP check  Ria Redcay M. Nadine Counts, DO

## 2023-01-02 ENCOUNTER — Other Ambulatory Visit: Payer: 59

## 2023-01-02 ENCOUNTER — Encounter: Payer: Self-pay | Admitting: Family Medicine

## 2023-01-02 DIAGNOSIS — Z87891 Personal history of nicotine dependence: Secondary | ICD-10-CM

## 2023-01-02 DIAGNOSIS — Z114 Encounter for screening for human immunodeficiency virus [HIV]: Secondary | ICD-10-CM

## 2023-01-02 DIAGNOSIS — Z125 Encounter for screening for malignant neoplasm of prostate: Secondary | ICD-10-CM

## 2023-01-02 DIAGNOSIS — E782 Mixed hyperlipidemia: Secondary | ICD-10-CM

## 2023-01-02 DIAGNOSIS — I10 Essential (primary) hypertension: Secondary | ICD-10-CM

## 2023-01-03 ENCOUNTER — Encounter: Payer: 59 | Admitting: Family Medicine

## 2023-01-03 LAB — CMP14+EGFR
ALT: 29 [IU]/L (ref 0–44)
AST: 28 [IU]/L (ref 0–40)
Albumin: 4.4 g/dL (ref 3.8–4.9)
Alkaline Phosphatase: 58 [IU]/L (ref 44–121)
BUN/Creatinine Ratio: 14 (ref 9–20)
BUN: 13 mg/dL (ref 6–24)
Bilirubin Total: 0.4 mg/dL (ref 0.0–1.2)
CO2: 22 mmol/L (ref 20–29)
Calcium: 9 mg/dL (ref 8.7–10.2)
Chloride: 100 mmol/L (ref 96–106)
Creatinine, Ser: 0.93 mg/dL (ref 0.76–1.27)
Globulin, Total: 2.5 g/dL (ref 1.5–4.5)
Glucose: 89 mg/dL (ref 70–99)
Potassium: 4.5 mmol/L (ref 3.5–5.2)
Sodium: 138 mmol/L (ref 134–144)
Total Protein: 6.9 g/dL (ref 6.0–8.5)
eGFR: 95 mL/min/{1.73_m2} (ref >59)

## 2023-01-03 LAB — CBC WITH DIFFERENTIAL/PLATELET
Basophils Absolute: 0.1 10*3/uL (ref 0.0–0.2)
Basos: 1 %
EOS (ABSOLUTE): 0.1 10*3/uL (ref 0.0–0.4)
Eos: 2 %
Hematocrit: 42.8 % (ref 37.5–51.0)
Hemoglobin: 14.2 g/dL (ref 13.0–17.7)
Immature Grans (Abs): 0 10*3/uL (ref 0.0–0.1)
Immature Granulocytes: 0 %
Lymphocytes Absolute: 1.6 10*3/uL (ref 0.7–3.1)
Lymphs: 34 %
MCH: 31.4 pg (ref 26.6–33.0)
MCHC: 33.2 g/dL (ref 31.5–35.7)
MCV: 95 fL (ref 79–97)
Monocytes Absolute: 0.6 10*3/uL (ref 0.1–0.9)
Monocytes: 12 %
Neutrophils Absolute: 2.4 10*3/uL (ref 1.4–7.0)
Neutrophils: 51 %
Platelets: 235 10*3/uL (ref 150–450)
RBC: 4.52 x10E6/uL (ref 4.14–5.80)
RDW: 13.1 % (ref 11.6–15.4)
WBC: 4.7 10*3/uL (ref 3.4–10.8)

## 2023-01-03 LAB — LIPID PANEL
Chol/HDL Ratio: 5.2 {ratio} — ABNORMAL HIGH (ref 0.0–5.0)
Cholesterol, Total: 296 mg/dL — ABNORMAL HIGH (ref 100–199)
HDL: 57 mg/dL (ref >39)
LDL Chol Calc (NIH): 130 mg/dL — ABNORMAL HIGH (ref 0–99)
Triglycerides: 599 mg/dL (ref 0–149)
VLDL Cholesterol Cal: 109 mg/dL — ABNORMAL HIGH (ref 5–40)

## 2023-01-03 LAB — PSA: Prostate Specific Ag, Serum: 4.3 ng/mL — ABNORMAL HIGH (ref 0.0–4.0)

## 2023-01-03 LAB — HIV ANTIBODY (ROUTINE TESTING W REFLEX): HIV Screen 4th Generation wRfx: NONREACTIVE

## 2023-01-06 ENCOUNTER — Other Ambulatory Visit: Payer: Self-pay | Admitting: Family Medicine

## 2023-01-06 DIAGNOSIS — R972 Elevated prostate specific antigen [PSA]: Secondary | ICD-10-CM

## 2023-01-06 DIAGNOSIS — E782 Mixed hyperlipidemia: Secondary | ICD-10-CM

## 2023-03-04 ENCOUNTER — Encounter: Payer: Self-pay | Admitting: Gastroenterology

## 2023-03-20 ENCOUNTER — Telehealth: Payer: Self-pay | Admitting: Internal Medicine

## 2023-03-20 NOTE — Telephone Encounter (Signed)
 Papers signed

## 2023-03-20 NOTE — Telephone Encounter (Signed)
 FMLA papers are a renewal and have been filled out from the last ones done by Dr. Marletta Lor. Forms is in your box to be signed.

## 2023-03-20 NOTE — Telephone Encounter (Signed)
 There were FMLA papers laying at the front door this morning when I went to unlock the gate. Patient of Dr Queen Blossom but last seen by LSL in July 2024. Papers were given the T. Clifton.

## 2023-03-21 DIAGNOSIS — Z0279 Encounter for issue of other medical certificate: Secondary | ICD-10-CM

## 2023-04-03 NOTE — Progress Notes (Signed)
 Attempted to reach patient regarding follow-up LDCT. Unable to reach patient directly. Detailed VM left asking that the patient return my call.

## 2023-04-11 ENCOUNTER — Encounter: Payer: Self-pay | Admitting: Family Medicine

## 2023-04-11 ENCOUNTER — Other Ambulatory Visit: Payer: 59

## 2023-04-11 ENCOUNTER — Ambulatory Visit: Payer: 59 | Admitting: Family Medicine

## 2023-04-11 VITALS — BP 131/74 | HR 100 | Temp 97.9°F | Ht 71.0 in | Wt 195.6 lb

## 2023-04-11 DIAGNOSIS — R7989 Other specified abnormal findings of blood chemistry: Secondary | ICD-10-CM

## 2023-04-11 DIAGNOSIS — E781 Pure hyperglyceridemia: Secondary | ICD-10-CM

## 2023-04-11 DIAGNOSIS — C61 Malignant neoplasm of prostate: Secondary | ICD-10-CM | POA: Diagnosis not present

## 2023-04-11 DIAGNOSIS — E782 Mixed hyperlipidemia: Secondary | ICD-10-CM | POA: Diagnosis not present

## 2023-04-11 DIAGNOSIS — R972 Elevated prostate specific antigen [PSA]: Secondary | ICD-10-CM

## 2023-04-11 NOTE — Progress Notes (Signed)
Subjective: CC: Elevated PSA, hypertriglyceridemia PCP: [name], DO JYN:WGNF Seth Mcmillan is a 58 y.o. male presenting to clinic today for:  1.  Elevated PSA After further discussion patient actually reports that he has a history of prostate cancer but has been lost to follow-up with urology for several years now.  He was initially diagnosed by Dr. Nechama Guard in [place] but Dr. Nechama Guard moved to [place].  He was transitioned over to Dr. Dimas Millin care in [place] but they had recommended repeat prostate biopsy and he was not willing to do this so he was lost to follow-up.  He denies any dysuria, hematuria, difficulty with starting urinary stream.  No pelvic pain.  Occasionally does have nocturia but he notes that this is dependent upon what he drank the night before  2.  Hypertriglyceridemia Compliant with Crestor and Tricor.  No chest pain, shortness of breath, abdominal pain.  Has history of pancreatitis.  He does drink up to 3 beers per night.  Tries to incorporate high fiber to the diet.   ROS: Per HPI  No Known Allergies Past Medical History:  Diagnosis Date   Anxiety    Arthritis    Depression    Fatty liver    GERD (gastroesophageal reflux disease)    Hyperlipidemia    Hypertension    IBS (irritable bowel syndrome)    Diarrhea type    Current Outpatient Medications:    amLODipine (NORVASC) 10 MG tablet, Take 1 tablet (10 mg total) by mouth daily. **Dose change, Disp: 90 tablet, Rfl: 3   Ascorbic Acid (VITAMIN C) 1000 MG tablet, Take 1,000 mg by mouth daily., Disp: , Rfl:    aspirin EC 81 MG tablet, Take 81 mg by mouth daily., Disp: , Rfl:    B Complex Vitamins (B COMPLEX PO), Take by mouth., Disp: , Rfl:    celecoxib (CELEBREX) 200 MG capsule, Take 1 capsule (200 mg total) by mouth daily as needed for moderate pain (pain score 4-6) (NO NSAIDS when taking)., Disp: 90 capsule, Rfl: 3   cholecalciferol (VITAMIN D3) 25 MCG (1000 UT) tablet, Take 1,000 Units by mouth daily.,  Disp: , Rfl:    escitalopram (LEXAPRO) 10 MG tablet, Take 1 tablet (10 mg total) by mouth daily., Disp: 90 tablet, Rfl: 3   fenofibrate (TRICOR) 145 MG tablet, Take 1 tablet (145 mg total) by mouth daily., Disp: 90 tablet, Rfl: 3   hydrOXYzine (VISTARIL) 25 MG capsule, Take 1 capsule (25 mg total) by mouth every 8 (eight) hours as needed., Disp: 30 capsule, Rfl: 0   omeprazole (PRILOSEC) 40 MG capsule, Take 1 capsule (40 mg total) by mouth daily., Disp: 90 capsule, Rfl: 3   ondansetron (ZOFRAN) 4 MG tablet, Take 1 tablet (4 mg total) by mouth every 8 (eight) hours as needed., Disp: 30 tablet, Rfl: 0   rosuvastatin (CRESTOR) 40 MG tablet, Take 1 tablet (40 mg total) by mouth daily., Disp: 90 tablet, Rfl: 3   VITAMIN E PO, Take by mouth daily., Disp: , Rfl:  Social History   Socioeconomic History   Marital status: Divorced    Spouse name: Not on file   Number of children: Not on file   Years of education: Not on file   Highest education level: Not on file  Occupational History   Not on file  Tobacco Use   Smoking status: Former    Current packs/day: 0.00    Average packs/day: 1 pack/day for 25.0 years (25.0 ttl pk-yrs)  Types: Cigarettes    Start date: 07/16/1992    Quit date: 07/16/2017    Years since quitting: 5.7    Passive exposure: Past   Smokeless tobacco: Never   Tobacco comments:    Pt smokes cigar daily   Vaping Use   Vaping status: Never Used  Substance and Sexual Activity   Alcohol use: Yes    Alcohol/week: 18.0 standard drinks of alcohol    Types: 18 Cans of beer per week   Drug use: No   Sexual activity: Not on file  Other Topics Concern   Not on file  Social History Narrative   Works at a cigarette factory   Social Drivers of Corporate investment banker Strain: Not on file  Food Insecurity: Not on file  Transportation Needs: Not on file  Physical Activity: Not on file  Stress: Not on file  Social Connections: Not on file  Intimate Partner Violence: Not on  file   Family History  Problem Relation Age of Onset   Heart disease Mother    Dementia Mother    Heart disease Father    Diabetes Brother    Sleep apnea Nephew    Colon cancer Neg Hx     Objective: Office vital signs reviewed. BP 131/74   Pulse 100   Temp 97.9 F (36.6 C)   Ht 5\' 11"  (1.803 m)   Wt 195 lb 9.6 oz (88.7 kg)   SpO2 98%   BMI 27.28 kg/m   Physical Examination:  General: Awake, alert, well nourished, No acute distress HEENT: Moist mucous membranes.  Sclera white Cardio: regular rate and rhythm, S1S2 heard, no murmurs appreciated Pulm: clear to auscultation bilaterally, no wheezes, rhonchi or rales; normal work of breathing on room air GI: Soft, nontender.  Nondistended  Assessment/ Plan: 58 y.o. male   Mixed hyperlipidemia  High triglycerides  Prostate cancer (HCC) - Plan: Ambulatory referral to Urology  Repeat fasting lipid collected this morning.  We discussed the risk of pancreatitis with the elevation in triglycerides.  He is compliant with Crestor and Tricor and we discussed possible need to add something like Vascepa versus referral to a lipid allergist for further evaluation.  Has a history of prostate cancer which I was not aware of.  He actually has not been seeing urology for many years now because he did not want to have a repeat prostate biopsy with Dr. Ronne Binning and so was lost to follow-up.  His PSA was slightly up in October and we repeated this again this morning.  No ejaculation in the last 24 hours.  I am referring him to a new urologist closer to his work in [place].  He is totally asymptomatic at this time   [name], DO [facility] [phone]

## 2023-04-12 LAB — LIPID PANEL
Chol/HDL Ratio: 4 {ratio} (ref 0.0–5.0)
Cholesterol, Total: 259 mg/dL — ABNORMAL HIGH (ref 100–199)
HDL: 65 mg/dL (ref >39)
LDL Chol Calc (NIH): 145 mg/dL — ABNORMAL HIGH (ref 0–99)
Triglycerides: 273 mg/dL — ABNORMAL HIGH (ref 0–149)
VLDL Cholesterol Cal: 49 mg/dL — ABNORMAL HIGH (ref 5–40)

## 2023-04-12 LAB — TESTOSTERONE: Testosterone: 284 ng/dL (ref 264–916)

## 2023-04-12 LAB — PSA: Prostate Specific Ag, Serum: 5.1 ng/mL — ABNORMAL HIGH (ref 0.0–4.0)

## 2023-04-14 ENCOUNTER — Other Ambulatory Visit: Payer: Self-pay | Admitting: Family Medicine

## 2023-04-14 ENCOUNTER — Encounter: Payer: Self-pay | Admitting: Family Medicine

## 2023-04-14 DIAGNOSIS — E782 Mixed hyperlipidemia: Secondary | ICD-10-CM

## 2023-04-14 DIAGNOSIS — E781 Pure hyperglyceridemia: Secondary | ICD-10-CM

## 2023-04-14 NOTE — Progress Notes (Deleted)
 GI Office Note    Referring Provider: [name], DO Primary Care Physician:  [name], DO  Primary Gastroenterologist: Hennie Duos. Marletta Lor, DO   Chief Complaint   No chief complaint on file.   History of Present Illness   Seth Mcmillan is a 58 y.o. male presenting today for follow-up.  Last seen in July 2024.  History of IBS-D, GERD, fatty liver.  Colonoscopy 05/25/2019 unremarkable with a 5-year recall due to surveillance. EGD at the same time showed mild gastritis, biopsies negative for H. pylori. Did have a Schatzki's ring that was not manipulated. In 2017, random colon biopsies negative.celiac screen negative.  Has been treated with Xifaxan twice in the past, last time in 08/2020.   Labs from January 2025: Total cholesterol 259, triglycerides 273, HDL 65, LDL 145, PSA 5.1.  Labs from October 2024: Creatinine 0.93, total bilirubin 0.4, alkaline phosphatase 58, AST 28, ALT 29.  Total cholesterol 296, triglycerides 599, LDL 130, HDL 57, white blood cell count 4700, hemoglobin 14.2, platelets 235,000, PSA 4.3    Medications   Current Outpatient Medications  Medication Sig Dispense Refill   amLODipine (NORVASC) 10 MG tablet Take 1 tablet (10 mg total) by mouth daily. **Dose change 90 tablet 3   Ascorbic Acid (VITAMIN C) 1000 MG tablet Take 1,000 mg by mouth daily.     aspirin EC 81 MG tablet Take 81 mg by mouth daily.     B Complex Vitamins (B COMPLEX PO) Take by mouth.     celecoxib (CELEBREX) 200 MG capsule Take 1 capsule (200 mg total) by mouth daily as needed for moderate pain (pain score 4-6) (NO NSAIDS when taking). 90 capsule 3   cholecalciferol (VITAMIN D3) 25 MCG (1000 UT) tablet Take 1,000 Units by mouth daily.     escitalopram (LEXAPRO) 10 MG tablet Take 1 tablet (10 mg total) by mouth daily. 90 tablet 3   fenofibrate (TRICOR) 145 MG tablet Take 1 tablet (145 mg total) by mouth daily. 90 tablet 3   hydrOXYzine (VISTARIL) 25 MG capsule Take 1  capsule (25 mg total) by mouth every 8 (eight) hours as needed. 30 capsule 0   omeprazole (PRILOSEC) 40 MG capsule Take 1 capsule (40 mg total) by mouth daily. 90 capsule 3   ondansetron (ZOFRAN) 4 MG tablet Take 1 tablet (4 mg total) by mouth every 8 (eight) hours as needed. 30 tablet 0   rosuvastatin (CRESTOR) 40 MG tablet Take 1 tablet (40 mg total) by mouth daily. 90 tablet 3   VITAMIN E PO Take by mouth daily.     No current facility-administered medications for this visit.    Allergies   Allergies as of 04/15/2023   (No Known Allergies)     Past Medical History   Past Medical History:  Diagnosis Date   Anxiety    Arthritis    Depression    Fatty liver    GERD (gastroesophageal reflux disease)    Hyperlipidemia    Hypertension    IBS (irritable bowel syndrome)    Diarrhea type    Past Surgical History   Past Surgical History:  Procedure Laterality Date   ANTERIOR CERVICAL DECOMP/DISCECTOMY FUSION N/A 05/18/2018   Procedure: C6-7. C7-T1 ANTERIOR CERVICAL DECOMPRESSION/DISCECTOMY FUSION, ALLOGRAFT, PLATE;  Surgeon: Eldred Manges, MD;  Location: [facility] OR;  Service: Orthopedics;  Laterality: N/A;   BIOPSY  09/05/2015   Procedure: BIOPSY;  Surgeon: West Bali, MD;  Location: [facility]  SU[facility];  Service: Endoscopy;;  random colon bx   BIOPSY  05/25/2019   Procedure: BIOPSY;  Surgeon: West Bali, MD;  Location: [facility];  Service: Endoscopy;;   COLONOSCOPY N/A 09/05/2015   Dr. Darrick Penna: 4 simple adenomas removed.  Next colonoscopy 3 years.   COLONOSCOPY WITH PROPOFOL N/A 05/25/2019   Procedure: COLONOSCOPY WITH PROPOFOL;  Surgeon: West Bali, MD;  Location: [facility];  Service: Endoscopy;  Laterality: N/A;  12:15pm   ESOPHAGOGASTRODUODENOSCOPY (EGD) WITH PROPOFOL N/A 05/25/2019   Procedure: ESOPHAGOGASTRODUODENOSCOPY (EGD) WITH PROPOFOL;  Surgeon: West Bali, MD;  Location: [facility];  Service: Endoscopy;  Laterality: N/A;   KNEE SURGERY Right 1997   bursa  sac   POLYPECTOMY  09/05/2015   Procedure: POLYPECTOMY;  Surgeon: West Bali, MD;  Location: [facility];  Service: Endoscopy;;  colon polyps   SHOULDER ARTHROSCOPY WITH BICEPS TENDON REPAIR Right 2011   TONSILECTOMY, ADENOIDECTOMY, BILATERAL MYRINGOTOMY AND TUBES      Past Family History   Family History  Problem Relation Age of Onset   Heart disease Mother    Dementia Mother    Heart disease Father    Diabetes Brother    Sleep apnea Nephew    Colon cancer Neg Hx     Past Social History   Social History   Socioeconomic History   Marital status: Divorced    Spouse name: Not on file   Number of children: Not on file   Years of education: Not on file   Highest education level: Not on file  Occupational History   Not on file  Tobacco Use   Smoking status: Former    Current packs/day: 0.00    Average packs/day: 1 pack/day for 25.0 years (25.0 ttl pk-yrs)    Types: Cigarettes    Start date: 07/16/1992    Quit date: 07/16/2017    Years since quitting: 5.7    Passive exposure: Past   Smokeless tobacco: Never   Tobacco comments:    Pt smokes cigar daily   Vaping Use   Vaping status: Never Used  Substance and Sexual Activity   Alcohol use: Yes    Alcohol/week: 18.0 standard drinks of alcohol    Types: 18 Cans of beer per week   Drug use: No   Sexual activity: Not on file  Other Topics Concern   Not on file  Social History Narrative   Works at a cigarette factory   Social Drivers of Corporate investment banker Strain: Not on file  Food Insecurity: Not on file  Transportation Needs: Not on file  Physical Activity: Not on file  Stress: Not on file  Social Connections: Not on file  Intimate Partner Violence: Not on file    Review of Systems   General: Negative for anorexia, weight loss, fever, chills, fatigue, weakness. ENT: Negative for hoarseness, difficulty swallowing , nasal congestion. CV: Negative for chest pain, angina, palpitations, dyspnea on  exertion, peripheral edema.  Respiratory: Negative for dyspnea at rest, dyspnea on exertion, cough, sputum, wheezing.  GI: See history of present illness. GU:  Negative for dysuria, hematuria, urinary incontinence, urinary frequency, nocturnal urination.  Endo: Negative for unusual weight change.     Physical Exam   There were no vitals taken for this visit.   General: Well-nourished, well-developed in no acute distress.  Eyes: No icterus. Mouth: Oropharyngeal mucosa moist and pink , no lesions erythema or exudate. Lungs: Clear to auscultation bilaterally.  Heart: Regular rate and rhythm, no murmurs rubs or gallops.  Abdomen: Bowel sounds are normal, nontender, nondistended, no hepatosplenomegaly or masses,  no abdominal bruits or hernia , no rebound or guarding.  Rectal: ***  Extremities: No lower extremity edema. No clubbing or deformities. Neuro: Alert and oriented x 4   Skin: Warm and dry, no jaundice.   Psych: Alert and cooperative, normal mood and affect.  Labs   *** Imaging Studies   No results found.  Assessment       PLAN   ***   Leanna Battles. Melvyn Neth, MHS, PA-C [facility]

## 2023-04-15 ENCOUNTER — Ambulatory Visit: Payer: 59 | Admitting: Gastroenterology

## 2023-04-21 ENCOUNTER — Telehealth: Payer: Self-pay

## 2023-04-21 NOTE — Progress Notes (Signed)
Care Guide Pharmacy Note  04/21/2023 Name: Seth Mcmillan MRN: [id number] DOB: [birth year]  Referred By: [name], DO Reason for referral: Care Coordination (Outreach to schedule with Pharm d )   Seth Mcmillan is a 58 y.o. year old male who is a primary care patient of [name], DO.  Seth Mcmillan was referred to the pharmacist for assistance related to: HLD  Successful contact was made with the patient to discuss pharmacy services including being ready for the pharmacist to call at least 5 minutes before the scheduled appointment time and to have medication bottles and any blood pressure readings ready for review. The patient agreed to meet with the pharmacist via telephone visit on (date/time).05/22/2023  Penne Lash , RMA     [place]  [facility], [facility] Guide  Direct Dial: [phone]  Website: [email or web address]

## 2023-04-22 ENCOUNTER — Ambulatory Visit: Payer: 59 | Admitting: Gastroenterology

## 2023-04-22 NOTE — Progress Notes (Signed)
 Attempted to reach patient regarding follow-up LDCT. Unable to reach patient directly. Detailed VM left asking that the patient return my call.

## 2023-05-05 ENCOUNTER — Ambulatory Visit: Payer: 59 | Admitting: Gastroenterology

## 2023-05-13 ENCOUNTER — Ambulatory Visit: Payer: 59 | Admitting: Gastroenterology

## 2023-05-16 ENCOUNTER — Encounter: Payer: Self-pay | Admitting: Gastroenterology

## 2023-05-16 ENCOUNTER — Ambulatory Visit (INDEPENDENT_AMBULATORY_CARE_PROVIDER_SITE_OTHER): Payer: 59 | Admitting: Gastroenterology

## 2023-05-16 VITALS — BP 152/83 | Temp 98.7°F | Ht 71.0 in | Wt 192.0 lb

## 2023-05-16 DIAGNOSIS — F1090 Alcohol use, unspecified, uncomplicated: Secondary | ICD-10-CM | POA: Diagnosis not present

## 2023-05-16 DIAGNOSIS — K760 Fatty (change of) liver, not elsewhere classified: Secondary | ICD-10-CM

## 2023-05-16 DIAGNOSIS — E7800 Pure hypercholesterolemia, unspecified: Secondary | ICD-10-CM

## 2023-05-16 DIAGNOSIS — K580 Irritable bowel syndrome with diarrhea: Secondary | ICD-10-CM | POA: Diagnosis not present

## 2023-05-16 DIAGNOSIS — K219 Gastro-esophageal reflux disease without esophagitis: Secondary | ICD-10-CM

## 2023-05-16 NOTE — Progress Notes (Signed)
 GI Office Note    Referring Provider: [name], DO Primary Care Physician:  [name], DO  Primary Gastroenterologist: Hennie Duos. Marletta Lor, DO   Chief Complaint   Chief Complaint  Patient presents with   Follow-up    Follow up on IBS-D. Pt states he is doing about the same just a little better    History of Present Illness   Seth Mcmillan is a 58 y.o. male presenting today for follow up. Last seen 09/2022. H/o IBS-D, GERD, fatty liver.   Colonoscopy 05/25/2019 unremarkable with a 5-year recall due to surveillance. EGD at the same time showed mild gastritis, biopsies negative for H. pylori. Did have a Schatzki's ring that was not manipulated. In 2017, random colon biopsies negative.celiac screen negative.  Has been treated with Xifaxan twice in the past, last time in 08/2020.   Today: Overall doing well from a GI standpoint.  Continues to have a lot of stress at work.  Plans to retire in July when he turns 58.  Working a lot of overtime.  His reflux is well-controlled on omeprazole.  If he misses omeprazole more than a couple of days he has recurrent symptoms.  Denies any dysphagia, vomiting, abdominal pain.  Bowel movements overall controllable, flare with diarrhea at times.  No melena or rectal bleeding.  Patient continues to drink alcohol.  Consuming 1-2 beers daily.  Cholesterol 259, LDL 145.  Triglycerides 273.  PCP working on his cholesterol.  Currently on Tricor and Crestor.  Patient states that he has had a longstanding history of prostate cancer, has been watching over the years.  His initial urologist left, when he saw his new urologist he wanted to do a prostate biopsy right away but he never followed through, having had biopsy of a year prior.  Recently his PSA started to go up.  He was sent to see a new urologist, Dr. Heloise Purpura.  He is scheduled for MRI in the near future.  Medications   Current Outpatient Medications  Medication Sig Dispense Refill    amLODipine (NORVASC) 10 MG tablet Take 1 tablet (10 mg total) by mouth daily. **Dose change 90 tablet 3   Ascorbic Acid (VITAMIN C) 1000 MG tablet Take 1,000 mg by mouth daily.     aspirin EC 81 MG tablet Take 81 mg by mouth daily.     B Complex Vitamins (B COMPLEX PO) Take by mouth.     celecoxib (CELEBREX) 200 MG capsule Take 1 capsule (200 mg total) by mouth daily as needed for moderate pain (pain score 4-6) (NO NSAIDS when taking). 90 capsule 3   cholecalciferol (VITAMIN D3) 25 MCG (1000 UT) tablet Take 1,000 Units by mouth daily.     escitalopram (LEXAPRO) 10 MG tablet Take 1 tablet (10 mg total) by mouth daily. 90 tablet 3   fenofibrate (TRICOR) 145 MG tablet Take 1 tablet (145 mg total) by mouth daily. 90 tablet 3   omeprazole (PRILOSEC) 40 MG capsule Take 1 capsule (40 mg total) by mouth daily. 90 capsule 3   ondansetron (ZOFRAN) 4 MG tablet Take 1 tablet (4 mg total) by mouth every 8 (eight) hours as needed. 30 tablet 0   rosuvastatin (CRESTOR) 40 MG tablet Take 1 tablet (40 mg total) by mouth daily. 90 tablet 3   VITAMIN E PO Take by mouth daily.     No current facility-administered medications for this visit.    Allergies   Allergies as of  05/16/2023   (No Known Allergies)      Review of Systems   General: Negative for anorexia, weight loss, fever, chills, fatigue, weakness. ENT: Negative for hoarseness, difficulty swallowing , nasal congestion. CV: Negative for chest pain, angina, palpitations, dyspnea on exertion, peripheral edema.  Respiratory: Negative for dyspnea at rest, dyspnea on exertion, cough, sputum, wheezing.  GI: See history of present illness. GU:  Negative for dysuria, hematuria, urinary incontinence, urinary frequency, nocturnal urination.  Endo: Negative for unusual weight change.     Physical Exam   BP (!) 152/83   Temp 98.7 F (37.1 C)   Ht 5\' 11"  (1.803 m)   Wt 192 lb (87.1 kg)   BMI 26.78 kg/m    General: Well-nourished, well-developed in  no acute distress.  Eyes: No icterus. Mouth: Oropharyngeal mucosa moist and pink   Abdomen: Bowel sounds are normal, nontender, nondistended, no hepatosplenomegaly or masses,  no abdominal bruits or hernia , no rebound or guarding.  Rectal: not performed  Extremities: No lower extremity edema. No clubbing or deformities. Neuro: Alert and oriented x 4   Skin: Warm and dry, no jaundice.   Psych: Alert and cooperative, normal mood and affect.  Labs   Lab Results  Component Value Date   NA 138 01/02/2023   CL 100 01/02/2023   K 4.5 01/02/2023   CO2 22 01/02/2023   BUN 13 01/02/2023   CREATININE 0.93 01/02/2023   EGFR 95 01/02/2023   CALCIUM 9.0 01/02/2023   ALBUMIN 4.4 01/02/2023   GLUCOSE 89 01/02/2023   Lab Results  Component Value Date   ALT 29 01/02/2023   AST 28 01/02/2023   ALKPHOS 58 01/02/2023   BILITOT 0.4 01/02/2023   Lab Results  Component Value Date   WBC 4.7 01/02/2023   HGB 14.2 01/02/2023   HCT 42.8 01/02/2023   MCV 95 01/02/2023   PLT 235 01/02/2023    Imaging Studies   No results found.  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 Patient wanted to hold off right now given pending work up of prostate -request MRI results to be forwarded to [place], may image liver at same time -recommend low fat/low sugar diet -ov in one year.    The patient was found to have elevated blood pressure when vital signs were checked in the office. The blood pressure was rechecked by the nursing staff and it was found be persistently elevated >140/90 mmHg. I personally advised to the patient to follow up closely with his PCP for hypertension  control.     Leanna Battles. Melvyn Neth, MHS, PA-C [facility]

## 2023-05-16 NOTE — Patient Instructions (Addendum)
 Continue omeprazole 40 mg daily before breakfast. Continue Imodium 2 mg up to twice daily as needed for diarrhea. Given your history of fatty liver, I would advise that she cut back on alcohol consumption.  This will help preserve healthy liver cells.  We will continue to monitor yearly, consider updating liver ultrasound next year. Request MRI results to be sent to [place] for review. Return to the office in 1 year or sooner if needed.

## 2023-05-19 ENCOUNTER — Encounter: Payer: Self-pay | Admitting: Family Medicine

## 2023-05-19 ENCOUNTER — Other Ambulatory Visit: Payer: Self-pay | Admitting: Urology

## 2023-05-19 DIAGNOSIS — C61 Malignant neoplasm of prostate: Secondary | ICD-10-CM

## 2023-05-22 ENCOUNTER — Other Ambulatory Visit (INDEPENDENT_AMBULATORY_CARE_PROVIDER_SITE_OTHER): Payer: 59

## 2023-05-22 DIAGNOSIS — E782 Mixed hyperlipidemia: Secondary | ICD-10-CM

## 2023-05-22 MED ORDER — EZETIMIBE 10 MG PO TABS: 10.0000 mg | ORAL_TABLET | Freq: Every day | ORAL | 3 refills | Status: AC

## 2023-05-22 NOTE — Progress Notes (Signed)
   05/22/2023 Name: Seth Mcmillan MRN: [id number] DOB: [birth year]  Hyperlipidemia  Seth Mcmillan is a 58 y.o. year old male who presented for a telephone visit.   They were referred to the pharmacist by their PCP for assistance in managing hyperlipidemia.    Subjective:  Care Team: Primary Care Provider: [name], DO    Medication Access/Adherence  Current Pharmacy:  [facility] [address] [place] - [name] [place] [address] Phone: [phone] Fax: [phone]   Patient reports affordability concerns with their medications: No  Patient reports access/transportation concerns to their pharmacy: No  Patient reports adherence concerns with their medications:  No     Hyperlipidemia/ASCVD Risk Reduction  Current lipid lowering medications: rosuvastatin, fibrate Medications tried in the past:   Antiplatelet regimen: asa  Current physical activity: encouraged as able  Current medication access support: n/a  Clinical ASCVD: No  The 10-year ASCVD risk score (Arnett DK, et al., 2019) is: 13%   Values used to calculate the score:     Age: 58 years     Sex: Male     Is Non-Hispanic African American: No     Diabetic: No     Tobacco smoker: No     Systolic Blood Pressure: 152 mmHg     Is BP treated: Yes     HDL Cholesterol: 65 mg/dL     Total Cholesterol: 259 mg/dL   Objective:  No results found for: "HGBA1C"  Lab Results  Component Value Date   CREATININE 0.93 01/02/2023   BUN 13 01/02/2023   NA 138 01/02/2023   K 4.5 01/02/2023   CL 100 01/02/2023   CO2 22 01/02/2023    Lab Results  Component Value Date   CHOL 259 (H) 04/11/2023   HDL 65 04/11/2023   LDLCALC 145 (H) 04/11/2023   TRIG 273 (H) 04/11/2023   CHOLHDL 4.0 04/11/2023    Medications Reviewed Today   Medications were not reviewed in this encounter       Assessment/Plan:   Hyperlipidemia/ASCVD Risk Reduction: - Currently uncontrolled.  - Reviewed long  term complications of uncontrolled cholesterol - Reviewed dietary recommendations including FOLLOWING A HEART HEALTHY DIET/HEALTHY PLATE METHOD - Recommend to: Continue all meds as prescribed Continue rosuvastatin Add zetia Respectfully declines PCSK9 (repatha) Patient working on diet/lifestyle    Follow Up Plan: PCP  Kieth Brightly, PharmD, BCACP, CPP Clinical Pharmacist, [facility]

## 2023-06-06 ENCOUNTER — Ambulatory Visit
Admission: RE | Admit: 2023-06-06 | Discharge: 2023-06-06 | Disposition: A | Discharge status: Home / Self Care | Source: Ambulatory Visit | Attending: Urology | Admitting: Urology

## 2023-06-06 DIAGNOSIS — C61 Malignant neoplasm of prostate: Secondary | ICD-10-CM

## 2023-06-20 NOTE — Progress Notes (Signed)
 Attempted to reach patient regarding follow-up LDCT. Unable to reach patient directly, no VM available

## 2023-06-25 ENCOUNTER — Ambulatory Visit
Admission: RE | Admit: 2023-06-25 | Discharge: 2023-06-25 | Disposition: A | Discharge status: Home / Self Care | Source: Ambulatory Visit | Attending: Urology | Admitting: Urology

## 2023-06-25 MED ORDER — GADOPICLENOL 0.5 MMOL/ML IV SOLN
10.0000 mL | Freq: Once | INTRAVENOUS | Status: AC | PRN
  Administered 2023-06-25: 10 mL via INTRAVENOUS

## 2023-07-11 ENCOUNTER — Ambulatory Visit: Payer: 59 | Admitting: Family Medicine

## 2023-07-11 ENCOUNTER — Telehealth: Payer: Self-pay

## 2023-07-11 DIAGNOSIS — C61 Malignant neoplasm of prostate: Secondary | ICD-10-CM

## 2023-07-11 DIAGNOSIS — E781 Pure hyperglyceridemia: Secondary | ICD-10-CM

## 2023-07-11 DIAGNOSIS — E782 Mixed hyperlipidemia: Secondary | ICD-10-CM

## 2023-07-11 NOTE — Telephone Encounter (Signed)
 Pt does not need todays apt , he was schedule wrong by the front pt was told to come back in 6-7 months not 3   If pt would like to keep apt for other concerns that is fine but if not please cancel apt if pt calls back

## 2023-08-06 ENCOUNTER — Other Ambulatory Visit (HOSPITAL_COMMUNITY): Payer: Self-pay | Admitting: Urology

## 2023-08-06 DIAGNOSIS — C61 Malignant neoplasm of prostate: Secondary | ICD-10-CM

## 2023-08-29 ENCOUNTER — Encounter (HOSPITAL_COMMUNITY)
Admission: RE | Admit: 2023-08-29 | Discharge: 2023-08-29 | Disposition: A | Discharge status: Home / Self Care | Source: Ambulatory Visit | Attending: Urology | Admitting: Urology

## 2023-08-29 DIAGNOSIS — C61 Malignant neoplasm of prostate: Secondary | ICD-10-CM | POA: Insufficient documentation

## 2023-08-29 MED ORDER — FLOTUFOLASTAT F 18 GALLIUM 296-5846 MBQ/ML IV SOLN
8.6750 | Freq: Once | INTRAVENOUS | Status: AC
  Administered 2023-08-29: 8.675 via INTRAVENOUS

## 2023-09-02 ENCOUNTER — Telehealth: Payer: Self-pay

## 2023-09-02 NOTE — Telephone Encounter (Signed)
 LVM to patient in reference to upcoming [facility] appointment on 7/11 arrive at 8 am, left my contact to call back, packet already sent

## 2023-09-02 NOTE — Telephone Encounter (Signed)
 Patient returned my call, about upcoming appointment    1. I confirmed with the patient he is aware of his referral to the clinic 7/11, arriving @ 800 am.    2. I discussed the format of the clinic and the physicians he will be seeing that day.   3. I discussed where the clinic is located and how to contact me.   4. I confirmed his address and informed him I would be mailing a packet of information and forms to be completed. I asked him to bring them with him the day of his appointment.    He voiced understanding of the above. I asked him to call me if he has any questions or concerns regarding his appointments or the forms he needs to complete.

## 2023-09-17 ENCOUNTER — Encounter: Payer: Self-pay | Admitting: *Deleted

## 2023-09-17 NOTE — Progress Notes (Signed)
 Attempted to call patient to schedule LDCT.  Patient did not answer, left detailed VM and asked that he return my call to schedule.

## 2023-09-18 ENCOUNTER — Other Ambulatory Visit: Payer: Self-pay | Admitting: *Deleted

## 2023-09-18 DIAGNOSIS — Z87891 Personal history of nicotine dependence: Secondary | ICD-10-CM

## 2023-09-18 DIAGNOSIS — Z122 Encounter for screening for malignant neoplasm of respiratory organs: Secondary | ICD-10-CM

## 2023-09-18 NOTE — Progress Notes (Signed)
 Orders placed for LDCT screening exam.  Patient returned call to clinic and wanted to schedule his screening.  Patient is aware of appt date and time.

## 2023-09-25 ENCOUNTER — Telehealth: Payer: Self-pay

## 2023-09-25 NOTE — Progress Notes (Incomplete)
 Radiation Oncology         [phone] ________________________________  Multidisciplinary Prostate Cancer Clinic  Initial Radiation Oncology Consultation  Name: Seth Mcmillan MRN: [id number]  Date: 09/26/2023  DOB: [birth year]  RR:[name], [name], DO  Renda Glance, MD   REFERRING PHYSICIAN: Renda Glance, MD  DIAGNOSIS: 58 y.o. gentleman with stage T*** adenocarcinoma of the prostate with a Gleason's score of ***+*** and a PSA of ***    ICD-10-CM   1. Prostate cancer (HCC)  C61       HISTORY OF PRESENT ILLNESS::Seth Mcmillan is a 58 y.o. gentleman.  He was noted to have an elevated PSA of *** by his primary care physician, Dr. FERNAND  Accordingly, he was referred for evaluation in urology by Dr. PIERRETTE on ***,  digital rectal examination was performed at that time revealing ***.  The patient proceeded to transrectal ultrasound with 12 biopsies of the prostate on ***.  The prostate volume measured *** cc.  Out of 12 core biopsies,*** were positive.  The maximum Gleason score was ***, and this was seen in ***.  The patient reviewed the biopsy results with his urologist and he has kindly been referred today to the multidisciplinary prostate cancer clinic for presentation of pathology and radiology studies in our conference for discussion of potential radiation treatment options and clinical evaluation.    ***.  PREVIOUS RADIATION THERAPY: {EXAM; YES/NO:19492::No}  PAST MEDICAL HISTORY:  has a past medical history of Anxiety, Arthritis, Depression, Fatty liver, GERD (gastroesophageal reflux disease), Hyperlipidemia, Hypertension, and IBS (irritable bowel syndrome).    PAST SURGICAL HISTORY: Past Surgical History:  Procedure Laterality Date   ANTERIOR CERVICAL DECOMP/DISCECTOMY FUSION N/A 05/18/2018   Procedure: C6-7. C7-T1 ANTERIOR CERVICAL DECOMPRESSION/DISCECTOMY FUSION, ALLOGRAFT, PLATE;  Surgeon: Barbarann Oneil BROCKS, MD;  [facility];  Service: Orthopedics;  Laterality: N/A;    BIOPSY  09/05/2015   Procedure: BIOPSY;  Surgeon: Margo LITTIE Haddock, MD;  [facility];  Service: Endoscopy;;  random colon bx   BIOPSY  05/25/2019   Procedure: BIOPSY;  Surgeon: Haddock Margo LITTIE, MD;  [facility];  Service: Endoscopy;;   COLONOSCOPY N/A 09/05/2015   Dr. haddock: 4 simple adenomas removed.  Next colonoscopy 3 years.   COLONOSCOPY WITH PROPOFOL  N/A 05/25/2019   Procedure: COLONOSCOPY WITH PROPOFOL ;  Surgeon: Haddock Margo LITTIE, MD;  [facility];  Service: Endoscopy;  Laterality: N/A;  12:15pm   ESOPHAGOGASTRODUODENOSCOPY (EGD) WITH PROPOFOL  N/A 05/25/2019   Procedure: ESOPHAGOGASTRODUODENOSCOPY (EGD) WITH PROPOFOL ;  Surgeon: Haddock Margo LITTIE, MD;  [facility];  Service: Endoscopy;  Laterality: N/A;   KNEE SURGERY Right 1997   bursa sac   POLYPECTOMY  09/05/2015   Procedure: POLYPECTOMY;  Surgeon: Margo LITTIE Haddock, MD;  [facility];  Service: Endoscopy;;  colon polyps   SHOULDER ARTHROSCOPY WITH BICEPS TENDON REPAIR Right 2011   TONSILECTOMY, ADENOIDECTOMY, BILATERAL MYRINGOTOMY AND TUBES      FAMILY HISTORY: family history includes Dementia in his mother; Diabetes in his brother; Heart disease in his father and mother; Sleep apnea in his nephew.  SOCIAL HISTORY:  reports that he quit smoking about 6 years ago. His smoking use included cigarettes. He started smoking about 31 years ago. He has a 25 pack-year smoking history. He has been exposed to tobacco smoke. He has never used smokeless tobacco. He reports current alcohol use of about 18.0 standard drinks of alcohol per week. He reports that he does not  use drugs.  ALLERGIES: Patient has no known allergies.  MEDICATIONS:  Current Outpatient Medications  Medication Sig Dispense Refill   amLODipine  (NORVASC ) 10 MG tablet Take 1 tablet (10 mg total) by mouth daily. **Dose change 90 tablet 3   Ascorbic Acid (VITAMIN C) 1000 MG tablet Take 1,000 mg by mouth daily.     aspirin EC 81 MG  tablet Take 81 mg by mouth daily.     B Complex Vitamins (B COMPLEX PO) Take by mouth.     celecoxib  (CELEBREX ) 200 MG capsule Take 1 capsule (200 mg total) by mouth daily as needed for moderate pain (pain score 4-6) (NO NSAIDS when taking). 90 capsule 3   cholecalciferol (VITAMIN D3) 25 MCG (1000 UT) tablet Take 1,000 Units by mouth daily.     escitalopram  (LEXAPRO ) 10 MG tablet Take 1 tablet (10 mg total) by mouth daily. 90 tablet 3   ezetimibe  (ZETIA ) 10 MG tablet Take 1 tablet (10 mg total) by mouth daily. 90 tablet 3   fenofibrate  (TRICOR ) 145 MG tablet Take 1 tablet (145 mg total) by mouth daily. 90 tablet 3   omeprazole  (PRILOSEC) 40 MG capsule Take 1 capsule (40 mg total) by mouth daily. 90 capsule 3   ondansetron  (ZOFRAN ) 4 MG tablet Take 1 tablet (4 mg total) by mouth every 8 (eight) hours as needed. 30 tablet 0   rosuvastatin  (CRESTOR ) 40 MG tablet Take 1 tablet (40 mg total) by mouth daily. 90 tablet 3   VITAMIN E PO Take by mouth daily.     No current facility-administered medications for this visit.    REVIEW OF SYSTEMS:  A 15 point review of systems is documented in the electronic medical record. This was obtained by the nursing staff. However, I reviewed this with the patient to discuss relevant findings and make appropriate changes.  {Ros - complete:30496}.  The patient completed an IPSS and IIEF questionnaire.  His IPSS score was *** indicating *** urinary outflow obstructive symptoms.  He indicated that his erectile function is *** to complete sexual activity ***.   PHYSICAL EXAM: This patient is in no acute distress.  He is alert and oriented.   vitals were not taken for this visit.  He exhibits no respiratory distress or labored breathing.  He appears neurologically intact.  His mood is pleasant.  His affect is appropriate.  Please note the digital rectal exam findings described above.  KPS = ***  100 - Normal; no complaints; no evidence of disease. 90   - Able to carry on  normal activity; minor signs or symptoms of disease. 80   - Normal activity with effort; some signs or symptoms of disease. 73   - Cares for self; unable to carry on normal activity or to do active work. 60   - Requires occasional assistance, but is able to care for most of his personal needs. 50   - Requires considerable assistance and frequent medical care. 40   - Disabled; requires special care and assistance. 30   - Severely disabled; hospital admission is indicated although death not imminent. 20   - Very sick; hospital admission necessary; active supportive treatment necessary. 10   - Moribund; fatal processes progressing rapidly. 0     - Dead  Karnofsky DA, Abelmann WH, Craver LS and [facility] [phone]) The use of the nitrogen mustards in the palliative treatment of carcinoma: with particular reference to bronchogenic carcinoma Cancer 1 634-56   LABORATORY DATA:  Lab Results  Component Value Date   WBC 4.7 01/02/2023   HGB 14.2 01/02/2023   HCT 42.8 01/02/2023   MCV 95 01/02/2023   PLT 235 01/02/2023   Lab Results  Component Value Date   NA 138 01/02/2023   K 4.5 01/02/2023   CL 100 01/02/2023   CO2 22 01/02/2023   Lab Results  Component Value Date   ALT 29 01/02/2023   AST 28 01/02/2023   ALKPHOS 58 01/02/2023   BILITOT 0.4 01/02/2023     RADIOGRAPHY: NM PET (PSMA) SKULL TO MID THIGH Result Date: 09/07/2023 CLINICAL DATA:  Prostate carcinoma. Intermediate to high risk staging. Extensive prostate carcinoma within the gland . Gleason Gleason 6, 7, and 8 identified on pathology EXAM: NUCLEAR MEDICINE PET SKULL BASE TO THIGH TECHNIQUE: 8.7 mCi Flotufolastat (Posluma ) was injected intravenously. Full-ring PET imaging was performed from the skull base to thigh after the radiotracer. CT data was obtained and used for attenuation correction and anatomic localization. COMPARISON:  PSMA PET scan 06/25/1998 FINDINGS: NECK No radiotracer activity in neck lymph nodes. Incidental CT  finding: None. CHEST No radiotracer accumulation within mediastinal or hilar lymph nodes. No suspicious pulmonary nodules on the CT scan. Incidental CT finding: None. ABDOMEN/PELVIS Prostate: Focus of increased radiotracer activity noted the posterolateral RIGHT lobe of the prostate gland SUV max 4.7. Mild activity base of the gland midline on image 195. No evidence of abnormal activity in the seminal vesicles. Lymph nodes: No abnormal radiotracer accumulation within pelvic or abdominal nodes. Liver: No evidence of liver metastasis. Incidental CT finding: None. SKELETON No focal activity to suggest skeletal metastasis. Anterior cervical fusion IMPRESSION: 1. Focal activity in the RIGHT lobe of the prostate gland consistent with primary prostate adenocarcinoma. 2. No evidence of metastatic adenopathy in the pelvis or periaortic retroperitoneum. 3. No evidence of visceral metastasis or skeletal metastasis. Electronically Signed   By: Jackquline Boxer M.D.   On: 09/07/2023 13:02      IMPRESSION: This gentleman is a ***.  His T-Stage, Gleason's Score, and PSA put him into the *** risk group.  Accordingly he is eligible for a variety of potential treatment options including ***.  PLAN:***  I spent {CHL ONC TIME VISIT - DTPQU:[id number]} face to face with the patient and more than 50% of that time was spent in counseling and/or coordination of care.   ------------------------------------------------  Donnice LABOR. Patrcia, M.D.

## 2023-09-25 NOTE — Telephone Encounter (Signed)
 completed

## 2023-09-25 NOTE — Telephone Encounter (Signed)
 Pt dropped off renewal FMLA forms to be signed. They are in your box, please return when done.

## 2023-09-25 NOTE — Progress Notes (Signed)
 RN attempted to contact patient to review upcoming [facility] appointment, voicemail box has not been set up at this time.

## 2023-09-26 ENCOUNTER — Ambulatory Visit
Admission: RE | Admit: 2023-09-26 | Discharge: 2023-09-26 | Disposition: A | Discharge status: Home / Self Care | Source: Ambulatory Visit | Attending: Radiation Oncology | Admitting: Radiation Oncology

## 2023-09-26 ENCOUNTER — Encounter: Payer: Self-pay | Admitting: Radiation Oncology

## 2023-09-26 VITALS — BP 155/89 | HR 89 | Temp 97.9°F | Resp 18 | Ht 71.0 in | Wt 199.6 lb

## 2023-09-26 DIAGNOSIS — Z0279 Encounter for issue of other medical certificate: Secondary | ICD-10-CM

## 2023-09-26 DIAGNOSIS — C61 Malignant neoplasm of prostate: Secondary | ICD-10-CM

## 2023-09-26 HISTORY — DX: Acute pancreatitis without necrosis or infection, unspecified: K85.90

## 2023-09-26 HISTORY — DX: Elevated prostate specific antigen (PSA): R97.20

## 2023-09-26 HISTORY — DX: Diverticulitis of intestine, part unspecified, without perforation or abscess without bleeding: K57.92

## 2023-09-26 NOTE — Progress Notes (Signed)
 Radiation Oncology         (336) [phone] ________________________________  Multidisciplinary Prostate Cancer Clinic  Initial Radiation Oncology Consultation  Name: Seth Mcmillan MRN: [id number]  Date: 09/26/2023  DOB: 1965-03-27  RR:[name], Seth HERO, DO  Renda Glance, MD   REFERRING PHYSICIAN: Renda Glance, MD  DIAGNOSIS: 58 y.o. gentleman with stage T2a adenocarcinoma of the prostate with a Gleason's score of 4+4 and a PSA of 5.1    ICD-10-CM   1. Prostate cancer (HCC)  C61       HISTORY OF PRESENT ILLNESS::Seth Mcmillan is a 58 y.o. gentleman. He was initially diagnosed with low volume Gleason 3+3 prostate cancer back in 08/2014 by Dr. Beola in [place], [place]. PSA at time of diagnosis was 4.8. He was placed on active surveillance at that time. He initially continued to follow with urology, but he ultimately was lost to follow up after a surveillance prostate biopsy was recommended in 2019. More recently, he was noted to have an elevated PSA of 5.1 by his primary care physician, Dr. Jolinda.  Accordingly, he was referred for evaluation in urology by Dr. Renda on 05/13/23,  digital rectal examination performed at that time showed a confined 1 cm nodule toward right base of the prostate. He underwent a prostate MRI on 06/25/23 showing: PI-RADS 5 lesion of right posterolateral peripheral zone in mid gland and apex; PI-RADS 4 lesion of left posterolateral peripheral zone in mid gland and apex. The patient proceeded to MRI fusion biopsy of the prostate on 07/30/23.  The prostate volume measured 37.5 cc.  Out of 20 core biopsies, 19 were positive.  The maximum Gleason score was 4+4, and this was seen in one sample from the left MRI ROI, left mid lateral, and left base. Additionally, Gleason 4+3 was seen in one sample from the right MRI ROI (with perineural invasion), the remaining three samples from the left MRI ROI (two with PNI), left base lateral, and right mid (with PNI), Gleason 3+4 in right apex  lateral, right mid lateral (with PNI), left mid, and the remaining three samples from the right MRI ROI (all with PNI), and Gleason 3+3 in right base, left apex lateral, right apex (microscopic focus), and right base lateral.  He underwent a staging PSMA PET scan on 08/29/23 showing no evidence of disease outside of the prostate.  The patient reviewed the biopsy results with his urologist and he has kindly been referred today to the multidisciplinary prostate cancer clinic for presentation of pathology and radiology studies in our conference for discussion of potential radiation treatment options and clinical evaluation.  PREVIOUS RADIATION THERAPY: No  PAST MEDICAL HISTORY:  has a past medical history of Anxiety, Arthritis, Depression, Elevated PSA, Fatty liver, GERD (gastroesophageal reflux disease), Hyperlipidemia, Hypertension, and IBS (irritable bowel syndrome).    PAST SURGICAL HISTORY: Past Surgical History:  Procedure Laterality Date   ANTERIOR CERVICAL DECOMP/DISCECTOMY FUSION N/A 05/18/2018   Procedure: C6-7. C7-T1 ANTERIOR CERVICAL DECOMPRESSION/DISCECTOMY FUSION, ALLOGRAFT, PLATE;  Surgeon: Barbarann Oneil BROCKS, MD;  [facility];  Service: Orthopedics;  Laterality: N/A;   BIOPSY  09/05/2015   Procedure: BIOPSY;  Surgeon: Margo LITTIE Haddock, MD;  [facility];  Service: Endoscopy;;  random colon bx   BIOPSY  05/25/2019   Procedure: BIOPSY;  Surgeon: Haddock Margo LITTIE, MD;  [facility];  Service: Endoscopy;;   COLONOSCOPY N/A 09/05/2015   Dr. haddock: 4 simple adenomas removed.  Next colonoscopy 3 years.   COLONOSCOPY WITH  PROPOFOL  N/A 05/25/2019   Procedure: COLONOSCOPY WITH PROPOFOL ;  Surgeon: Harvey Margo CROME, MD;  [facility];  Service: Endoscopy;  Laterality: N/A;  12:15pm   ESOPHAGOGASTRODUODENOSCOPY (EGD) WITH PROPOFOL  N/A 05/25/2019   Procedure: ESOPHAGOGASTRODUODENOSCOPY (EGD) WITH PROPOFOL ;  Surgeon: Harvey Margo CROME, MD;  [facility];   Service: Endoscopy;  Laterality: N/A;   KNEE SURGERY Right 1997   bursa sac   POLYPECTOMY  09/05/2015   Procedure: POLYPECTOMY;  Surgeon: Margo CROME Harvey, MD;  [facility];  Service: Endoscopy;;  colon polyps   PROSTATE BIOPSY     SHOULDER ARTHROSCOPY WITH BICEPS TENDON REPAIR Right 2011   TONSILECTOMY, ADENOIDECTOMY, BILATERAL MYRINGOTOMY AND TUBES      FAMILY HISTORY: family history includes Dementia in his mother; Diabetes in his brother; Heart disease in his father and mother; Sleep apnea in his nephew.  SOCIAL HISTORY:  reports that he quit smoking about 6 years ago. His smoking use included cigarettes. He started smoking about 31 years ago. He has a 25 pack-year smoking history. He has been exposed to tobacco smoke. He has never used smokeless tobacco. He reports current alcohol use of about 18.0 standard drinks of alcohol per week. He reports current drug use. Drug: Marijuana.  ALLERGIES: Patient has no known allergies.  MEDICATIONS:  Current Outpatient Medications  Medication Sig Dispense Refill   amLODipine  (NORVASC ) 10 MG tablet Take 1 tablet (10 mg total) by mouth daily. **Dose change 90 tablet 3   Ascorbic Acid (VITAMIN C) 1000 MG tablet Take 1,000 mg by mouth daily.     aspirin EC 81 MG tablet Take 81 mg by mouth daily.     B Complex Vitamins (B COMPLEX PO) Take by mouth.     celecoxib  (CELEBREX ) 200 MG capsule Take 1 capsule (200 mg total) by mouth daily as needed for moderate pain (pain score 4-6) (NO NSAIDS when taking). 90 capsule 3   cholecalciferol (VITAMIN D3) 25 MCG (1000 UT) tablet Take 1,000 Units by mouth daily.     escitalopram  (LEXAPRO ) 10 MG tablet Take 1 tablet (10 mg total) by mouth daily. 90 tablet 3   ezetimibe  (ZETIA ) 10 MG tablet Take 1 tablet (10 mg total) by mouth daily. 90 tablet 3   fenofibrate  (TRICOR ) 145 MG tablet Take 1 tablet (145 mg total) by mouth daily. 90 tablet 3   omeprazole  (PRILOSEC) 40 MG capsule Take 1 capsule (40 mg total) by  mouth daily. 90 capsule 3   ondansetron  (ZOFRAN ) 4 MG tablet Take 1 tablet (4 mg total) by mouth every 8 (eight) hours as needed. 30 tablet 0   rosuvastatin  (CRESTOR ) 40 MG tablet Take 1 tablet (40 mg total) by mouth daily. 90 tablet 3   triamcinolone  ointment (KENALOG ) 0.5 % APPLY OINTMENT TOPICALLY TWICE DAILY     VITAMIN E PO Take by mouth daily.     No current facility-administered medications for this encounter.      REVIEW OF SYSTEMS:  On review of systems, the patient reports that he is doing well overall. He denies any chest pain, shortness of breath, cough, fevers, chills, night sweats, unintended weight changes. He denies any bowel disturbances, and denies abdominal pain, nausea or vomiting. He denies any new musculoskeletal or joint aches or pains. His IPSS was Total Score: 8, indicating moderate urinary symptoms (Reference 0-7 mild, 8-19 moderate, 20-35 severe).  His SHIM: 22, indicating he has no erectile dysfunction (Reference - 22-25 None, 17-21 Mild, 8-16 Moderate, 1-7  Severe). A complete review of systems is obtained and is otherwise negative.   PHYSICAL EXAM:  Wt Readings from Last 3 Encounters:  09/26/23 199 lb 9.6 oz (90.5 kg)  05/16/23 192 lb (87.1 kg)  04/11/23 195 lb 9.6 oz (88.7 kg)   Temp Readings from Last 3 Encounters:  09/26/23 97.9 F (36.6 C)  05/16/23 98.7 F (37.1 C)  04/11/23 97.9 F (36.6 C)   BP Readings from Last 3 Encounters:  09/26/23 (!) 155/89  05/16/23 (!) 152/83  04/11/23 131/74   Pulse Readings from Last 3 Encounters:  09/26/23 89  04/11/23 100  12/31/22 60    /10  In general this is a well appearing man in no acute distress. He's alert and oriented x4 and appropriate throughout the examination. Cardiopulmonary assessment is negative for acute distress and he exhibits normal effort.    KPS = 100  100 - Normal; no complaints; no evidence of disease. 90   - Able to carry on normal activity; minor signs or symptoms of disease. 80    - Normal activity with effort; some signs or symptoms of disease. 78   - Cares for self; unable to carry on normal activity or to do active work. 60   - Requires occasional assistance, but is able to care for most of his personal needs. 50   - Requires considerable assistance and frequent medical care. 40   - Disabled; requires special care and assistance. 30   - Severely disabled; hospital admission is indicated although death not imminent. 20   - Very sick; hospital admission necessary; active supportive treatment necessary. 10   - Moribund; fatal processes progressing rapidly. 0     - Dead  Karnofsky DA, Abelmann WH, Craver LS and Burchenal [facility] [phone]) The use of the nitrogen mustards in the palliative treatment of carcinoma: with particular reference to bronchogenic carcinoma Cancer 1 634-56   LABORATORY DATA:  Lab Results  Component Value Date   WBC 4.7 01/02/2023   HGB 14.2 01/02/2023   HCT 42.8 01/02/2023   MCV 95 01/02/2023   PLT 235 01/02/2023   Lab Results  Component Value Date   NA 138 01/02/2023   K 4.5 01/02/2023   CL 100 01/02/2023   CO2 22 01/02/2023   Lab Results  Component Value Date   ALT 29 01/02/2023   AST 28 01/02/2023   ALKPHOS 58 01/02/2023   BILITOT 0.4 01/02/2023     RADIOGRAPHY: NM PET (PSMA) SKULL TO MID THIGH Result Date: 09/07/2023 CLINICAL DATA:  Prostate carcinoma. Intermediate to high risk staging. Extensive prostate carcinoma within the gland . Gleason Gleason 6, 7, and 8 identified on pathology EXAM: NUCLEAR MEDICINE PET SKULL BASE TO THIGH TECHNIQUE: 8.7 mCi Flotufolastat (Posluma ) was injected intravenously. Full-ring PET imaging was performed from the skull base to thigh after the radiotracer. CT data was obtained and used for attenuation correction and anatomic localization. COMPARISON:  PSMA PET scan 06/25/1998 FINDINGS: NECK No radiotracer activity in neck lymph nodes. Incidental CT finding: None. CHEST No radiotracer accumulation within  mediastinal or hilar lymph nodes. No suspicious pulmonary nodules on the CT scan. Incidental CT finding: None. ABDOMEN/PELVIS Prostate: Focus of increased radiotracer activity noted the posterolateral RIGHT lobe of the prostate gland SUV max 4.7. Mild activity base of the gland midline on image 195. No evidence of abnormal activity in the seminal vesicles. Lymph nodes: No abnormal radiotracer accumulation within pelvic or abdominal nodes. Liver: No evidence of liver metastasis. Incidental CT finding:  None. SKELETON No focal activity to suggest skeletal metastasis. Anterior cervical fusion IMPRESSION: 1. Focal activity in the RIGHT lobe of the prostate gland consistent with primary prostate adenocarcinoma. 2. No evidence of metastatic adenopathy in the pelvis or periaortic retroperitoneum. 3. No evidence of visceral metastasis or skeletal metastasis. Electronically Signed   By: Jackquline Boxer M.D.   On: 09/07/2023 13:02      IMPRESSION/PLAN: 58 y.o. gentleman with Stage T2a adenocarcinoma of the prostate with a Gleason score of 4+4 and a PSA of 5.1.    We discussed the patient's workup and outlined the nature of prostate cancer in this setting. The patient's T stage, Gleason's score, and PSA put him into the high risk group. Accordingly, he is eligible for a variety of potential treatment options including LT-ADT concurrent with 8 weeks of external radiation, 5 weeks of external radiation with an upfront brachytherapy boost, or prostatectomy. We discussed the available radiation techniques, and focused on the details and logistics of delivery. We discussed and outlined the risks, benefits, short and long-term effects associated with radiotherapy and compared and contrasted these with prostatectomy. We discussed the role of SpaceOAR gel in reducing the rectal toxicity associated with radiotherapy. We also detailed the role of ADT in the treatment of high risk prostate cancer and outlined the associated side  effects that could be expected with this therapy. He appears to have a good understanding of his disease and our treatment recommendations which are of curative intent.  He was encouraged to ask questions that were answered to his/their stated satisfaction.  At the end of the conversation the patient is interested in moving forward with prostatectomy.  We discussed some of the potential proven indications for postoperative radiotherapy including positive margins, extracapsular extension, and seminal vesicle involvement. We also talked about some of the other potential findings leading to a recommendation for radiotherapy including a non-zero postoperative PSA and positive lymph nodes.   We personally spent 60 minutes in this encounter including chart review, reviewing radiological studies, meeting face-to-face with the patient, entering orders and completing documentation.   ------------------------------------------------   Donnice Barge, MD [facility]  Radiation Oncology Direct Dial: [phone]  Fax: [phone] [email or web address]  Skype  [email or web address]   This document serves as a record of services personally performed by Donnice Barge, MD. It was created on his behalf by Izetta Neither, a trained medical scribe. The creation of this record is based on the scribe's personal observations and the provider's statements to them. This document has been checked and approved by the attending provider.

## 2023-09-26 NOTE — Progress Notes (Signed)
                               Care Plan Summary  Name: Seth Mcmillan DOB: 09-27-1965   Your Medical Team:   Urologist -  Dr. Gretel Ferrara, [facility]  Radiation Oncologist - Dr. Donnice Barge, [facility]     Recommendations: Surgery   * These recommendations are based on information available as of today's consult.      Recommendations may change depending on the results of further tests or exams.    Next Steps: 1) [facility] will contact you to schedule pre-op physical therapy, and surgery date.     When appointments need to be scheduled, you will be contacted by [facility] and/or [facility].  Questions?  Please do not hesitate to call Vertell Pont, BSN, RN at [phone] with any questions or concerns.  Vertell is your Oncology Nurse Navigator and is available to assist you while you're receiving your medical care at [facility].

## 2023-09-26 NOTE — Consult Note (Signed)
 [facility]     09/26/2023   --------------------------------------------------------------------------------   Seth Mcmillan  MRN: [id number]  DOB: 10-12-1965, 58 year old Male  SSN: [id number]   PRIMARY CARE:  Seth L. Beola, MD  PRIMARY CARE FAX:  [phone]  REFERRING:  Seth Mcmillan  PROVIDER:  Gretel Mcmillan, M.D.  LOCATION:  [facility] [phone]     --------------------------------------------------------------------------------   CC/HPI: CC: Prostate Cancer   PCP: Dr. Norene Mcmillan   Seth Mcmillan is a 58 year old gentleman who was initially diagnosed with very low risk prostate cancer in 2016. He had been recommended undergo repeat biopsy in 2019 but ultimately did not proceed and did not follow-up with urology. He presented to me in February of this year and was noted to have a nodule toward the right base of the prostate. His PSA was 5.1 when checked by his primary care physician in January. After discussion, he was agreeable to undergo further evaluation. He had an MRI of the prostate on 06/25/2023 which indicated a 2 cm PI-RADS 5 lesion of the right mid peripheral zone and another 1.4 cm PI-RADS 4 lesion of the left mid/apex peripheral zone. He proceeded with an MR/ultrasound fusion biopsy in 07/30/2023. 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   Family history: He has a strong family history of prostate cancer with his father and grandfather.   Imaging studies:  MRI (06/25/2023): Although he has no definite extraprostatic extension, he does have a large area of capsular contact on the right side consistent with his large MRI finding and palpable nodule. No SVI, LAD, or bone lesions noted.  PSMA PET scan (08/29/23): Negative for metastatic disease.   PMH: He has a history of anxiety, depression,  gastroesophageal reflux disease, hypertension, hyperlipidemia, and irritable bowel syndrome. He does drink about a case of beer per week.  PSH: No abdominal surgeries. He does have a history of prior diverticulitis and pancreatitis.   TNM stage: cT2a N0 Mx  PSA: 5.1  Gleason score: 4+4 = 8 (grade group 4)  Biopsy (07/30/2023): 19/20 cores positive  Left: Left lateral apex (30%, 3+3 = 6), left lateral mid (20%, 4+4 = 8), left mid (70%, 3+4 = 7), left lateral base (20%, 4+3 = 7), left base (20%, 4+4 = 8)  Right: Right apex (1%, 3+3 = 6), right lateral apex (70%, 3+4 = 7), right mid (60%, 4+3 = 7), right lateral mid (70%, 3+4 = 7), right base (5%, 3+3 = 6), right lateral base 20%, 3+3 = 6)  ROI 1: 4/4 cores positive, Gleason 3+4 = 7 in 70%, 60%, and 90% and Gleason 4+3 = 7 and 95%. Perineural invasion is noted in all cores.  ROI 2: 4/4 cores positive, Gleason 4+3 = 7 in 60%, 70%, and 60% and Gleason 4+4 = 8 and 80%. Perineural invasion noted in 1 core.  Prostate volume: 37.5 cc   Urinary function: IPSS is 8.  Erectile function: SHIM score is 18. His ability to obtain and maintain an erection is relatively poor. This is slightly improved with oral medication.     ALLERGIES: No Known Drug Allergies    MEDICATIONS: diazePAM 10 MG Tablet Take 10 mg 30-60 minutes prior to your procedure  levoFLOXacin  750 MG Tablet Please take one tablet the morning of your biopsy.  Omeprazole  40 MG Capsule Delayed Release  amLODIPine  Besylate 10 MG Tablet  B Complex  Escitalopram  Oxalate 10 MG Tablet  Fenofibrate  145 MG Tablet  Rosuvastatin  Calcium  40 MG Tablet  Vitamin C  Vitamin D2     GU PSH: Prostate Needle Biopsy - 07/30/2023     NON-GU PSH: Knee Arthroscopy Shoulder Surgery (Unspecified) Surgical Pathology, Gross And Microscopic Examination For Prostate Needle - 07/30/2023 Tonsillectomy Visit Complexity (formerly GPC1X) - 08/14/2023, 05/13/2023     GU PMH: Prostate Cancer - 08/14/2023, -  07/30/2023, - 05/13/2023, - 2019, - 2018      PMH Notes: prostate cancer   NON-GU PMH: Anxiety Arthritis GERD Hypercholesterolemia Hypertension Irritable bowel syndrome with diarrhea    FAMILY HISTORY: Diabetes - Brother Prostate Cancer - Grandfather   SOCIAL HISTORY: Marital Status: Divorced Current Smoking Status: Patient smokes. Has smoked since 07/17/1986. Smokes 1/2 pack per day.   Tobacco Use Assessment Completed: Used Tobacco in last 30 days? Drinks 2 drinks per day.  Drinks 1 caffeinated drink per day. Patient's occupation Chartered loss adjuster.     Notes: 2 sons 1 daughter   REVIEW OF SYSTEMS:    GU Review Male:   Patient denies frequent urination, hard to postpone urination, burning/ pain with urination, get up at night to urinate, leakage of urine, stream starts and stops, trouble starting your streams, and have to strain to urinate .  Gastrointestinal (Upper):   Patient denies nausea and vomiting.  Gastrointestinal (Lower):   Patient denies diarrhea and constipation.  Constitutional:   Patient denies fever, night sweats, weight loss, and fatigue.  Skin:   Patient denies skin rash/ lesion and itching.  Eyes:   Patient denies blurred vision and double vision.  Ears/ Nose/ Throat:   Patient denies sore throat and sinus problems.  Hematologic/Lymphatic:   Patient denies swollen glands and easy bruising.  Cardiovascular:   Patient denies leg swelling and chest pains.  Respiratory:   Patient denies cough and shortness of breath.  Endocrine:   Patient denies excessive thirst.  Musculoskeletal:   Patient denies back pain and joint pain.  Neurological:   Patient denies headaches and dizziness.  Psychologic:   Patient denies depression and anxiety.   VITAL SIGNS: None   MULTI-SYSTEM PHYSICAL EXAMINATION:    Constitutional: Well-nourished. No physical deformities. Normally developed. Good grooming.  Respiratory: No labored breathing, no use of accessory muscles.    Cardiovascular: Normal temperature, normal extremity pulses, no swelling, no varicosities.  Gastrointestinal: No mass, no tenderness, no rigidity, non obese abdomen.     Complexity of Data:  Lab Test Review:   PSA  Records Review:   Pathology Reports, Previous Patient Records  X-Ray Review: PET- PSMA Scan: Reviewed Films.  MRI Prostate GSORAD: Reviewed Films.     04/21/17 08/14/16  PSA  Total PSA 4.1 ng/dl 2.3 ng/dl   Notes:                     CLINICAL DATA: Prostate carcinoma. Intermediate to high risk  staging. Extensive prostate carcinoma within the gland . Gleason  Gleason 6, 7, and 8 identified on pathology   EXAM:  NUCLEAR MEDICINE PET SKULL BASE TO THIGH   TECHNIQUE:  8.7 mCi Flotufolastat (Posluma ) was injected intravenously.  Full-ring PET imaging was performed from the skull base to thigh  after the radiotracer. CT data was obtained and used for attenuation  correction and anatomic localization.   COMPARISON: PSMA PET scan 06/25/1998   FINDINGS:  NECK   No radiotracer activity in neck  lymph nodes.   Incidental CT finding: None.   CHEST   No radiotracer accumulation within mediastinal or hilar lymph nodes.  No suspicious pulmonary nodules on the CT scan.   Incidental CT finding: None.   ABDOMEN/PELVIS   Prostate: Focus of increased radiotracer activity noted the  posterolateral RIGHT lobe of the prostate gland SUV max 4.7. Mild  activity base of the gland midline on image 195. No evidence of  abnormal activity in the seminal vesicles.   Lymph nodes: No abnormal radiotracer accumulation within pelvic or  abdominal nodes.   Liver: No evidence of liver metastasis.   Incidental CT finding: None.   SKELETON   No focal activity to suggest skeletal metastasis. Anterior cervical  fusion   IMPRESSION:  1. Focal activity in the RIGHT lobe of the prostate gland consistent  with primary prostate adenocarcinoma.  2. No evidence of metastatic adenopathy  in the pelvis or periaortic  retroperitoneum.  3. No evidence of visceral metastasis or skeletal metastasis.    Electronically Signed  By: Jackquline Boxer M.D.  On: 09/07/2023 13:02   CLINICAL DATA: Elevated PSA level. R97.20   EXAM:  MR PROSTATE WITHOUT AND WITH CONTRAST   TECHNIQUE:  Multiplanar multisequence MRI images were obtained of the pelvis  centered about the prostate. Pre and post contrast images were  obtained.   CONTRAST: 9 cc Vueway    COMPARISON: CT pelvis 10/30/2018 CT pelvis 10/30/2018   FINDINGS:  Prostate:   Region of interest # 1: PI-RADS category 5 lesion of the right  posterolateral peripheral zone in the mid gland and apex with  focally reduced T2 signal (image 47 series 6) corresponding with  reduced ADC map activity and restricted diffusion (image 16 of  series 8 and 9) as well as focal early enhancement (image 145 series  12). This measures 0.94 cc (2.0 by 0.7 by 1.3 cm).   Region of interest # 2: PI-RADS category 4 lesion of the left  posterolateral peripheral zone in mid gland and apex with focally  reduced T2 signal (image 51 series 6 close) corresponding to reduced  ADC map activity and restricted diffusion (image 17 of series 08/09)  and mild focal early enhancement (image 169 of series 12). This  measures 0.62 cc (1.4 by 1.1 by 0.8 cm).   Volume: 3D volumetric analysis: Prostate volume 38.7 cc (5.2 by 3.9  by 3.8 cm).   Transcapsular spread: Not directly visualized although region of  interest # 1 has greater than 1.5 cm of capsular contact which can  increase risk of occult transcapsular spread.   Seminal vesicle involvement: Absent   Neurovascular bundle involvement: Absent   Pelvic adenopathy: Absent   Bone metastasis: Absent   Other findings: Mild sigmoid colon diverticulosis.   IMPRESSION:  1. PI-RADS category 5 lesion of the right posterolateral peripheral  zone in the mid gland and apex.  2. PI-RADS category 4 lesion of  the left posterolateral peripheral  zone in the mid gland and apex.  3. Targeting data sent to [facility].  4. Mild sigmoid colon diverticulosis.    Electronically Signed  By: Ryan Salvage M.D.  On: 07/01/2023 11:22   PROCEDURES:          Visit Complexity - G2211    ASSESSMENT:      ICD-10 Details  1 GU:   Prostate Cancer - C61    PLAN:           Document Letter(s):  Created for Patient: Clinical Summary  Notes:   1. High risk prostate cancer: I had a detailed discussion with Mr. Ehresman today regarding his prostate cancer situation. We reviewed his PSMA PET scan which fortunately was negative for metastatic disease. He has seen Dr. Patrcia already and feels fairly adamant that he wishes to proceed with surgical therapy rather than long-term androgen deprivation and radiation therapy.   The patient was counseled about the natural history of prostate cancer and the standard treatment options that are available for prostate cancer. It was explained to him how his age and life expectancy, clinical stage, Gleason score/prognostic grade group, and PSA (and PSA density) affect his prognosis, the decision to proceed with additional staging studies, as well as how that information influences recommended treatment strategies. We discussed the roles for active surveillance, radiation therapy, surgical therapy, androgen deprivation, as well as ablative therapy and other investigational options for the treatment of prostate cancer as appropriate to his individual cancer situation. We discussed the risks and benefits of these options with regard to their impact on cancer control and also in terms of potential adverse events, complications, and impact on quality of life particularly related to urinary and sexual function. The patient was encouraged to ask questions throughout the discussion today and all questions were answered to his stated satisfaction. In addition, the patient was provided with  and/or directed to appropriate resources and literature for further education about prostate cancer and treatment options. We discussed surgical therapy for prostate cancer including the different available surgical approaches. We discussed, in detail, the risks and expectations of surgery with regard to cancer control, urinary control, and erectile function as well as the expected postoperative recovery process. Additional risks of surgery including but not limited to bleeding, infection, hernia formation, nerve damage, lymphocele formation, bowel/rectal injury potentially necessitating colostomy, damage to the urinary tract resulting in urine leakage, urethral stricture, and the cardiopulmonary risks such as myocardial infarction, stroke, death, venothromboembolism, etc. were explained. The risk of open surgical conversion for robotic/laparoscopic prostatectomy was also discussed.   At this time, he does confirm his decision that he wishes to proceed with surgical treatment. He will be scheduled for a unilateral left nerve sparing robot-assisted laparoscopic radical prostatectomy and bilateral pelvic lymphadenectomy. I did discuss the benefits of him reducing his alcohol intake perioperatively and to assist with his recovery of his urinary continence.   CC: Dr. Norene Gums  Dr. Donnice Patrcia    E & M CODES: We spent 44 minutes dedicated to evaluation and management time, including face to face interaction, discussions on coordination of care, documentation, result review, and discussion with others as applicable.

## 2023-09-26 NOTE — Addendum Note (Signed)
 Encounter addended by: Renda Glance, MD on: 09/26/2023 12:21 PM  Actions taken: Clinical Note Signed

## 2023-10-03 NOTE — Progress Notes (Signed)
 RN left message for follow up after recent PMDC.   Pending surgery date at this time.  Will continue to follow.

## 2023-10-07 ENCOUNTER — Other Ambulatory Visit: Payer: Self-pay | Admitting: Urology

## 2023-10-10 NOTE — Progress Notes (Signed)
 Patient is scheduled for pre-op PT at [facility] on 7/31 and will proceed with surgery on 9/4.   RN left message for call back to assess any needs or questions.

## 2023-10-14 ENCOUNTER — Ambulatory Visit (INDEPENDENT_AMBULATORY_CARE_PROVIDER_SITE_OTHER)

## 2023-10-14 ENCOUNTER — Ambulatory Visit: Admitting: Family Medicine

## 2023-10-14 ENCOUNTER — Encounter: Payer: Self-pay | Admitting: Family Medicine

## 2023-10-14 ENCOUNTER — Ambulatory Visit: Payer: Self-pay

## 2023-10-14 ENCOUNTER — Telehealth: Payer: Self-pay | Admitting: Family Medicine

## 2023-10-14 VITALS — BP 157/87 | HR 66 | Temp 98.3°F | Ht 71.0 in | Wt 192.4 lb

## 2023-10-14 DIAGNOSIS — M7989 Other specified soft tissue disorders: Secondary | ICD-10-CM | POA: Diagnosis not present

## 2023-10-14 DIAGNOSIS — L03115 Cellulitis of right lower limb: Secondary | ICD-10-CM

## 2023-10-14 DIAGNOSIS — R0602 Shortness of breath: Secondary | ICD-10-CM | POA: Diagnosis not present

## 2023-10-14 DIAGNOSIS — L03116 Cellulitis of left lower limb: Secondary | ICD-10-CM | POA: Diagnosis not present

## 2023-10-14 MED ORDER — DOXYCYCLINE HYCLATE 100 MG PO TABS: 100.0000 mg | ORAL_TABLET | Freq: Two times a day (BID) | ORAL | 0 refills | Status: DC

## 2023-10-14 MED ORDER — FUROSEMIDE 20 MG PO TABS: 20.0000 mg | ORAL_TABLET | Freq: Every day | ORAL | 0 refills | Status: DC

## 2023-10-14 NOTE — Telephone Encounter (Signed)
 Appt scheduled for 8/11 at 11:30. Pt made aware.

## 2023-10-14 NOTE — Telephone Encounter (Signed)
 Pt saw rakes today and she wants him to come back  1-2 weeks for swelling/cellulitis . He is gottschalk pt and no appts are available

## 2023-10-14 NOTE — Telephone Encounter (Signed)
 FYI Only or Action Required?: FYI only for provider.  Patient was last seen in primary care on 04/11/2023 by [name], DO.  Called Nurse Triage reporting Leg Swelling.  Symptoms began about a month ago.  Interventions attempted: Rest, hydration, or home remedies.  Symptoms are: gradually worsening.  Triage Disposition: See HCP Within 4 Hours (Or PCP Triage)  Patient/caregiver understands and will follow disposition?: Yes            Copied from CRM [id number]. Topic: Clinical - Red Word Triage >> Oct 14, 2023  8:42 AM Myrick T wrote: Red Word that prompted transfer to Nurse Triage: patient stated he has swelling in both feet. His right one is more swollen than the left. Patient stated the swelling be going on for about a week. He has open wounds on both feet that gets bigger with the swelling. Reason for Disposition  [1] Thigh, calf, or ankle swelling AND [2] bilateral AND [3] 1 side is more swollen  Answer Assessment - Initial Assessment Questions 1. ONSET: When did the swelling start? (e.g., minutes, hours, days)     X 1 month 2. LOCATION: What part of the leg is swollen?  Are both legs swollen or just one leg?     Bilateral lower legs to feet  4. REDNESS: Is there redness or signs of infection?     Yes 5. PAIN: Is the swelling painful to touch? If Yes, ask: How painful is it?   (Scale 1-10; mild, moderate or severe)     5/10 6. FEVER: Do you have a fever? If Yes, ask: What is it, how was it measured, and when did it start?      None 7. CAUSE: What do you think is causing the leg swelling?     Unknown 8. MEDICAL HISTORY: Do you have a history of blood clots (e.g., DVT), cancer, heart failure, kidney disease, or liver failure?     HTN 9. RECURRENT SYMPTOM: Have you had leg swelling before? If Yes, ask: When was the last time? What happened that time?     No 10. OTHER SYMPTOMS: Do you have any other symptoms? (e.g., chest pain,  difficulty breathing)       Weeping, itching, right side larger  Protocols used: Leg Swelling and Edema-A-AH

## 2023-10-14 NOTE — Telephone Encounter (Signed)
 Has an app today

## 2023-10-14 NOTE — Telephone Encounter (Signed)
 E2C2 scheduled appointment.

## 2023-10-14 NOTE — Progress Notes (Signed)
 Subjective:  Patient ID: Seth Mcmillan, male    DOB: 1965/09/13, 58 y.o.   MRN: [id number]  Patient Care Team: [name], DO as PCP - General (Family Medicine) Vertell Pont, RN as Oncology Nurse Navigator   Chief Complaint:  Leg Swelling (Bilateral x 1 month but has gotten worse the last few days )   HPI: Seth Mcmillan is a 58 y.o. male presenting on 10/14/2023 for Leg Swelling (Bilateral x 1 month but has gotten worse the last few days )   Seth Mcmillan is a 58 year old male with prostate cancer who presents with bilateral leg swelling and sores.  He has been experiencing bilateral leg and foot swelling for the past one to two months, which has progressively worsened. The swelling is constant, causing pain and tightness, and makes it difficult for him to wear boots. He attributes the swelling to his physically demanding job and frequent travel. His skin feels sore and tight due to the swelling.  He developed sores on his legs about one to two weeks ago, which he believes are due to scratching at night. The sores are significantly draining and affect both feet.  He has a history of prostate cancer diagnosed nine years ago and is scheduled for prostate removal surgery. A biopsy and MRI earlier this year revealed more cancer than initially found, according to his urologist, but he was told it had not spread. He experiences increased urgency when urinating but has not had swelling related to his prostate cancer in the past.  No shortness of breath or chest pain, although he notes mild shortness of breath with activity, which he attributes to his age. He does not require more than two pillows at night to sleep comfortably.          Relevant past medical, surgical, family, and social history reviewed and updated as indicated.  Allergies and medications reviewed and updated. Data reviewed: Chart in Epic.   Past Medical History:  Diagnosis Date   Anxiety    Arthritis     Depression    Diverticulitis    Elevated PSA    Fatty liver    GERD (gastroesophageal reflux disease)    Hyperlipidemia    Hypertension    IBS (irritable bowel syndrome)    Diarrhea type   Pancreatitis     Past Surgical History:  Procedure Laterality Date   ANTERIOR CERVICAL DECOMP/DISCECTOMY FUSION N/A 05/18/2018   Procedure: C6-7. C7-T1 ANTERIOR CERVICAL DECOMPRESSION/DISCECTOMY FUSION, ALLOGRAFT, PLATE;  Surgeon: Barbarann Oneil BROCKS, MD;  Location: [facility] OR;  Service: Orthopedics;  Laterality: N/A;   BIOPSY  09/05/2015   Procedure: BIOPSY;  Surgeon: Margo LITTIE Haddock, MD;  Location: [facility];  Service: Endoscopy;;  random colon bx   BIOPSY  05/25/2019   Procedure: BIOPSY;  Surgeon: Haddock Margo LITTIE, MD;  Location: [facility];  Service: Endoscopy;;   COLONOSCOPY N/A 09/05/2015   Dr. haddock: 4 simple adenomas removed.  Next colonoscopy 3 years.   COLONOSCOPY WITH PROPOFOL  N/A 05/25/2019   Procedure: COLONOSCOPY WITH PROPOFOL ;  Surgeon: Haddock Margo LITTIE, MD;  Location: [facility];  Service: Endoscopy;  Laterality: N/A;  12:15pm   ESOPHAGOGASTRODUODENOSCOPY (EGD) WITH PROPOFOL  N/A 05/25/2019   Procedure: ESOPHAGOGASTRODUODENOSCOPY (EGD) WITH PROPOFOL ;  Surgeon: Haddock Margo LITTIE, MD;  Location: [facility];  Service: Endoscopy;  Laterality: N/A;   KNEE SURGERY Right 1997   bursa sac   POLYPECTOMY  09/05/2015   Procedure: POLYPECTOMY;  Surgeon: Margo LITTIE Haddock, MD;  Location: [facility];  Service: Endoscopy;;  colon polyps   PROSTATE BIOPSY     SHOULDER ARTHROSCOPY WITH BICEPS TENDON REPAIR Right 2011   TONSILECTOMY, ADENOIDECTOMY, BILATERAL MYRINGOTOMY AND TUBES      Social History   Socioeconomic History   Marital status: Divorced    Spouse name: Not on file   Number of children: Not on file   Years of education: Not on file   Highest education level: Not on file  Occupational History   Not on file  Tobacco Use   Smoking status: Former    Current packs/day: 0.00     Average packs/day: 1 pack/day for 25.0 years (25.0 ttl pk-yrs)    Types: Cigarettes    Start date: 07/16/1992    Quit date: 07/16/2017    Years since quitting: 6.2    Passive exposure: Past   Smokeless tobacco: Never   Tobacco comments:    Pt smokes cigar daily   Vaping Use   Vaping status: Never Used  Substance and Sexual Activity   Alcohol use: Yes    Alcohol/week: 18.0 standard drinks of alcohol    Types: 18 Cans of beer per week    Comment: case per week   Drug use: Yes    Types: Marijuana   Sexual activity: Not on file  Other Topics Concern   Not on file  Social History Narrative   Works at a cigarette factory   Social Drivers of Corporate investment banker Strain: Not on file  Food Insecurity: No Food Insecurity (09/26/2023)   Hunger Vital Sign    Worried About Running Out of Food in the Last Year: Never true    Ran Out of Food in the Last Year: Never true  Transportation Needs: No Transportation Needs (09/26/2023)   PRAPARE - Administrator, Civil Service (Medical): No    Lack of Transportation (Non-Medical): No  Physical Activity: Not on file  Stress: Not on file  Social Connections: Not on file  Intimate Partner Violence: Not At Risk (09/26/2023)   Humiliation, Afraid, Rape, and Kick questionnaire    Fear of Current or Ex-Partner: No    Emotionally Abused: No    Physically Abused: No    Sexually Abused: No    Outpatient Encounter Medications as of 10/14/2023  Medication Sig   amLODipine  (NORVASC ) 10 MG tablet Take 1 tablet (10 mg total) by mouth daily. **Dose change   Ascorbic Acid (VITAMIN C) 1000 MG tablet Take 1,000 mg by mouth daily.   aspirin EC 81 MG tablet Take 81 mg by mouth daily.   B Complex Vitamins (B COMPLEX PO) Take by mouth.   celecoxib  (CELEBREX ) 200 MG capsule Take 1 capsule (200 mg total) by mouth daily as needed for moderate pain (pain score 4-6) (NO NSAIDS when taking).   cholecalciferol (VITAMIN D3) 25 MCG (1000 UT) tablet Take  1,000 Units by mouth daily.   doxycycline  (VIBRA -TABS) 100 MG tablet Take 1 tablet (100 mg total) by mouth 2 (two) times daily for 10 days. 1 po bid   escitalopram  (LEXAPRO ) 10 MG tablet Take 1 tablet (10 mg total) by mouth daily.   ezetimibe  (ZETIA ) 10 MG tablet Take 1 tablet (10 mg total) by mouth daily.   fenofibrate  (TRICOR ) 145 MG tablet Take 1 tablet (145 mg total) by mouth daily.   furosemide  (LASIX ) 20 MG tablet Take 1 tablet (20 mg total) by mouth daily.  omeprazole  (PRILOSEC) 40 MG capsule Take 1 capsule (40 mg total) by mouth daily.   ondansetron  (ZOFRAN ) 4 MG tablet Take 1 tablet (4 mg total) by mouth every 8 (eight) hours as needed.   pyrithione zinc (HEAD AND SHOULDERS) 1 % shampoo Apply topically daily as needed for itching.   rosuvastatin  (CRESTOR ) 40 MG tablet Take 1 tablet (40 mg total) by mouth daily.   triamcinolone  ointment (KENALOG ) 0.5 % APPLY OINTMENT TOPICALLY TWICE DAILY   VITAMIN E PO Take by mouth daily.   No facility-administered encounter medications on file as of 10/14/2023.    No Known Allergies  Pertinent ROS per HPI, otherwise unremarkable      Objective:  BP (!) 157/87   Pulse 66   Temp 98.3 F (36.8 C)   Ht 5' 11 (1.803 m)   Wt 192 lb 6.4 oz (87.3 kg)   SpO2 95%   BMI 26.83 kg/m    Wt Readings from Last 3 Encounters:  10/14/23 192 lb 6.4 oz (87.3 kg)  09/26/23 199 lb 9.6 oz (90.5 kg)  05/16/23 192 lb (87.1 kg)    Physical Exam Vitals and nursing note reviewed.  Constitutional:      Appearance: Normal appearance.  HENT:     Head: Normocephalic and atraumatic.     Nose: Nose normal.     Mouth/Throat:     Mouth: Mucous membranes are moist.  Eyes:     Pupils: Pupils are equal, round, and reactive to light.  Cardiovascular:     Rate and Rhythm: Normal rate and regular rhythm.     Heart sounds: Normal heart sounds.  Pulmonary:     Effort: Pulmonary effort is normal.     Breath sounds: Normal breath sounds.  Abdominal:      General: Bowel sounds are normal. There is no distension.     Palpations: Abdomen is soft.  Musculoskeletal:     Cervical back: Neck supple.     Right lower leg: 2+ Edema present.     Left lower leg: 2+ Edema present.  Skin:    General: Skin is warm and dry.     Capillary Refill: Capillary refill takes less than 2 seconds.      Neurological:     General: No focal deficit present.     Mental Status: He is alert and oriented to person, place, and time.  Psychiatric:        Mood and Affect: Mood normal.        Behavior: Behavior normal.        Thought Content: Thought content normal.        Judgment: Judgment normal.    Physical Exam   EXTREMITIES: Feet swollen with tight, sore skin and draining sores.        Results for orders placed or performed in visit on 04/11/23  Lipid panel   Collection Time: 04/11/23  9:03 AM  Result Value Ref Range   Cholesterol, Total 259 (H) 100 - 199 mg/dL   Triglycerides 726 (H) 0 - 149 mg/dL   HDL 65 >60 mg/dL   VLDL Cholesterol Cal 49 (H) 5 - 40 mg/dL   LDL Chol Calc (NIH) 854 (H) 0 - 99 mg/dL   Chol/HDL Ratio 4.0 0.0 - 5.0 ratio  Testosterone    Collection Time: 04/11/23  9:03 AM  Result Value Ref Range   Testosterone  284 264 - 916 ng/dL  PSA   Collection Time: 04/11/23  9:03 AM  Result Value Ref Range  Prostate Specific Ag, Serum 5.1 (H) 0.0 - 4.0 ng/mL       Pertinent labs & imaging results that were available during my care of the patient were reviewed by me and considered in my medical decision making.  Assessment & Plan:  Seth Mcmillan was seen today for leg swelling.  Diagnoses and all orders for this visit:  Leg swelling -     CMP14+EGFR -     CBC with Differential/Platelet -     Brain natriuretic peptide -     furosemide  (LASIX ) 20 MG tablet; Take 1 tablet (20 mg total) by mouth daily. -     DG Chest 2 View; Future  Cellulitis of both feet -     CBC with Differential/Platelet -     doxycycline  (VIBRA -TABS) 100 MG tablet; Take  1 tablet (100 mg total) by mouth 2 (two) times daily for 10 days. 1 po bid  Shortness of breath -     DG Chest 2 View; Future       Bilateral lower extremity edema with cellulitis and skin ulceration Chronic bilateral lower extremity edema with recent worsening over the past month or two, associated with skin ulceration and cellulitis with drainage. Differential diagnosis includes possible kidney dysfunction, liver dysfunction, or protein deficiency. No significant shortness of breath or chest pain reported. - Order blood work to check kidney function, liver function, and protein levels - Order BNP to assess fluid status - Prescribe Lasix  to be taken every morning for the next five days - Prescribe doxycycline  twice a day for ten days - Order chest x-ray - Follow up in one to two weeks for reevaluation  Prostate cancer Prostate cancer diagnosed nine years ago with recent increase in PSA levels and additional lesions found on biopsy and MRI. No lymph node involvement noted on recent imaging. Scheduled for prostatectomy.  Urinary urgency Increased urinary urgency, possibly related to prostate cancer progression.          Continue all other maintenance medications.  Follow up plan: Return if symptoms worsen or fail to improve, for 1-2 weeks for swelling/cellulitis .   Continue healthy lifestyle choices, including diet (rich in fruits, vegetables, and lean proteins, and low in salt and simple carbohydrates) and exercise (at least 30 minutes of moderate physical activity daily).  Educational handout given for edema  The above assessment and management plan was discussed with the patient. The patient verbalized understanding of and has agreed to the management plan. Patient is aware to call the clinic if they develop any new symptoms or if symptoms persist or worsen. Patient is aware when to return to the clinic for a follow-up visit. Patient educated on when it is appropriate to go to  the emergency department.   Rosaline Bruns, FNP-C [facility] [phone]

## 2023-10-14 NOTE — Telephone Encounter (Signed)
 Apt today. LS

## 2023-10-15 ENCOUNTER — Other Ambulatory Visit (INDEPENDENT_AMBULATORY_CARE_PROVIDER_SITE_OTHER): Payer: Self-pay | Admitting: Gastroenterology

## 2023-10-15 ENCOUNTER — Ambulatory Visit: Payer: Self-pay | Admitting: Family Medicine

## 2023-10-15 DIAGNOSIS — R110 Nausea: Secondary | ICD-10-CM

## 2023-10-15 LAB — CBC WITH DIFFERENTIAL/PLATELET
Basophils Absolute: 0.1 x10E3/uL (ref 0.0–0.2)
Basos: 1 %
EOS (ABSOLUTE): 0.1 x10E3/uL (ref 0.0–0.4)
Eos: 1 %
Hematocrit: 42.3 % (ref 37.5–51.0)
Hemoglobin: 14 g/dL (ref 13.0–17.7)
Immature Grans (Abs): 0 x10E3/uL (ref 0.0–0.1)
Immature Granulocytes: 0 %
Lymphocytes Absolute: 1.9 x10E3/uL (ref 0.7–3.1)
Lymphs: 19 %
MCH: 31.5 pg (ref 26.6–33.0)
MCHC: 33.1 g/dL (ref 31.5–35.7)
MCV: 95 fL (ref 79–97)
Monocytes Absolute: 0.9 x10E3/uL (ref 0.1–0.9)
Monocytes: 9 %
Neutrophils Absolute: 6.9 x10E3/uL (ref 1.4–7.0)
Neutrophils: 70 %
Platelets: 218 x10E3/uL (ref 150–450)
RBC: 4.45 x10E6/uL (ref 4.14–5.80)
RDW: 13.4 % (ref 11.6–15.4)
WBC: 9.7 x10E3/uL (ref 3.4–10.8)

## 2023-10-15 LAB — BRAIN NATRIURETIC PEPTIDE: BNP: 38.2 pg/mL (ref 0.0–100.0)

## 2023-10-15 LAB — CMP14+EGFR
ALT: 41 IU/L (ref 0–44)
AST: 53 IU/L — ABNORMAL HIGH (ref 0–40)
Albumin: 4.1 g/dL (ref 3.8–4.9)
Alkaline Phosphatase: 86 IU/L (ref 44–121)
BUN/Creatinine Ratio: 15 (ref 10–24)
BUN: 15 mg/dL (ref 8–27)
Bilirubin Total: 0.7 mg/dL (ref 0.0–1.2)
CO2: 19 mmol/L — ABNORMAL LOW (ref 20–29)
Calcium: 8.6 mg/dL (ref 8.6–10.2)
Chloride: 99 mmol/L (ref 96–106)
Creatinine, Ser: 0.97 mg/dL (ref 0.76–1.27)
Globulin, Total: 2.3 g/dL (ref 1.5–4.5)
Glucose: 87 mg/dL (ref 70–99)
Potassium: 3.8 mmol/L (ref 3.5–5.2)
Sodium: 135 mmol/L (ref 134–144)
Total Protein: 6.4 g/dL (ref 6.0–8.5)
eGFR: 89 mL/min/1.73 (ref >59)

## 2023-10-24 NOTE — Progress Notes (Signed)
 Subjective: CC: Cellulitis follow-up PCP: [name], DO YEP:Seth Mcmillan is a 58 y.o. male presenting to clinic today for:  1.  Cellulitis Patient was seen for cellulitis of both feet with associated swelling on 10/14/2023.  He had labs drawn which demonstrated no evidence of heart failure, anemia or renal impairment but he had some elevation in his LFTs.  He reports that after Lasix  swelling did go down but he still gets some dependent edema.  He is going to get some compression hose soon.  He reports his biggest concern today is that he has diffusely itchy skin at the area of swelling and has been scratching so much that he breaks the skin down sometimes.  He is not moisturizing with anything but does sometimes pour rubbing alcohol on it to alleviate the itching.   ROS: Per HPI  No Known Allergies Past Medical History:  Diagnosis Date   Anxiety    Arthritis    Depression    Diverticulitis    Elevated PSA    Fatty liver    GERD (gastroesophageal reflux disease)    Hyperlipidemia    Hypertension    IBS (irritable bowel syndrome)    Diarrhea type   Pancreatitis     Current Outpatient Medications:    amLODipine  (NORVASC ) 10 MG tablet, Take 1 tablet (10 mg total) by mouth daily. **Dose change, Disp: 90 tablet, Rfl: 3   Ascorbic Acid (VITAMIN C) 1000 MG tablet, Take 1,000 mg by mouth daily., Disp: , Rfl:    aspirin EC 81 MG tablet, Take 81 mg by mouth daily., Disp: , Rfl:    B Complex Vitamins (B COMPLEX PO), Take by mouth., Disp: , Rfl:    celecoxib  (CELEBREX ) 200 MG capsule, Take 1 capsule (200 mg total) by mouth daily as needed for moderate pain (pain score 4-6) (NO NSAIDS when taking)., Disp: 90 capsule, Rfl: 3   cholecalciferol (VITAMIN D3) 25 MCG (1000 UT) tablet, Take 1,000 Units by mouth daily., Disp: , Rfl:    escitalopram  (LEXAPRO ) 10 MG tablet, Take 1 tablet (10 mg total) by mouth daily., Disp: 90 tablet, Rfl: 3   ezetimibe  (ZETIA ) 10 MG tablet, Take 1 tablet  (10 mg total) by mouth daily., Disp: 90 tablet, Rfl: 3   fenofibrate  (TRICOR ) 145 MG tablet, Take 1 tablet (145 mg total) by mouth daily., Disp: 90 tablet, Rfl: 3   furosemide  (LASIX ) 20 MG tablet, Take 1 tablet (20 mg total) by mouth daily., Disp: 5 tablet, Rfl: 0   omeprazole  (PRILOSEC) 40 MG capsule, Take 1 capsule (40 mg total) by mouth daily., Disp: 90 capsule, Rfl: 3   ondansetron  (ZOFRAN ) 4 MG tablet, TAKE 1 TABLET BY MOUTH EVERY 8 HOURS AS NEEDED, Disp: 30 tablet, Rfl: 0   pyrithione zinc (HEAD AND SHOULDERS) 1 % shampoo, Apply topically daily as needed for itching., Disp: , Rfl:    rosuvastatin  (CRESTOR ) 40 MG tablet, Take 1 tablet (40 mg total) by mouth daily., Disp: 90 tablet, Rfl: 3   triamcinolone  ointment (KENALOG ) 0.5 %, APPLY OINTMENT TOPICALLY TWICE DAILY, Disp: , Rfl:    VITAMIN E PO, Take by mouth daily., Disp: , Rfl:  Social History   Socioeconomic History   Marital status: Divorced    Spouse name: Not on file   Number of children: Not on file   Years of education: Not on file   Highest education level: Some college, no degree  Occupational History   Not on file  Tobacco  Use   Smoking status: Former    Current packs/day: 0.00    Average packs/day: 1 pack/day for 25.0 years (25.0 ttl pk-yrs)    Types: Cigarettes    Start date: 07/16/1992    Quit date: 07/16/2017    Years since quitting: 6.2    Passive exposure: Past   Smokeless tobacco: Never   Tobacco comments:    Pt smokes cigar daily   Vaping Use   Vaping status: Never Used  Substance and Sexual Activity   Alcohol use: Yes    Alcohol/week: 18.0 standard drinks of alcohol    Types: 18 Cans of beer per week    Comment: case per week   Drug use: Yes    Types: Marijuana   Sexual activity: Not on file  Other Topics Concern   Not on file  Social History Narrative   Works at a cigarette factory   Social Drivers of Corporate investment banker Strain: Low Risk  (10/26/2023)   Overall Financial Resource Strain  (CARDIA)    Difficulty of Paying Living Expenses: Not very hard  Food Insecurity: No Food Insecurity (10/26/2023)   Hunger Vital Sign    Worried About Running Out of Food in the Last Year: Never true    Ran Out of Food in the Last Year: Never true  Transportation Needs: No Transportation Needs (10/26/2023)   PRAPARE - Administrator, Civil Service (Medical): No    Lack of Transportation (Non-Medical): No  Physical Activity: Unknown (10/26/2023)   Exercise Vital Sign    Days of Exercise per Week: Patient declined    Minutes of Exercise per Session: Not on file  Stress: No Stress Concern Present (10/26/2023)   [employer] of Occupational Health - Occupational Stress Questionnaire    Feeling of Stress: Only a little  Social Connections: Moderately Isolated (10/26/2023)   Social Connection and Isolation Panel    Frequency of Communication with Friends and Family: More than three times a week    Frequency of Social Gatherings with Friends and Family: Patient declined    Attends Religious Services: Patient declined    Database administrator or Organizations: No    Attends Engineer, structural: Not on file    Marital Status: Married  Catering manager Violence: Not At Risk (09/26/2023)   Humiliation, Afraid, Rape, and Kick questionnaire    Fear of Current or Ex-Partner: No    Emotionally Abused: No    Physically Abused: No    Sexually Abused: No   Family History  Problem Relation Age of Onset   Heart disease Mother    Dementia Mother    Heart disease Father    Diabetes Brother    Sleep apnea Nephew    Colon cancer Neg Hx     Objective: Office vital signs reviewed. BP (!) 143/80   Pulse 81   Temp 98.1 F (36.7 C)   Ht 5' 11 (1.803 m)   Wt 186 lb 6 oz (84.5 kg)   SpO2 97%   BMI 25.99 kg/m   Physical Examination:  General: Awake, alert, well nourished, No acute distress HEENT: sclera white, MMM Cardio: regular rate and rhythm, S1S2 heard, no murmurs  appreciated Pulm: clear to auscultation bilaterally, no wheezes, rhonchi or rales; normal work of breathing on room air Extremities: Trace to +1 pitting edema to ankles bilaterally.  He has multiple excoriated lesions along the dorsal aspect of bilateral feet  Assessment/ Plan: 58 y.o. male  Cellulitis of both feet  Leg swelling - Plan: furosemide  (LASIX ) 20 MG tablet  Elevated LFTs - Plan: Hepatic function panel  Dermatitis - Plan: triamcinolone  0.025%-Cerave equivalent 1:1 cream mixture  Cellulitis resolved.  However, high risk for developing recurrence given ongoing leg swelling and dermatitis.  I am going to prescribe him Lasix  for as needed use.  Encouraged compression hose and salt restriction.  Recheck LFTs.  Triamcinolone  with CeraVe compounded mixture sent for as needed use as well   Eean Buss CHRISTELLA Fielding, DO [facility] [phone]

## 2023-10-27 ENCOUNTER — Encounter: Payer: Self-pay | Admitting: Family Medicine

## 2023-10-27 ENCOUNTER — Ambulatory Visit: Admitting: Family Medicine

## 2023-10-27 VITALS — BP 143/80 | HR 81 | Temp 98.1°F | Ht 71.0 in | Wt 186.4 lb

## 2023-10-27 DIAGNOSIS — L03116 Cellulitis of left lower limb: Secondary | ICD-10-CM

## 2023-10-27 DIAGNOSIS — L309 Dermatitis, unspecified: Secondary | ICD-10-CM | POA: Diagnosis not present

## 2023-10-27 DIAGNOSIS — M7989 Other specified soft tissue disorders: Secondary | ICD-10-CM | POA: Diagnosis not present

## 2023-10-27 DIAGNOSIS — R7989 Other specified abnormal findings of blood chemistry: Secondary | ICD-10-CM

## 2023-10-27 DIAGNOSIS — L03115 Cellulitis of right lower limb: Secondary | ICD-10-CM | POA: Diagnosis not present

## 2023-10-27 MED ORDER — TRIAMCINOLONE ACETONIDE 0.025 % EX CREA: TOPICAL_CREAM | Freq: Two times a day (BID) | CUTANEOUS | 1 refills | Status: DC | PRN

## 2023-10-27 MED ORDER — FUROSEMIDE 20 MG PO TABS
20.0000 mg | ORAL_TABLET | Freq: Every day | ORAL | 1 refills | Status: AC | PRN
Start: 2023-10-27 — End: ?

## 2023-10-27 NOTE — Patient Instructions (Signed)
Basic Skin Care Your skin plays an important role in keeping the entire body healthy.  Below are some tips on how to try and maximize skin health from the outside in.  Bathe in mildly warm water every 1 to 3 days, followed by light drying and an application of a thick moisturizer cream or ointment, preferably one that comes in a tub. Fragrance free moisturizing bars or body washes are preferred such as Purpose, Cetaphil, Dove sensitive skin, Aveeno, [place] Baby or Vanicream products. Use a fragrance free cream or ointment, not a lotion, such as plain petroleum jelly or Vaseline ointment, Aquaphor, Vanicream, Eucerin cream or a generic version, CeraVe Cream, Cetaphil Restoraderm, Aveeno Eczema Therapy and [place] Baby Calming, among others. Children with very dry skin often need to put on these creams two, three or four times a day.  As much as possible, use these creams enough to keep the skin from looking dry. Consider using fragrance free/dye free detergent, such as Arm and Hammer for sensitive skin, Tide Free or All Free.   If I am prescribing a medication to go on the skin, the medicine goes on first to the areas that need it, followed by a thick cream as above to the entire body.  Sun is a major cause of damage to the skin. I recommend sun protection for all of my patients. I prefer physical barriers such as hats with wide brims that cover the ears, long sleeve clothing with SPF protection including rash guards for swimming. These can be found seasonally at outdoor clothing companies, [employer] and [employer] and online at www.coolibar.com, www.uvskinz.com and www.sunprecautions.com. Avoid peak sun between the hours of 10am to 3pm to minimize sun exposure.  I recommend sunscreen for all of my patients older than 6 months of age when in the sun, preferably with broad spectrum coverage and SPF 30 or higher.  For children, I recommend sunscreens that only contain titanium dioxide and/or zinc oxide  in the active ingredients. These do not burn the eyes and appear to be safer than chemical sunscreens. These sunscreens include zinc oxide paste found in the diaper section, Vanicream Broad Spectrum 50+, Aveeno Natural Mineral Protection, Neutrogena Pure and Free Baby, Johnson and Johnson Baby Daily face and body lotion, [place] Baby products, among others. There is no such thing as waterproof sunscreen. All sunscreens should be reapplied after 60-80 minutes of wear.  Spray on sunscreens often use chemical sunscreens which do protect against the sun. However, these can be difficult to apply correctly, especially if wind is present, and can be more likely to irritate the skin.  Long term effects of chemical sunscreens are also not fully known.     

## 2023-10-28 ENCOUNTER — Ambulatory Visit: Payer: Self-pay | Admitting: Family Medicine

## 2023-10-28 LAB — HEPATIC FUNCTION PANEL
ALT: 30 IU/L (ref 0–44)
AST: 30 IU/L (ref 0–40)
Albumin: 4.4 g/dL (ref 3.8–4.9)
Alkaline Phosphatase: 74 IU/L (ref 44–121)
Bilirubin Total: 0.3 mg/dL (ref 0.0–1.2)
Bilirubin, Direct: 0.12 mg/dL (ref 0.00–0.40)
Total Protein: 6.6 g/dL (ref 6.0–8.5)

## 2023-11-03 ENCOUNTER — Ambulatory Visit (HOSPITAL_COMMUNITY)
Admission: RE | Admit: 2023-11-03 | Discharge: 2023-11-03 | Disposition: A | Discharge status: Home / Self Care | Source: Ambulatory Visit | Attending: Oncology | Admitting: Oncology

## 2023-11-03 DIAGNOSIS — Z122 Encounter for screening for malignant neoplasm of respiratory organs: Secondary | ICD-10-CM | POA: Diagnosis present

## 2023-11-03 DIAGNOSIS — Z87891 Personal history of nicotine dependence: Secondary | ICD-10-CM | POA: Insufficient documentation

## 2023-11-08 NOTE — Patient Instructions (Signed)
 SURGICAL WAITING ROOM VISITATION Patients having surgery or a procedure may have no more than 2 support people in the waiting area - these visitors may rotate in the visitor waiting room.   If the patient needs to stay at the hospital during part of their recovery, the visitor guidelines for inpatient rooms apply.  PRE-OP VISITATION  Pre-op nurse will coordinate an appropriate time for 1 support person to accompany the patient in pre-op.  This support person may not rotate.  This visitor will be contacted when the time is appropriate for the visitor to come back in the pre-op area.  Please refer to the [facility] website for the visitor guidelines for Inpatients (after your surgery is over and you are in a regular room).  You are not required to quarantine at this time prior to your surgery. However, you must do this: Hand Hygiene often Do NOT share personal items Notify your provider if you are in close contact with someone who has COVID or you develop fever 100.4 or greater, new onset of sneezing, cough, sore throat, shortness of breath or body aches.  If you test positive for Covid or have been in contact with anyone that has tested positive in the last 10 days please notify you surgeon.    Your procedure is scheduled on:  THURSDAY  November 20, 2023  Report to [facility] Main Entrance: [address] entrance where the [employer] is available.   Report to admitting at: 05:15  AM  Call this number if you have any questions or problems the morning of surgery [phone]  DO NOT EAT OR DRINK ANYTHING AFTER MIDNIGHT THE NIGHT PRIOR TO YOUR SURGERY / PROCEDURE.   FOLLOW  ANY ADDITIONAL PRE OP INSTRUCTIONS YOU RECEIVED FROM YOUR SURGEON'S OFFICE!!!  MAGNESIUM CITRATE:  Obtain one (1)  bottle (8 oz) of Magnesium Citrate at your pharmacy. Drink entire bottle at 12:00 noon the day before your surgery/ procedure.  FLEET ENEMA: Obtain one(1) Fleet Enema (sodium phosphate  7-19 gm / 118  ml enema) and use (according to the directions on the box) the night prior to your surgery.   If you have any questions, please contact your Surgeon's office for additional information.    Oral Hygiene is also important to reduce your risk of infection.        Remember - BRUSH YOUR TEETH THE MORNING OF SURGERY WITH YOUR REGULAR TOOTHPASTE  Do NOT smoke after Midnight the night before surgery.  STOP TAKING all Vitamins, Herbs and supplements 1 week before your surgery.   ASPIRIN- Stop taking 1 week before your surgery.   Take ONLY these medicines the morning of surgery with A SIP OF WATER : Omeprazole , amlodipine , escitalopram .   DO NOT TAKE Furosemide  the morning of your surgery.  You may not have any metal on your body including  jewelry, and body piercing  Do not wear  lotions, powders,  cologne, or deodorant  Men may shave face and neck.  Contacts, Hearing Aids, dentures or bridgework may not be worn into surgery. DENTURES WILL BE REMOVED PRIOR TO SURGERY PLEASE DO NOT APPLY Poly grip OR ADHESIVES!!!  You may bring a small overnight bag with you on the day of surgery, only pack items that are not valuable. [facility] IS NOT RESPONSIBLE   FOR VALUABLES THAT ARE LOST OR STOLEN.   Do not bring your home medications to the hospital. The Pharmacy will dispense medications listed on your medication list to you during your admission  in the Hospital.  Please read over the following fact sheets you were given: IF YOU HAVE QUESTIONS ABOUT YOUR PRE-OP INSTRUCTIONS, PLEASE CALL [phone].   [facility] - Preparing for Surgery Before surgery, you can play an important role.  Because skin is not sterile, your skin needs to be as free of germs as possible.  You can reduce the number of germs on your skin by washing with CHG (chlorahexidine gluconate) soap before surgery.  CHG is an antiseptic cleaner which kills germs and bonds with the skin to continue killing germs even after  washing. Please DO NOT use if you have an allergy to CHG or antibacterial soaps.  If your skin becomes reddened/irritated stop using the CHG and inform your nurse when you arrive at Short Stay. Do not shave (including legs and underarms) for at least 48 hours prior to the first CHG shower.  You may shave your face/neck.  Please follow these instructions carefully:  1.  Shower with CHG Soap the night before surgery and the  morning of surgery.  2.  If you choose to wash your hair, wash your hair first as usual with your normal  shampoo.  3.  After you shampoo, rinse your hair and body thoroughly to remove the shampoo.                             4.  Use CHG as you would any other liquid soap.  You can apply chg directly to the skin and wash.  Gently with a scrungie or clean washcloth.  5.  Apply the CHG Soap to your body ONLY FROM THE NECK DOWN.   Do not use on face/ open                           Wound or open sores. Avoid contact with eyes, ears mouth and genitals (private parts).                       Wash face,  Genitals (private parts) with your normal soap.             6.  Wash thoroughly, paying special attention to the area where your  surgery  will be performed.  7.  Thoroughly rinse your body with warm water  from the neck down.  8.  DO NOT shower/wash with your normal soap after using and rinsing off the CHG Soap.            9.  Pat yourself dry with a clean towel.            10.  Wear clean pajamas.            11.  Place clean sheets on your bed the night of your first shower and do not  sleep with pets.  ON THE DAY OF SURGERY : Do not apply any lotions/deodorants the morning of surgery.  Please wear clean clothes to the hospital/surgery center.    FAILURE TO FOLLOW THESE INSTRUCTIONS MAY RESULT IN THE CANCELLATION OF YOUR SURGERY  PATIENT SIGNATURE_________________________________  NURSE  SIGNATURE__________________________________  ________________________________________________________________________

## 2023-11-08 NOTE — Progress Notes (Signed)
 COVID Vaccine received:  []  No [x]  Yes Date of any COVID positive Test in last 90 days:  PCP - Norene Fielding, DO [phone]  Cardiologist - none  Chest x-ray - 10-14-2023  2v  Epic EKG - 04-2018   will repeat  Stress Test -  ECHO -  Cardiac Cath -  CT Coronary Calcium  score:   Bowel Prep - []  No  [x]   Yes _Mag.Citrate & Fleet  Pacemaker / ICD device [x]  No []  Yes   Spinal Cord Stimulator:[x]  No []  Yes       History of Sleep Apnea? [x]  No []  Yes   CPAP used?- [x]  No []  Yes    Patient has: [x]  NO Hx DM   []  Pre-DM   []  DM1  []   DM2 Does the patient monitor blood sugar?   [x]  N/A   []  No []  Yes   Blood Thinner / Instructions:none Aspirin Instructions:  ASA 81 mg  ERAS Protocol Ordered: [x]  No  []  Yes Patient is to be NPO after: MN Prior  Dental hx: []  Dentures:  []  N/A      []  Bridge or Partial:                   []  Loose or Damaged teeth:   Comments:   Activity level: Able to walk up 2 flights of stairs without becoming significantly short of breath or having chest pain?  []  No   []    Yes  Patient can perform ADLs without assistance. []  No   []   Yes  Anesthesia review: HTN, GERD, anxiety, ? LFTs,  ACDF C6-7,C7-T1 (05-18-2018), ETOH / THC  Patient denies any S&S of respiratory illness or Covid - no shortness of breath, fever, cough or chest pain at PAT appointment.  Patient verbalized understanding and agreement to the Pre-Surgical Instructions that were given to them at this PAT appointment. Patient was also educated of the need to review these PAT instructions again prior to his surgery.I reviewed the appropriate phone numbers to call if they have any and questions or concerns.

## 2023-11-10 ENCOUNTER — Encounter (HOSPITAL_COMMUNITY): Payer: Self-pay

## 2023-11-10 ENCOUNTER — Encounter (HOSPITAL_COMMUNITY)
Admission: RE | Admit: 2023-11-10 | Discharge: 2023-11-10 | Disposition: A | Discharge status: Home / Self Care | Source: Ambulatory Visit | Attending: Urology | Admitting: Urology

## 2023-11-10 ENCOUNTER — Other Ambulatory Visit: Payer: Self-pay

## 2023-11-10 VITALS — BP 132/79 | HR 58 | Temp 97.8°F | Resp 16 | Ht 71.0 in | Wt 180.0 lb

## 2023-11-10 DIAGNOSIS — Z01818 Encounter for other preprocedural examination: Secondary | ICD-10-CM | POA: Insufficient documentation

## 2023-11-10 DIAGNOSIS — F1090 Alcohol use, unspecified, uncomplicated: Secondary | ICD-10-CM | POA: Diagnosis not present

## 2023-11-10 DIAGNOSIS — I10 Essential (primary) hypertension: Secondary | ICD-10-CM | POA: Insufficient documentation

## 2023-11-10 DIAGNOSIS — R7989 Other specified abnormal findings of blood chemistry: Secondary | ICD-10-CM | POA: Insufficient documentation

## 2023-11-10 HISTORY — DX: Other psychoactive substance abuse, uncomplicated: F19.10

## 2023-11-10 LAB — COMPREHENSIVE METABOLIC PANEL WITH GFR
ALT: 28 U/L (ref 0–44)
AST: 30 U/L (ref 15–41)
Albumin: 3.8 g/dL (ref 3.5–5.0)
Alkaline Phosphatase: 55 U/L (ref 38–126)
Anion gap: 10 (ref 5–15)
BUN: 10 mg/dL (ref 6–20)
CO2: 22 mmol/L (ref 22–32)
Calcium: 8.8 mg/dL — ABNORMAL LOW (ref 8.9–10.3)
Chloride: 103 mmol/L (ref 98–111)
Creatinine, Ser: 0.84 mg/dL (ref 0.61–1.24)
GFR, Estimated: >60 mL/min (ref >60)
Glucose, Bld: 83 mg/dL (ref 70–99)
Potassium: 3.9 mmol/L (ref 3.5–5.1)
Sodium: 135 mmol/L (ref 135–145)
Total Bilirubin: 0.8 mg/dL (ref 0.0–1.2)
Total Protein: 7.1 g/dL (ref 6.5–8.1)

## 2023-11-10 LAB — CBC
HCT: 43.3 % (ref 39.0–52.0)
Hemoglobin: 14 g/dL (ref 13.0–17.0)
MCH: 30.5 pg (ref 26.0–34.0)
MCHC: 32.3 g/dL (ref 30.0–36.0)
MCV: 94.3 fL (ref 80.0–100.0)
Platelets: 249 K/uL (ref 150–400)
RBC: 4.59 MIL/uL (ref 4.22–5.81)
RDW: 13.8 % (ref 11.5–15.5)
WBC: 6.3 K/uL (ref 4.0–10.5)
nRBC: 0 % (ref 0.0–0.2)

## 2023-11-14 NOTE — Progress Notes (Signed)
 RN left message for call back to assess any questions/barriers prior to upcoming scheduled surgery on 9/4.

## 2023-11-19 NOTE — H&P (Signed)
 Office Visit Report     11/11/2023   --------------------------------------------------------------------------------   Seth Mcmillan  MRN: [id number]  DOB: Jul 24, 1965, 58 year old Male  SSN: [id number]   PRIMARY CARE:  Ashly M. Jolinda, DO  PRIMARY CARE FAX:  [phone]  REFERRING:  [name]. Jolinda, DO  PROVIDER:  Gretel Ferrara, M.D.  TREATING:  Seth Mcmillan, [name]  LOCATION:  [facility] [phone]     --------------------------------------------------------------------------------   CC/HPI: Pt presents today for pre-operative history and physical exam in anticipation of robotic assisted lap radical prostatectomy with bilateral pelvic lymph node dissection by Dr. Ferrara on 11/20/23. He is doing well and is without complaint.   Pt denies F/C, HA, CP, SOB, N/V, diarrhea/constipation, back pain, flank pain, hematuria, and dysuria.     HX:    CC: Prostate Cancer   PCP: Dr. Norene Jolinda   Mr. Seth Mcmillan is a 58 year old gentleman who was initially diagnosed with very low risk prostate cancer in 2016. He had been recommended undergo repeat biopsy in 2019 but ultimately did not proceed and did not follow-up with urology. He presented to me in February of this year and was noted to have a nodule toward the right base of the prostate. His PSA was 5.1 when checked by his primary care physician in January. After discussion, he was agreeable to undergo further evaluation. He had an MRI of the prostate on 06/25/2023 which indicated a 2 cm PI-RADS 5 lesion of the right mid peripheral zone and another 1.4 cm PI-RADS 4 lesion of the left mid/apex peripheral zone. He proceeded with an MR/ultrasound fusion biopsy in 07/30/2023. 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   Family history: He has a strong family history of prostate cancer with  his father and grandfather.   Imaging studies:  MRI (06/25/2023): Although he has no definite extraprostatic extension, he does have a large area of capsular contact on the right side consistent with his large MRI finding and palpable nodule. No SVI, LAD, or bone lesions noted.  PSMA PET scan (08/29/23): Negative for metastatic disease.   PMH: He has a history of anxiety, depression, gastroesophageal reflux disease, hypertension, hyperlipidemia, and irritable bowel syndrome. He does drink about a case of beer per week.  PSH: No abdominal surgeries. He does have a history of prior diverticulitis and pancreatitis.   TNM stage: cT2a N0 Mx  PSA: 5.1  Gleason score: 4+4 = 8 (grade group 4)  Biopsy (07/30/2023): 19/20 cores positive  Left: Left lateral apex (30%, 3+3 = 6), left lateral mid (20%, 4+4 = 8), left mid (70%, 3+4 = 7), left lateral base (20%, 4+3 = 7), left base (20%, 4+4 = 8)  Right: Right apex (1%, 3+3 = 6), right lateral apex (70%, 3+4 = 7), right mid (60%, 4+3 = 7), right lateral mid (70%, 3+4 = 7), right base (5%, 3+3 = 6), right lateral base 20%, 3+3 = 6)  ROI 1: 4/4 cores positive, Gleason 3+4 = 7 in 70%, 60%, and 90% and Gleason 4+3 = 7 and 95%. Perineural invasion is noted in all cores.  ROI 2: 4/4 cores positive, Gleason 4+3 = 7 in 60%, 70%, and 60% and Gleason 4+4 = 8 and 80%. Perineural invasion noted in 1 core.  Prostate volume: 37.5 cc   Urinary function: IPSS is 8.  Erectile function: SHIM  score is 18. His ability to obtain and maintain an erection is relatively poor. This is slightly improved with oral medication.     ALLERGIES: No Known Drug Allergies    MEDICATIONS: Omeprazole  40 MG Capsule Delayed Release  amLODIPine  Besylate 10 MG Tablet  Aspirin 81 on hold  B Complex  Escitalopram  Oxalate 10 MG Tablet  Fenofibrate  145 MG Tablet  Rosuvastatin  Calcium  40 MG Tablet  Vitamin C  Vitamin D2  Vitamin E     GU PSH: Prostate Needle Biopsy - 07/30/2023       PSH  Notes: neck surgery    NON-GU PSH: Knee Arthroscopy Shoulder Surgery (Unspecified) Surgical Pathology, Gross And Microscopic Examination For Prostate Needle - 07/30/2023 Tonsillectomy Visit Complexity (formerly GPC1X) - 09/26/2023, 08/14/2023, 05/13/2023     GU PMH: Stress Incontinence - 11/05/2023 Prostate Cancer - 10/16/2023, - 09/26/2023, - 08/14/2023, - 07/30/2023, - 05/13/2023, - 2019, - 2018 Urge incontinence - 10/16/2023      PMH Notes: prostate cancer  diverticulitis  pancreatitis   NON-GU PMH: Muscle weakness (generalized) - 11/05/2023, - 10/16/2023 Other muscle spasm - 11/05/2023 Irritable bowel syndrome with diarrhea - 10/16/2023 Anxiety Arthritis GERD Hypercholesterolemia Hypertension    FAMILY HISTORY: Diabetes - Brother Prostate Cancer - Grandfather   SOCIAL HISTORY: Marital Status: Married Current Smoking Status: Patient does not smoke anymore.   Tobacco Use Assessment Completed: Used Tobacco in last 30 days? Does not use smokeless tobacco. Drinks 2 drinks per day.  Patient uses recreational drugs. Uses marijuana. Drinks 1 caffeinated drink per day. Has not had a blood transfusion. Patient's occupation Chartered loss adjuster.     Notes: 2 sons 1 daughter  ETOH beer several per day and more on the weekends  Marijuana one joint per day    REVIEW OF SYSTEMS:    GU Review Male:   Patient denies frequent urination, hard to postpone urination, burning/ pain with urination, get up at night to urinate, leakage of urine, stream starts and stops, trouble starting your stream, have to strain to urinate , erection problems, and penile pain.  Gastrointestinal (Upper):   Patient denies nausea, vomiting, and indigestion/ heartburn.  Gastrointestinal (Lower):   Patient denies diarrhea and constipation.  Constitutional:   Patient denies fever, night sweats, weight loss, and fatigue.  Skin:   Patient denies skin rash/ lesion and itching.  Eyes:   Patient denies blurred vision and double  vision.  Ears/ Nose/ Throat:   Patient denies sore throat and sinus problems.  Hematologic/Lymphatic:   Patient denies swollen glands and easy bruising.  Cardiovascular:   Patient denies leg swelling and chest pains.  Respiratory:   Patient denies cough and shortness of breath.  Endocrine:   Patient denies excessive thirst.  Musculoskeletal:   Patient denies joint pain and back pain.  Neurological:   Patient denies headaches and dizziness.  Psychologic:   Patient denies depression and anxiety.   VITAL SIGNS:      11/11/2023 03:39 PM  Weight 180 lb / 81.65 kg  Height 7 in / 17.78 cm  BP 139/78 mmHg  Pulse 64 /min  BMI 2,582.4 kg/m   MULTI-SYSTEM PHYSICAL EXAMINATION:    Constitutional: Well-nourished. No physical deformities. Normally developed. Good grooming.  Neck: Neck symmetrical, not swollen. Normal tracheal position.  Respiratory: Normal breath sounds. No labored breathing, no use of accessory muscles.   Cardiovascular: Regular rate and rhythm. No murmur, no gallop.   Lymphatic: No enlargement of neck, axillae, groin.  Skin: No paleness, no jaundice, no  cyanosis. No lesion, no ulcer, no rash.  Neurologic / Psychiatric: Oriented to time, oriented to place, oriented to person. No depression, no anxiety, no agitation.  Gastrointestinal: No mass, no tenderness, no rigidity, non obese abdomen.  Eyes: Normal conjunctivae. Normal eyelids.  Ears, Nose, Mouth, and Throat: Left ear no scars, no lesions, no masses. Right ear no scars, no lesions, no masses. Nose no scars, no lesions, no masses. Normal hearing. Normal lips.  Musculoskeletal: Normal gait and station of head and neck.     Complexity of Data:  Records Review:   Previous Patient Records  Urine Test Review:   Urinalysis   11/11/23  Urinalysis  Urine Appearance Clear   Urine Color Yellow   Urine Glucose Neg mg/dL  Urine Bilirubin Neg mg/dL  Urine Ketones Neg mg/dL  Urine Specific Gravity 1.010   Urine Blood Neg ery/uL   Urine pH 6.5   Urine Protein Neg mg/dL  Urine Urobilinogen 0.2 mg/dL  Urine Nitrites Neg   Urine Leukocyte Esterase Neg leu/uL   PROCEDURES:          Urinalysis - 81003 Dipstick Dipstick Cont'd  Color: Yellow Bilirubin: Neg mg/dL  Appearance: Clear Ketones: Neg mg/dL  Specific Gravity: 8.989 Blood: Neg ery/uL  pH: 6.5 Protein: Neg mg/dL  Glucose: Neg mg/dL Urobilinogen: 0.2 mg/dL    Nitrites: Neg    Leukocyte Esterase: Neg leu/uL    ASSESSMENT:      ICD-10 Details  1 GU:   Prostate Cancer - C61    PLAN:            Medications Stop Meds: diazePAM 10 MG Tablet Take 10 mg 30-60 minutes prior to your procedure  Start: 05/13/2023  Discontinue: 11/11/2023  - Reason: finished levoFLOXacin  750 MG Tablet Please take one tablet the morning of your biopsy.  Start: 05/13/2023  Discontinue: 11/11/2023  - Reason: finished           Schedule Return Visit/Planned Activity: Keep Scheduled Appointment - Schedule Surgery          Document Letter(s):  Created for Patient: Clinical Summary         Notes:   There are no changes in the patients history or physical exam since last evaluation by Dr. Renda. Pt is scheduled to undergo RALP with BPLND on 11/20/23.   All pt's questions were answered to the best of my ability.          Next Appointment:      Next Appointment: 11/20/2023 07:15 AM    Appointment Type: Surgery     Location: [facility] [phone]    Provider: Gretel Renda, M.D.    Reason for Visit: WL/OBS RALP LEV 2 AND BPLND WITH AMANDA      * Signed by Seth Clois Hammonds, PA on 11/11/23 at 3:59 PM (EDT)*

## 2023-11-20 ENCOUNTER — Ambulatory Visit (HOSPITAL_COMMUNITY): Admitting: Certified Registered Nurse Anesthetist

## 2023-11-20 ENCOUNTER — Ambulatory Visit (HOSPITAL_COMMUNITY)

## 2023-11-20 ENCOUNTER — Other Ambulatory Visit: Payer: Self-pay

## 2023-11-20 ENCOUNTER — Encounter (HOSPITAL_COMMUNITY)
Admission: RE | Disposition: A | Discharge status: Home / Self Care | Payer: Self-pay | Source: Home / Self Care | Attending: Urology

## 2023-11-20 ENCOUNTER — Observation Stay (HOSPITAL_COMMUNITY)
Admission: RE | Admit: 2023-11-20 | Discharge: 2023-11-21 | Disposition: A | Discharge status: Home / Self Care | Attending: Urology | Admitting: Urology

## 2023-11-20 ENCOUNTER — Ambulatory Visit (HOSPITAL_COMMUNITY): Payer: Self-pay | Admitting: Medical

## 2023-11-20 ENCOUNTER — Encounter (HOSPITAL_COMMUNITY): Payer: Self-pay | Admitting: Urology

## 2023-11-20 DIAGNOSIS — C61 Malignant neoplasm of prostate: Secondary | ICD-10-CM

## 2023-11-20 DIAGNOSIS — Z87891 Personal history of nicotine dependence: Secondary | ICD-10-CM | POA: Diagnosis not present

## 2023-11-20 DIAGNOSIS — Z79899 Other long term (current) drug therapy: Secondary | ICD-10-CM | POA: Diagnosis not present

## 2023-11-20 DIAGNOSIS — I10 Essential (primary) hypertension: Secondary | ICD-10-CM | POA: Diagnosis not present

## 2023-11-20 HISTORY — PX: ROBOT ASSISTED LAPAROSCOPIC RADICAL PROSTATECTOMY: SHX5141

## 2023-11-20 LAB — TYPE AND SCREEN
ABO/RH(D): A POS
Antibody Screen: NEGATIVE

## 2023-11-20 LAB — HEMOGLOBIN AND HEMATOCRIT, BLOOD
HCT: 45.3 % (ref 39.0–52.0)
Hemoglobin: 14.4 g/dL (ref 13.0–17.0)

## 2023-11-20 LAB — ABO/RH: ABO/RH(D): A POS

## 2023-11-20 SURGERY — PROSTATECTOMY, RADICAL, ROBOT-ASSISTED, LAPAROSCOPIC
Anesthesia: General | Site: Pelvis

## 2023-11-20 MED ORDER — TRIPLE ANTIBIOTIC 3.5-400-5000 EX OINT: 1.0000 | TOPICAL_OINTMENT | Freq: Three times a day (TID) | CUTANEOUS | Status: DC | PRN

## 2023-11-20 MED ORDER — HEPARIN SODIUM (PORCINE) 1000 UNIT/ML IJ SOLN
INTRAMUSCULAR | Status: DC | PRN
  Administered 2023-11-20: 1000 mL

## 2023-11-20 MED ORDER — LACTATED RINGERS IV SOLN: INTRAVENOUS | Status: DC | PRN

## 2023-11-20 MED ORDER — ONDANSETRON HCL 4 MG/2ML IJ SOLN
INTRAMUSCULAR | Status: DC | PRN
  Administered 2023-11-20: 4 mg via INTRAVENOUS

## 2023-11-20 MED ORDER — MAGNESIUM CITRATE PO SOLN: 1.0000 | Freq: Once | ORAL | Status: DC

## 2023-11-20 MED ORDER — CEFAZOLIN SODIUM-DEXTROSE 1-4 GM/50ML-% IV SOLN
1.0000 g | Freq: Three times a day (TID) | INTRAVENOUS | Status: AC
  Administered 2023-11-20 (×2): 1 g via INTRAVENOUS
  Filled 2023-11-20 (×2): qty 50

## 2023-11-20 MED ORDER — KETAMINE HCL 50 MG/5ML IJ SOSY
PREFILLED_SYRINGE | INTRAMUSCULAR | Status: AC
  Filled 2023-11-20: qty 5

## 2023-11-20 MED ORDER — AMISULPRIDE (ANTIEMETIC) 5 MG/2ML IV SOLN
10.0000 mg | Freq: Once | INTRAVENOUS | Status: AC | PRN
  Administered 2023-11-20: 10 mg via INTRAVENOUS

## 2023-11-20 MED ORDER — PROPOFOL 10 MG/ML IV BOLUS
INTRAVENOUS | Status: DC | PRN
  Administered 2023-11-20: 200 mg via INTRAVENOUS

## 2023-11-20 MED ORDER — SODIUM CHLORIDE 0.9 % IR SOLN
Status: DC | PRN
  Administered 2023-11-20: 1000 mL via INTRAVESICAL

## 2023-11-20 MED ORDER — EZETIMIBE 10 MG PO TABS
10.0000 mg | ORAL_TABLET | Freq: Every day | ORAL | Status: DC
  Administered 2023-11-20 – 2023-11-21 (×2): 10 mg via ORAL
  Filled 2023-11-20 (×2): qty 1

## 2023-11-20 MED ORDER — LIDOCAINE HCL (PF) 2 % IJ SOLN
INTRAMUSCULAR | Status: DC | PRN
Start: 2023-11-20 — End: 2023-11-20
  Administered 2023-11-20: 60 mg via INTRADERMAL

## 2023-11-20 MED ORDER — CHLORHEXIDINE GLUCONATE 0.12 % MT SOLN
15.0000 mL | Freq: Once | OROMUCOSAL | Status: AC
  Administered 2023-11-20: 15 mL via OROMUCOSAL

## 2023-11-20 MED ORDER — DEXAMETHASONE SODIUM PHOSPHATE 10 MG/ML IJ SOLN
INTRAMUSCULAR | Status: AC
  Filled 2023-11-20: qty 1

## 2023-11-20 MED ORDER — MIDAZOLAM HCL 5 MG/5ML IJ SOLN
INTRAMUSCULAR | Status: DC | PRN
  Administered 2023-11-20: 2 mg via INTRAVENOUS

## 2023-11-20 MED ORDER — SUGAMMADEX SODIUM 200 MG/2ML IV SOLN
INTRAVENOUS | Status: DC | PRN
  Administered 2023-11-20: 200 mg via INTRAVENOUS

## 2023-11-20 MED ORDER — KETAMINE HCL 50 MG/5ML IJ SOSY
PREFILLED_SYRINGE | INTRAMUSCULAR | Status: DC | PRN
  Administered 2023-11-20 (×2): 20 mg via INTRAVENOUS

## 2023-11-20 MED ORDER — SODIUM CHLORIDE 0.9 % IV BOLUS
1000.0000 mL | Freq: Once | INTRAVENOUS | Status: AC
  Administered 2023-11-20: 1000 mL via INTRAVENOUS

## 2023-11-20 MED ORDER — OXYCODONE HCL 5 MG/5ML PO SOLN: 5.0000 mg | Freq: Once | ORAL | Status: DC | PRN

## 2023-11-20 MED ORDER — ZOLPIDEM TARTRATE 5 MG PO TABS: 5.0000 mg | ORAL_TABLET | Freq: Every evening | ORAL | Status: DC | PRN

## 2023-11-20 MED ORDER — SUGAMMADEX SODIUM 200 MG/2ML IV SOLN
INTRAVENOUS | Status: AC
Start: 2023-11-20 — End: 2023-11-20
  Filled 2023-11-20: qty 2

## 2023-11-20 MED ORDER — ROCURONIUM BROMIDE 10 MG/ML (PF) SYRINGE
PREFILLED_SYRINGE | INTRAVENOUS | Status: AC
  Filled 2023-11-20: qty 10

## 2023-11-20 MED ORDER — AMLODIPINE BESYLATE 10 MG PO TABS
10.0000 mg | ORAL_TABLET | Freq: Every day | ORAL | Status: DC
  Administered 2023-11-20 – 2023-11-21 (×2): 10 mg via ORAL
  Filled 2023-11-20 (×2): qty 1

## 2023-11-20 MED ORDER — DOCUSATE SODIUM 100 MG PO CAPS
100.0000 mg | ORAL_CAPSULE | Freq: Two times a day (BID) | ORAL | Status: DC
Start: 2023-11-20 — End: 2023-11-21
  Administered 2023-11-20 – 2023-11-21 (×2): 100 mg via ORAL
  Filled 2023-11-20 (×2): qty 1

## 2023-11-20 MED ORDER — STERILE WATER FOR IRRIGATION IR SOLN
Status: DC | PRN
  Administered 2023-11-20: 1000 mL

## 2023-11-20 MED ORDER — DEXAMETHASONE SODIUM PHOSPHATE 10 MG/ML IJ SOLN
INTRAMUSCULAR | Status: DC | PRN
  Administered 2023-11-20: 10 mg via INTRAVENOUS

## 2023-11-20 MED ORDER — FENTANYL CITRATE PF 50 MCG/ML IJ SOSY
25.0000 ug | PREFILLED_SYRINGE | INTRAMUSCULAR | Status: DC | PRN
  Administered 2023-11-20 (×3): 50 ug via INTRAVENOUS

## 2023-11-20 MED ORDER — DOCUSATE SODIUM 100 MG PO CAPS: 100.0000 mg | ORAL_CAPSULE | Freq: Two times a day (BID) | ORAL | Status: DC

## 2023-11-20 MED ORDER — ONDANSETRON HCL 4 MG/2ML IJ SOLN
4.0000 mg | INTRAMUSCULAR | Status: DC | PRN
Start: 2023-11-20 — End: 2023-11-21
  Administered 2023-11-21: 4 mg via INTRAVENOUS
  Filled 2023-11-20: qty 2

## 2023-11-20 MED ORDER — PANTOPRAZOLE SODIUM 40 MG PO TBEC
40.0000 mg | DELAYED_RELEASE_TABLET | Freq: Every day | ORAL | Status: DC
  Administered 2023-11-20 – 2023-11-21 (×2): 40 mg via ORAL
  Filled 2023-11-20 (×2): qty 1

## 2023-11-20 MED ORDER — KETOROLAC TROMETHAMINE 15 MG/ML IJ SOLN
15.0000 mg | Freq: Four times a day (QID) | INTRAMUSCULAR | Status: DC
  Administered 2023-11-20 – 2023-11-21 (×4): 15 mg via INTRAVENOUS
  Filled 2023-11-20 (×4): qty 1

## 2023-11-20 MED ORDER — HEPARIN SODIUM (PORCINE) 1000 UNIT/ML IJ SOLN
INTRAMUSCULAR | Status: AC
  Filled 2023-11-20: qty 1

## 2023-11-20 MED ORDER — ORAL CARE MOUTH RINSE: 15.0000 mL | Freq: Once | OROMUCOSAL | Status: AC

## 2023-11-20 MED ORDER — LIDOCAINE HCL (PF) 2 % IJ SOLN
INTRAMUSCULAR | Status: AC
  Filled 2023-11-20: qty 5

## 2023-11-20 MED ORDER — MORPHINE SULFATE (PF) 2 MG/ML IV SOLN
2.0000 mg | INTRAVENOUS | Status: DC | PRN
  Administered 2023-11-20 (×3): 2 mg via INTRAVENOUS
  Filled 2023-11-20 (×3): qty 1

## 2023-11-20 MED ORDER — MIDAZOLAM HCL 2 MG/2ML IJ SOLN
INTRAMUSCULAR | Status: AC
  Filled 2023-11-20: qty 2

## 2023-11-20 MED ORDER — FENOFIBRATE 54 MG PO TABS
54.0000 mg | ORAL_TABLET | Freq: Every day | ORAL | Status: DC
  Administered 2023-11-20 – 2023-11-21 (×2): 54 mg via ORAL
  Filled 2023-11-20 (×2): qty 1

## 2023-11-20 MED ORDER — ACETAMINOPHEN 325 MG PO TABS
650.0000 mg | ORAL_TABLET | ORAL | Status: DC | PRN
  Administered 2023-11-21: 650 mg via ORAL
  Filled 2023-11-20: qty 2

## 2023-11-20 MED ORDER — PROPOFOL 10 MG/ML IV BOLUS
INTRAVENOUS | Status: AC
  Filled 2023-11-20: qty 20

## 2023-11-20 MED ORDER — FUROSEMIDE 20 MG PO TABS: 20.0000 mg | ORAL_TABLET | Freq: Every day | ORAL | Status: DC | PRN

## 2023-11-20 MED ORDER — BUPIVACAINE-EPINEPHRINE 0.25% -1:200000 IJ SOLN
INTRAMUSCULAR | Status: DC | PRN
  Administered 2023-11-20: 20 mL
  Administered 2023-11-20: 10 mL

## 2023-11-20 MED ORDER — FLEET ENEMA RE ENEM: 1.0000 | ENEMA | Freq: Once | RECTAL | Status: DC

## 2023-11-20 MED ORDER — ACETAMINOPHEN 500 MG PO TABS
1000.0000 mg | ORAL_TABLET | Freq: Once | ORAL | Status: AC
  Administered 2023-11-20: 1000 mg via ORAL
  Filled 2023-11-20: qty 2

## 2023-11-20 MED ORDER — HYOSCYAMINE SULFATE 0.125 MG SL SUBL: 0.1250 mg | SUBLINGUAL_TABLET | Freq: Four times a day (QID) | SUBLINGUAL | Status: DC | PRN

## 2023-11-20 MED ORDER — FENTANYL CITRATE PF 50 MCG/ML IJ SOSY
PREFILLED_SYRINGE | INTRAMUSCULAR | Status: AC
Start: 2023-11-20 — End: 2023-11-20
  Filled 2023-11-20: qty 1

## 2023-11-20 MED ORDER — DIPHENHYDRAMINE HCL 50 MG/ML IJ SOLN: 12.5000 mg | Freq: Four times a day (QID) | INTRAMUSCULAR | Status: DC | PRN

## 2023-11-20 MED ORDER — ESCITALOPRAM OXALATE 10 MG PO TABS
10.0000 mg | ORAL_TABLET | Freq: Every day | ORAL | Status: DC
  Administered 2023-11-20 – 2023-11-21 (×2): 10 mg via ORAL
  Filled 2023-11-20 (×2): qty 1

## 2023-11-20 MED ORDER — ROCURONIUM BROMIDE 10 MG/ML (PF) SYRINGE
PREFILLED_SYRINGE | INTRAVENOUS | Status: DC | PRN
  Administered 2023-11-20: 50 mg via INTRAVENOUS
  Administered 2023-11-20: 20 mg via INTRAVENOUS
  Administered 2023-11-20: 10 mg via INTRAVENOUS

## 2023-11-20 MED ORDER — OXYCODONE HCL 5 MG PO TABS: 5.0000 mg | ORAL_TABLET | Freq: Once | ORAL | Status: DC | PRN

## 2023-11-20 MED ORDER — AMISULPRIDE (ANTIEMETIC) 5 MG/2ML IV SOLN
INTRAVENOUS | Status: AC
  Filled 2023-11-20: qty 4

## 2023-11-20 MED ORDER — FENTANYL CITRATE (PF) 250 MCG/5ML IJ SOLN
INTRAMUSCULAR | Status: AC
Start: 2023-11-20 — End: 2023-11-20
  Filled 2023-11-20: qty 5

## 2023-11-20 MED ORDER — FENTANYL CITRATE PF 50 MCG/ML IJ SOSY
PREFILLED_SYRINGE | INTRAMUSCULAR | Status: AC
  Filled 2023-11-20: qty 2

## 2023-11-20 MED ORDER — DIPHENHYDRAMINE HCL 12.5 MG/5ML PO ELIX: 12.5000 mg | ORAL_SOLUTION | Freq: Four times a day (QID) | ORAL | Status: DC | PRN

## 2023-11-20 MED ORDER — ONDANSETRON HCL 4 MG/2ML IJ SOLN
INTRAMUSCULAR | Status: AC
  Filled 2023-11-20: qty 2

## 2023-11-20 MED ORDER — SULFAMETHOXAZOLE-TRIMETHOPRIM 800-160 MG PO TABS: 1.0000 | ORAL_TABLET | Freq: Two times a day (BID) | ORAL | 0 refills | Status: DC

## 2023-11-20 MED ORDER — LACTATED RINGERS IV SOLN: INTRAVENOUS | Status: DC

## 2023-11-20 MED ORDER — FENTANYL CITRATE (PF) 100 MCG/2ML IJ SOLN
INTRAMUSCULAR | Status: DC | PRN
  Administered 2023-11-20 (×3): 50 ug via INTRAVENOUS
  Administered 2023-11-20: 100 ug via INTRAVENOUS

## 2023-11-20 MED ORDER — CEFAZOLIN SODIUM-DEXTROSE 2-4 GM/100ML-% IV SOLN
2.0000 g | INTRAVENOUS | Status: AC
  Administered 2023-11-20: 2 g via INTRAVENOUS
  Filled 2023-11-20: qty 100

## 2023-11-20 MED ORDER — KCL IN DEXTROSE-NACL 20-5-0.45 MEQ/L-%-% IV SOLN
INTRAVENOUS | Status: DC
  Filled 2023-11-20 (×4): qty 1000

## 2023-11-20 MED ORDER — TRAMADOL HCL 50 MG PO TABS: 50.0000 mg | ORAL_TABLET | Freq: Four times a day (QID) | ORAL | 0 refills | Status: DC | PRN

## 2023-11-20 MED ORDER — ROSUVASTATIN CALCIUM 20 MG PO TABS: 40.0000 mg | ORAL_TABLET | Freq: Every day | ORAL | Status: DC

## 2023-11-20 MED ORDER — PHENYLEPHRINE HCL-NACL 20-0.9 MG/250ML-% IV SOLN
INTRAVENOUS | Status: DC | PRN
  Administered 2023-11-20: 20 ug/min via INTRAVENOUS

## 2023-11-20 MED ORDER — BUPIVACAINE-EPINEPHRINE (PF) 0.25% -1:200000 IJ SOLN
INTRAMUSCULAR | Status: AC
Start: 2023-11-20 — End: 2023-11-20
  Filled 2023-11-20: qty 30

## 2023-11-20 SURGICAL SUPPLY — 53 items
APPLICATOR COTTON TIP 6 STRL (MISCELLANEOUS) ×2 IMPLANT
BAG COUNTER SPONGE SURGICOUNT (BAG) IMPLANT
CATH FOLEY 2WAY SLVR 18FR 30CC (CATHETERS) ×2 IMPLANT
CATH ROBINSON RED A/P 16FR (CATHETERS) ×2 IMPLANT
CATH ROBINSON RED A/P 8FR (CATHETERS) ×2 IMPLANT
CATH TIEMANN FOLEY 18FR 5CC (CATHETERS) ×2 IMPLANT
CHLORAPREP W/TINT 26 (MISCELLANEOUS) ×2 IMPLANT
CLIP LIGATING HEM O LOK PURPLE (MISCELLANEOUS) ×2 IMPLANT
COVER SURGICAL LIGHT HANDLE (MISCELLANEOUS) ×2 IMPLANT
COVER TIP SHEARS 8 DVNC (MISCELLANEOUS) ×2 IMPLANT
CUTTER ECHEON FLEX ENDO 45 340 (ENDOMECHANICALS) ×2 IMPLANT
DERMABOND ADVANCED .7 DNX12 (GAUZE/BANDAGES/DRESSINGS) ×2 IMPLANT
DRAPE ARM DVNC X/XI (DISPOSABLE) ×8 IMPLANT
DRAPE COLUMN DVNC XI (DISPOSABLE) ×2 IMPLANT
DRAPE SURG IRRIG POUCH 19X23 (DRAPES) ×2 IMPLANT
DRIVER NDL LRG 8 DVNC XI (INSTRUMENTS) ×4 IMPLANT
DRIVER NDLE LRG 8 DVNC XI (INSTRUMENTS) ×4 IMPLANT
DRSG TEGADERM 4X4.75 (GAUZE/BANDAGES/DRESSINGS) ×2 IMPLANT
ELECT PENCIL ROCKER SW 15FT (MISCELLANEOUS) ×2 IMPLANT
ELECT REM PT RETURN 15FT ADLT (MISCELLANEOUS) ×2 IMPLANT
FORCEPS BPLR LNG DVNC XI (INSTRUMENTS) ×2 IMPLANT
FORCEPS PROGRASP DVNC XI (FORCEP) ×2 IMPLANT
GAUZE SPONGE 4X4 12PLY STRL (GAUZE/BANDAGES/DRESSINGS) ×2 IMPLANT
GLOVE BIO SURGEON STRL SZ 6.5 (GLOVE) ×2 IMPLANT
GLOVE SURG LX STRL 7.5 STRW (GLOVE) ×4 IMPLANT
GOWN STRL REUS W/ TWL XL LVL3 (GOWN DISPOSABLE) ×4 IMPLANT
GOWN STRL SURGICAL XL XLNG (GOWN DISPOSABLE) ×2 IMPLANT
HOLDER FOLEY CATH W/STRAP (MISCELLANEOUS) ×2 IMPLANT
IRRIGATION SUCT STRKRFLW 2 WTP (MISCELLANEOUS) ×2 IMPLANT
IV LACTATED RINGERS 1000ML (IV SOLUTION) ×2 IMPLANT
KIT TURNOVER KIT A (KITS) ×2 IMPLANT
NDL SAFETY ECLIPSE 18X1.5 (NEEDLE) IMPLANT
PACK ROBOT UROLOGY CUSTOM (CUSTOM PROCEDURE TRAY) ×2 IMPLANT
PLUG CATH AND CAP STRL 200 (CATHETERS) ×2 IMPLANT
RELOAD STAPLE 45 4.1 GRN THCK (STAPLE) ×2 IMPLANT
SCISSORS LAP 5X45 EPIX DISP (ENDOMECHANICALS) IMPLANT
SCISSORS MNPLR CVD DVNC XI (INSTRUMENTS) ×2 IMPLANT
SEAL UNIV 5-12 XI (MISCELLANEOUS) ×8 IMPLANT
SET TUBE SMOKE EVAC HIGH FLOW (TUBING) ×2 IMPLANT
SOL PREP POV-IOD 4OZ 10% (MISCELLANEOUS) ×2 IMPLANT
SOLUTION ELECTROSURG ANTI STCK (MISCELLANEOUS) ×2 IMPLANT
SPIKE FLUID TRANSFER (MISCELLANEOUS) ×2 IMPLANT
SUT ETHILON 3 0 PS 1 (SUTURE) ×2 IMPLANT
SUT MNCRL 3 0 RB1 (SUTURE) ×2 IMPLANT
SUT MNCRL 3 0 VIOLET RB1 (SUTURE) ×2 IMPLANT
SUT MNCRL AB 4-0 PS2 18 (SUTURE) ×4 IMPLANT
SUT PDS PLUS AB 0 CT-2 (SUTURE) ×4 IMPLANT
SUT VIC AB 0 CT1 27XBRD ANTBC (SUTURE) ×4 IMPLANT
SUT VIC AB 2-0 SH 27X BRD (SUTURE) ×2 IMPLANT
SUT VIC AB 3-0 SH 27X BRD (SUTURE) IMPLANT
SYR 27GX1/2 1ML LL SAFETY (SYRINGE) ×2 IMPLANT
TROCAR Z THREAD OPTICAL 12X100 (TROCAR) IMPLANT
WATER STERILE IRR 1000ML POUR (IV SOLUTION) ×2 IMPLANT

## 2023-11-20 NOTE — Anesthesia Preprocedure Evaluation (Signed)
 Anesthesia Evaluation  Patient identified by MRN, date of birth, ID band Patient awake    Reviewed: Allergy & Precautions, NPO status , Patient's Chart, lab work & pertinent test results  Airway Mallampati: II  TM Distance: >3 FB Neck ROM: Full    Dental  (+) Dental Advisory Given   Pulmonary former smoker   breath sounds clear to auscultation       Cardiovascular hypertension, Pt. on medications  Rhythm:Regular Rate:Normal     Neuro/Psych negative neurological ROS     GI/Hepatic ,GERD  ,,(+)     substance abuse  alcohol use and marijuana use  Endo/Other  negative endocrine ROS    Renal/GU negative Renal ROS     Musculoskeletal  (+) Arthritis ,    Abdominal   Peds  Hematology negative hematology ROS (+)   Anesthesia Other Findings   Reproductive/Obstetrics                              Anesthesia Physical Anesthesia Plan  ASA: 2  Anesthesia Plan: General   Post-op Pain Management: Tylenol  PO (pre-op)* and Ketamine  IV*   Induction: Intravenous  PONV Risk Score and Plan: 2 and Dexamethasone , Ondansetron  and Treatment may vary due to age or medical condition  Airway Management Planned: Oral ETT  Additional Equipment: None  Intra-op Plan:   Post-operative Plan: Extubation in OR  Informed Consent: I have reviewed the patients History and Physical, chart, labs and discussed the procedure including the risks, benefits and alternatives for the proposed anesthesia with the patient or authorized representative who has indicated his/her understanding and acceptance.     Dental advisory given  Plan Discussed with: CRNA  Anesthesia Plan Comments:         Anesthesia Quick Evaluation

## 2023-11-20 NOTE — Op Note (Signed)
 Preoperative diagnosis: Clinically localized adenocarcinoma of the prostate (clinical stage T2a N0 M0)  Postoperative diagnosis: Clinically localized adenocarcinoma of the prostate (clinical stage T2a N0 M0)  Procedure:  Robotic assisted laparoscopic radical prostatectomy (left nerve sparing) Bilateral robotic assisted laparoscopic pelvic lymphadenectomy  Surgeon: Gretel CANDIE Renda Mickey. M.D.  Assistant(s): Alan Hammonds, PA-C  An assistant was required for this surgical procedure.  The duties of the assistant included but were not limited to suctioning, passing suture, camera manipulation, retraction. This procedure would not be able to be performed without an Geophysicist/field seismologist.   Anesthesia: General  Complications: None  EBL: 100 mL  IVF:  1500 mL crystalloid  Specimens: Prostate and seminal vesicles Right pelvic lymph nodes Left pelvic lymph nodes  Disposition of specimens: Pathology  Drains: 20 Fr coude catheter # 19 Blake pelvic drain  Indication: Seth Mcmillan is a 58 y.o. patient with clinically localized prostate cancer.  After a thorough review of the management options for treatment of prostate cancer, he elected to proceed with surgical therapy and the above procedure(s).  We have discussed the potential benefits and risks of the procedure, side effects of the proposed treatment, the likelihood of the patient achieving the goals of the procedure, and any potential problems that might occur during the procedure or recuperation. Informed consent has been obtained.  Description of procedure:  The patient was taken to the operating room and a general anesthetic was administered. He was given preoperative antibiotics, placed in the dorsal lithotomy position, and prepped and draped in the usual sterile fashion. Next a preoperative timeout was performed. A urethral catheter was placed into the bladder and a site was selected near the umbilicus for placement of the camera port. This was  placed using a standard open Hassan technique which allowed entry into the peritoneal cavity under direct vision and without difficulty. An 8 mm port was placed and a pneumoperitoneum established. The camera was then used to inspect the abdomen and there was no evidence of any intra-abdominal injuries or other abnormalities. The remaining abdominal ports were then placed. 8 mm robotic ports were placed in the right lower quadrant, left lower quadrant, and far left lateral abdominal wall. A 5 mm port was placed in the right upper quadrant and a 12 mm port was placed in the right lateral abdominal wall for laparoscopic assistance. All ports were placed under direct vision without difficulty. The surgical cart was then docked.   Utilizing the cautery scissors, the bladder was reflected posteriorly allowing entry into the space of Retzius and identification of the endopelvic fascia and prostate. The periprostatic fat was then removed from the prostate allowing full exposure of the endopelvic fascia. The endopelvic fascia was then incised from the apex back to the base of the prostate bilaterally and the underlying levator muscle fibers were swept laterally off the prostate thereby isolating the dorsal venous complex. The dorsal vein was then stapled and divided with a 45 mm Flex Echelon stapler. Attention then turned to the bladder neck which was divided anteriorly thereby allowing entry into the bladder and exposure of the urethral catheter. The catheter balloon was deflated and the catheter was brought into the operative field and used to retract the prostate anteriorly. The posterior bladder neck was then examined and was divided allowing further dissection between the bladder and prostate posteriorly until the vasa deferentia and seminal vessels were identified. The vasa deferentia were isolated, divided, and lifted anteriorly. The seminal vesicles were dissected down to their tips  with care to control the seminal  vascular arterial blood supply. These structures were then lifted anteriorly and the space between Denonvillier's fascia and the anterior rectum was developed with a combination of sharp and blunt dissection. This isolated the vascular pedicles of the prostate.  The lateral prostatic fascia on the left side of the prostate was then sharply incised allowing release of the neurovascular bundle. The vascular pedicle of the prostate on the left side was then ligated with Weck clips between the prostate and neurovascular bundle and divided with sharp cold scissor dissection resulting in neurovascular bundle preservation. On the right side, a wide non nerve sparing dissection was performed with Weck clips used to ligate the vascular pedicle of the prostate. The neurovascular bundle on the left side was then separated off the apex of the prostate and urethra.  The urethra was then sharply transected allowing the prostate specimen to be disarticulated. The pelvis was copiously irrigated and hemostasis was ensured. There was no evidence for rectal injury.  Attention then turned to the right pelvic sidewall. The fibrofatty tissue between the external iliac vein, confluence of the iliac vessels, hypogastric artery, and Cooper's ligament was dissected free from the pelvic sidewall with care to preserve the obturator nerve. Weck clips were used for lymphostasis and hemostasis. An identical procedure was performed on the contralateral side and the lymphatic packets were removed for permanent pathologic analysis.  Attention then turned to the urethral anastomosis. A 2-0 Vicryl slip knot was placed between Denonvillier's fascia, the posterior bladder neck, and the posterior urethra to reapproximate these structures. A double-armed 3-0 Monocryl suture was then used to perform a 360 running tension-free anastomosis between the bladder neck and urethra. A new urethral catheter was then placed into the bladder and irrigated.  There were no blood clots within the bladder and the anastomosis appeared to be watertight. A #19 Blake drain was then brought through the left lateral 8 mm port site and positioned appropriately within the pelvis. It was secured to the skin with a nylon suture. The surgical cart was then undocked. The right lateral 12 mm port site was closed at the fascial level with a 0 Vicryl suture placed laparoscopically. All remaining ports were then removed under direct vision. The prostate specimen was removed intact within the Endopouch retrieval bag via the periumbilical camera port site. This fascial opening was closed with two running 0 PDS sutures. 0.25% Marcaine  was then injected into all port sites and all incisions were reapproximated at the skin level with 4-0 Monocryl subcuticular sutures and Dermabond. The patient appeared to tolerate the procedure well and without complications. The patient was able to be extubated and transferred to the recovery unit in satisfactory condition.   Gretel CANDIE Renda Teddie MD

## 2023-11-20 NOTE — Discharge Instructions (Signed)

## 2023-11-20 NOTE — Progress Notes (Signed)
 Patient ID: Seth Mcmillan, male   DOB: September 20, 1965, 58 y.o.   MRN: [id number]  Post-op note  Subjective: The patient is doing well.  No complaints.  Objective: Vital signs in last 24 hours: Temp:  [97.4 F (36.3 C)-98.1 F (36.7 C)] 98.1 F (36.7 C) (09/04 1108) Pulse Rate:  [71-107] 94 (09/04 1108) Resp:  [12-16] 14 (09/04 1108) BP: (152-172)/(86-93) 163/87 (09/04 1108) SpO2:  [92 %-100 %] 99 % (09/04 1108) Weight:  [81.6 kg] 81.6 kg (09/04 1104)  Intake/Output from previous day: No intake/output data recorded. Intake/Output this shift: Total I/O In: 1800 [I.V.:1700; IV Piggyback:100] Out: 1555 [Urine:1425; Drains:30; Blood:100]  Physical Exam:  General: Alert and oriented. Abdomen: Soft, Nondistended. Incisions: Clean and dry. GU: Urine clear.  Lab Results: Recent Labs    11/20/23 1021  HGB 14.4  HCT 45.3    Assessment/Plan: POD#0   1) Continue to monitor, ambulate, IS   Seth Mcmillan. MD   LOS: 0 days   Seth Mcmillan 11/20/2023, 2:59 PM

## 2023-11-20 NOTE — Transfer of Care (Signed)
 Immediate Anesthesia Transfer of Care Note  Patient: Seth Mcmillan  Procedure(s) Performed: PROSTATECTOMY, RADICAL, ROBOT-ASSISTED, LAPAROSCOPIC (Pelvis) LYMPHADENECTOMY, PELVIS, ROBOT-ASSISTED (Bilateral: Pelvis)  Patient Location: PACU  Anesthesia Type:General  Level of Consciousness: awake, alert , and oriented  Airway & Oxygen Therapy: Patient Spontanous Breathing and Patient connected to face mask oxygen  Post-op Assessment: Report given to RN and Post -op Vital signs reviewed and stable  Post vital signs: Reviewed and stable  Last Vitals:  Vitals Value Taken Time  BP 192/98 11/20/23 09:56  Temp    Pulse 102 11/20/23 09:57  Resp 15 11/20/23 09:57  SpO2 96 % 11/20/23 09:57  Vitals shown include unfiled device data.  Last Pain:  Vitals:   11/20/23 0706  TempSrc:   PainSc: 0-No pain         Complications: No notable events documented.

## 2023-11-20 NOTE — Anesthesia Procedure Notes (Signed)
 Procedure Name: Intubation Date/Time: 11/20/2023 7:31 AM  Performed by: Zulema Leita PARAS, CRNAPre-anesthesia Checklist: Patient identified, Emergency Drugs available, Suction available and Patient being monitored Patient Re-evaluated:Patient Re-evaluated prior to induction Oxygen Delivery Method: Circle system utilized Preoxygenation: Pre-oxygenation with 100% oxygen Induction Type: IV induction Ventilation: Mask ventilation without difficulty Laryngoscope Size: Glidescope and 4 Grade View: Grade I Tube type: Oral Tube size: 7.5 mm Number of attempts: 1 Airway Equipment and Method: Stylet Placement Confirmation: ETT inserted through vocal cords under direct vision, positive ETCO2 and breath sounds checked- equal and bilateral Secured at: 22 cm Tube secured with: Tape Dental Injury: Teeth and Oropharynx as per pre-operative assessment  Difficulty Due To: Difficulty was anticipated Comments: Elective Glidescope intubation based on prior intubation documentation, surgical history and airway assessment. Easy mask, DL with Glidescope 4 and Grade 1 view. ETT gently placed and placement confirmed by Dr Epifanio.

## 2023-11-20 NOTE — Progress Notes (Signed)
 Chaplains received a spiritual care consult that Seth Mcmillan was requesting prayer.  He has never experienced anything like this and it has been really difficult for him.  I met with Seth Mcmillan and provided emotional and spiritual support through listening and prayer.

## 2023-11-20 NOTE — Interval H&P Note (Signed)
 History and Physical Interval Note:  11/20/2023 6:51 AM  Seth Mcmillan  has presented today for surgery, with the diagnosis of PROSTATE CANCER.  The various methods of treatment have been discussed with the patient and family. After consideration of risks, benefits and other options for treatment, the patient has consented to  Procedure(s) with comments: PROSTATECTOMY, RADICAL, ROBOT-ASSISTED, LAPAROSCOPIC (N/A) - LEVEL 2 LYMPHADENECTOMY, PELVIS, ROBOT-ASSISTED (Bilateral) as a surgical intervention.  The patient's history has been reviewed, patient examined, no change in status, stable for surgery.  I have reviewed the patient's chart and labs.  Questions were answered to the patient's satisfaction.     [employer]

## 2023-11-21 ENCOUNTER — Encounter (HOSPITAL_COMMUNITY): Payer: Self-pay | Admitting: Urology

## 2023-11-21 DIAGNOSIS — C61 Malignant neoplasm of prostate: Secondary | ICD-10-CM | POA: Diagnosis not present

## 2023-11-21 LAB — HEMOGLOBIN AND HEMATOCRIT, BLOOD
HCT: 38.6 % — ABNORMAL LOW (ref 39.0–52.0)
Hemoglobin: 12.7 g/dL — ABNORMAL LOW (ref 13.0–17.0)

## 2023-11-21 MED ORDER — BISACODYL 10 MG RE SUPP
10.0000 mg | Freq: Once | RECTAL | Status: AC
  Administered 2023-11-21: 10 mg via RECTAL
  Filled 2023-11-21: qty 1

## 2023-11-21 MED ORDER — TRAMADOL HCL 50 MG PO TABS
50.0000 mg | ORAL_TABLET | Freq: Four times a day (QID) | ORAL | Status: DC | PRN
  Administered 2023-11-21: 100 mg via ORAL
  Filled 2023-11-21: qty 2

## 2023-11-21 NOTE — Plan of Care (Signed)
  Problem: Education: Goal: Knowledge of the procedure and recovery process will improve Outcome: Progressing   Problem: Bowel/Gastric: Goal: Gastrointestinal status for postoperative course will improve Outcome: Progressing   Problem: Pain Management: Goal: General experience of comfort will improve Outcome: Progressing   Problem: Skin Integrity: Goal: Demonstration of wound healing without infection will improve Outcome: Progressing   Problem: Urinary Elimination: Goal: Ability to avoid or minimize complications of infection will improve Outcome: Progressing

## 2023-11-21 NOTE — Discharge Summary (Signed)
 Date of admission: 11/20/2023  Date of discharge: 11/21/2023  Admission diagnosis: Prostate Cancer  Discharge diagnosis: Prostate Cancer  History and Physical: For full details, please see admission history and physical. Briefly, Seth Mcmillan is a 59 y.o. gentleman with localized prostate cancer.  After discussing management/treatment options, he elected to proceed with surgical treatment.  Hospital Course: Seth Mcmillan was taken to the operating room on 11/20/2023 and underwent a robotic assisted laparoscopic radical prostatectomy. He tolerated this procedure well and without complications. Postoperatively, he was able to be transferred to a regular hospital room following recovery from anesthesia.  He was able to begin ambulating the night of surgery. He remained hemodynamically stable overnight.  He had excellent urine output with appropriately minimal output from his pelvic drain and his pelvic drain was removed on POD #1.  He was transitioned to oral pain medication, tolerated a clear liquid diet, and had met all discharge criteria and was able to be discharged home later on POD#1.  Laboratory values:  Recent Labs    11/20/23 1021 11/21/23 0413  HGB 14.4 12.7*  HCT 45.3 38.6*    Disposition: Home  Discharge instruction: He was instructed to be ambulatory but to refrain from heavy lifting, strenuous activity, or driving. He was instructed on urethral catheter care.  Discharge medications:   Allergies as of 11/21/2023   No Known Allergies      Medication List     STOP taking these medications    aspirin EC 81 MG tablet   B COMPLEX PO   celecoxib  200 MG capsule Commonly known as: CeleBREX    cholecalciferol 25 MCG (1000 UNIT) tablet Commonly known as: VITAMIN D3   vitamin C 1000 MG tablet   VITAMIN E PO       TAKE these medications    amLODipine  10 MG tablet Commonly known as: NORVASC  Take 1 tablet (10 mg total) by mouth daily. **Dose change   docusate sodium  100  MG capsule Commonly known as: COLACE Take 1 capsule (100 mg total) by mouth 2 (two) times daily.   escitalopram  10 MG tablet Commonly known as: LEXAPRO  Take 1 tablet (10 mg total) by mouth daily.   ezetimibe  10 MG tablet Commonly known as: Zetia  Take 1 tablet (10 mg total) by mouth daily.   fenofibrate  145 MG tablet Commonly known as: TRICOR  Take 1 tablet (145 mg total) by mouth daily.   furosemide  20 MG tablet Commonly known as: LASIX  Take 1 tablet (20 mg total) by mouth daily as needed for fluid.   omeprazole  40 MG capsule Commonly known as: PRILOSEC Take 1 capsule (40 mg total) by mouth daily.   ondansetron  4 MG tablet Commonly known as: ZOFRAN  TAKE 1 TABLET BY MOUTH EVERY 8 HOURS AS NEEDED   pyrithione zinc 1 % shampoo Commonly known as: HEAD AND SHOULDERS Apply 1 Application topically daily as needed for itching.   rosuvastatin  40 MG tablet Commonly known as: CRESTOR  Take 1 tablet (40 mg total) by mouth daily.   sulfamethoxazole -trimethoprim  800-160 MG tablet Commonly known as: BACTRIM  DS Take 1 tablet by mouth 2 (two) times daily. Start the day prior to foley removal appointment   traMADol  50 MG tablet Commonly known as: Ultram  Take 1-2 tablets (50-100 mg total) by mouth every 6 (six) hours as needed for moderate pain (pain score 4-6) or severe pain (pain score 7-10).   triamcinolone  0.025%-Cerave equivalent 1:1 cream mixture Apply topically 2 (two) times daily as needed for itching or rash.  Followup: He will followup in 1 week for catheter removal and to discuss his surgical pathology results.

## 2023-11-21 NOTE — Progress Notes (Signed)
 Patient ID: Seth Mcmillan, male   DOB: 04-20-1965, 58 y.o.   MRN: [id number]  1 Day Post-Op Subjective: The patient is doing well.  No nausea or vomiting. Pain is adequately controlled.  Objective: Vital signs in last 24 hours: Temp:  [97.6 F (36.4 C)-98.9 F (37.2 C)] 97.7 F (36.5 C) (09/05 0651) Pulse Rate:  [66-107] 68 (09/05 0651) Resp:  [12-18] 18 (09/05 0651) BP: (144-172)/(82-93) 154/82 (09/05 0651) SpO2:  [92 %-100 %] 97 % (09/05 0651) Weight:  [81.6 kg] 81.6 kg (09/04 1104)  Intake/Output from previous day: 09/04 0701 - 09/05 0700 In: 4765.7 [P.O.:540; I.V.:4075.7; IV Piggyback:150] Out: 5314 [Urine:4425; Drains:160; Blood:100] Intake/Output this shift: Total I/O In: -  Out: 630 [Urine:630]  Physical Exam:  General: Alert and oriented. CV: RRR Lungs: Clear bilaterally. GI: Soft, Nondistended. Incisions: Clean, dry, and intact Urine: Clear Extremities: Nontender, no erythema, no edema.  Lab Results: Recent Labs    11/20/23 1021 11/21/23 0413  HGB 14.4 12.7*  HCT 45.3 38.6*      Assessment/Plan: POD# 1 s/p robotic prostatectomy.  1) SL IVF 2) Ambulate, Incentive spirometry 3) Transition to oral pain medication 4) Dulcolax suppository 5) D/C pelvic drain 6) Plan for likely discharge later today   Seth Mcmillan. MD   LOS: 0 days   Seth Mcmillan 11/21/2023, 7:44 AM

## 2023-11-21 NOTE — Progress Notes (Signed)
 Foley care education and extra supplies given to patient w/ spouse at the bedside.

## 2023-11-21 NOTE — Plan of Care (Signed)
  Problem: Education: Goal: Knowledge of the procedure and recovery process will improve 11/21/2023 0800 by Alaina Dozier PARAS, RN Outcome: Adequate for Discharge 11/21/2023 [phone] by Alaina Dozier PARAS, RN Outcome: Progressing   Problem: Bowel/Gastric: Goal: Gastrointestinal status for postoperative course will improve 11/21/2023 0800 by Alaina Dozier PARAS, RN Outcome: Adequate for Discharge 11/21/2023 [phone] by Alaina Dozier PARAS, RN Outcome: Progressing   Problem: Pain Management: Goal: General experience of comfort will improve 11/21/2023 0800 by Alaina Dozier PARAS, RN Outcome: Adequate for Discharge 11/21/2023 0738 by Alaina Dozier PARAS, RN Outcome: Progressing   Problem: Skin Integrity: Goal: Demonstration of wound healing without infection will improve 11/21/2023 0800 by Alaina Dozier PARAS, RN Outcome: Adequate for Discharge 11/21/2023 0738 by Alaina Dozier PARAS, RN Outcome: Progressing   Problem: Urinary Elimination: Goal: Ability to avoid or minimize complications of infection will improve 11/21/2023 0800 by Alaina Dozier PARAS, RN Outcome: Adequate for Discharge 11/21/2023 [phone] by Alaina Dozier PARAS, RN Outcome: Progressing Goal: Ability to achieve and maintain urine output will improve Outcome: Adequate for Discharge Goal: Home care management will improve Outcome: Adequate for Discharge   Problem: Education: Goal: Knowledge of General Education information will improve Description: Including pain rating scale, medication(s)/side effects and non-pharmacologic comfort measures Outcome: Adequate for Discharge   Problem: Health Behavior/Discharge Planning: Goal: Ability to manage health-related needs will improve Outcome: Adequate for Discharge   Problem: Clinical Measurements: Goal: Ability to maintain clinical measurements within normal limits will improve Outcome: Adequate for Discharge Goal: Will remain free from infection Outcome: Adequate for  Discharge Goal: Diagnostic test results will improve Outcome: Adequate for Discharge Goal: Respiratory complications will improve Outcome: Adequate for Discharge Goal: Cardiovascular complication will be avoided Outcome: Adequate for Discharge   Problem: Activity: Goal: Risk for activity intolerance will decrease Outcome: Adequate for Discharge   Problem: Nutrition: Goal: Adequate nutrition will be maintained Outcome: Adequate for Discharge   Problem: Coping: Goal: Level of anxiety will decrease Outcome: Adequate for Discharge   Problem: Elimination: Goal: Will not experience complications related to bowel motility Outcome: Adequate for Discharge Goal: Will not experience complications related to urinary retention Outcome: Adequate for Discharge   Problem: Pain Managment: Goal: General experience of comfort will improve and/or be controlled Outcome: Adequate for Discharge   Problem: Safety: Goal: Ability to remain free from injury will improve Outcome: Adequate for Discharge   Problem: Skin Integrity: Goal: Risk for impaired skin integrity will decrease Outcome: Adequate for Discharge

## 2023-11-21 NOTE — Anesthesia Postprocedure Evaluation (Signed)
 Anesthesia Post Note  Patient: Seth Mcmillan  Procedure(s) Performed: PROSTATECTOMY, RADICAL, ROBOT-ASSISTED, LAPAROSCOPIC (Pelvis) LYMPHADENECTOMY, PELVIS, ROBOT-ASSISTED (Bilateral: Pelvis)     Patient location during evaluation: PACU Anesthesia Type: General Level of consciousness: awake and alert Pain management: pain level controlled Vital Signs Assessment: post-procedure vital signs reviewed and stable Respiratory status: spontaneous breathing, nonlabored ventilation, respiratory function stable and patient connected to nasal cannula oxygen Cardiovascular status: blood pressure returned to baseline and stable Postop Assessment: no apparent nausea or vomiting Anesthetic complications: no   No notable events documented.  Last Vitals:  Vitals:   11/21/23 0353 11/21/23 0651  BP: (!) 155/85 (!) 154/82  Pulse: 66 68  Resp: 18 18  Temp: 36.4 C 36.5 C  SpO2: 97% 97%    Last Pain:  Vitals:   11/21/23 0919  TempSrc:   PainSc: 7                  Epifanio Lamar BRAVO

## 2023-11-25 ENCOUNTER — Encounter (HOSPITAL_COMMUNITY): Payer: Self-pay

## 2023-11-25 NOTE — ED Provider Notes (Addendum)
 Emergency Department Provider Note    ED Clinical Impression   Final diagnoses:  History of prostatectomy (Primary)  Foley catheter in place    ED Assessment/Plan    History   Chief Complaint  Seth Mcmillan presents with  . Catheter Problem   HPI  20 0 0 hours. Seth Mcmillan 58 year old gentleman on September 4 had surgery by Dr. Renda at [facility] for prostatectomy he had a catheter placed he was leaking today by exam awake alert postop/good he has a catheter in the correct place with some bloody urine drainage no other findings no leak is currently plan to irrigate the Foley will discuss case with the on-call urologist at [facility]  Past Medical History[1]  Past Surgical History[2]  Family History[3]  Social History[4]  Review of Systems  Genitourinary:  Positive for hematuria. Negative for dysuria.  All other systems reviewed and are negative.   Physical Exam   BP 150/87   Pulse 89   Temp 36.5 C (97.7 F) (Oral)   Resp 18   Ht 180.3 cm (5' 11)   Wt 84.5 kg (186 lb 4 oz)   SpO2 96%   BMI 25.98 kg/m   Physical Exam  Vital signs have been reviewed. Seth Mcmillan is well-appearing w/o respiratory distress shock or major trauma. HEENT is atraumatic.  Neck shows unimpaired range of motion. Chest No increased work of breathing or audible wheezing. Cardiac Good perfusion throughout. Abdomen is not distended.  Operative changes at the port site Extremities are without significant trauma. Skin no visualized rash. Neuro exam is grossly non-focal. Psych exam shows normal mood and behavior.  ED Course    Urinary catheter in the urethra draining bloody urine   Medical Decision Making    Case reviewed with urology recommending conservative management and not replacement of Foley to call back if there is difficulty with clearing the urine otherwise follow-up this week in the office  2045 hrs. Catheter flushed without difficulty continue current antibiotics postop       Dallara, John James, MD 11/25/23 2003    Dallara, John James, MD 11/25/23 2025       [1] Past Medical History: Diagnosis Date  . Acute pancreatitis (HHS-HCC) 03/22/2020  . Chronic alcohol abuse 03/22/2020  . GERD (gastroesophageal reflux disease)   . History of tobacco abuse   . Hyperlipidemia   . Hypertension   . Hyponatremia 03/22/2020  [2] No past surgical history on file. [3] History reviewed. No pertinent family history. [4] Social History Socioeconomic History  . Marital status: Divorced    Spouse name: None  . Number of children: None  . Years of education: None  . Highest education level: None  Tobacco Use  . Smoking status: Former  . Smokeless tobacco: Never  Substance and Sexual Activity  . Alcohol use: Yes    Alcohol/week: 2.0 standard drinks of alcohol    Types: 2 Cans of beer per week    Comment: weekend  . Drug use: Never  . Sexual activity: Yes    Partners: Female   Social Drivers of Corporate investment banker Strain: Low Risk  (10/26/2023)   Received from [facility]   Overall Financial Resource Strain (CARDIA)   . How hard is it for you to pay for the very basics like food, housing, medical care, and heating?: Not very hard  Food Insecurity: No Food Insecurity (11/20/2023)   Received from [facility]   Hunger Vital Sign   . Within the past 12  months, you worried that your food would run out before you got the money to buy more.: Never true   . Within the past 12 months, the food you bought just didn't last and you didn't have money to get more.: Never true  Transportation Needs: Seth Mcmillan Declined (11/20/2023)   Received from [facility] - Transportation   . In the past 12 months, has lack of transportation kept you from medical appointments or from getting medications?: Seth Mcmillan declined   . In the past 12 months, has lack of transportation kept you from meetings, work, or from getting things needed for daily living?: Seth Mcmillan declined   Physical Activity: Unknown (10/26/2023)   Received from [facility]   Exercise Vital Sign   . On average, how many days per week do you engage in moderate to strenuous exercise (like a brisk walk)?: Seth Mcmillan declined  Stress: No Stress Concern Present (10/26/2023)   Received from [facility] - Occupational Stress Questionnaire   . Do you feel stress - tense, restless, nervous, or anxious, or unable to sleep at night because your mind is troubled all the time - these days?: Only a little  Social Connections: Moderately Integrated (11/20/2023)   Received from [facility]   Social Connection and Isolation Panel   . In a typical week, how many times do you talk on the phone with family, friends, or neighbors?: More than three times a week   . How often do you get together with friends or relatives?: More than three times a week   . How often do you attend church or religious services?: More than 4 times per year   . Do you belong to any clubs or organizations such as church groups, unions, fraternal or athletic groups, or school groups?: No   . How often do you attend meetings of the clubs or organizations you belong to?: Never   . Are you married, widowed, divorced, separated, never married, or living with a partner?: Married   Dallara, Norleen Agent, MD 11/25/23 2043

## 2023-11-25 NOTE — ED Triage Notes (Signed)
 Patient here from home for evaluation of catheter leaking and penis swelling. Patient states that he has prostate removal sx at [facility] 5 days ago and it was placed during surgery. Catheter is still draining normally.

## 2023-11-26 LAB — SURGICAL PATHOLOGY

## 2023-11-27 NOTE — Progress Notes (Signed)
 Patient will have 1 week post op appointment with Dr. Renda today, 9/11, at 3:00 pm.

## 2023-12-30 DIAGNOSIS — C61 Malignant neoplasm of prostate: Secondary | ICD-10-CM

## 2023-12-30 NOTE — Progress Notes (Signed)
 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  Patient proceed with treatment recommendations of robotic prostatectomy  and had his surgery on 11/20/23.   RN spoke with patient to follow up. Patient reports he is doing well, and very pleased with his progress from PT.  Survivorship referral placed, no additional needs at this time.

## 2024-01-12 ENCOUNTER — Other Ambulatory Visit: Payer: Self-pay | Admitting: Family Medicine

## 2024-01-12 DIAGNOSIS — I10 Essential (primary) hypertension: Secondary | ICD-10-CM

## 2024-01-14 ENCOUNTER — Ambulatory Visit: Payer: Self-pay

## 2024-01-14 NOTE — Telephone Encounter (Signed)
 FYI Only or Action Required?: FYI only for provider: appointment scheduled on 01/16/24.  Patient was last seen in primary care on 10/27/2023 by [name], DO.  Called Nurse Triage reporting Toe Injury.  Symptoms began about a month ago.  Interventions attempted: Ice/heat application.  Symptoms are: unchanged.  Triage Disposition: See Physician Within 24 Hours  Patient/caregiver understands and will follow disposition?: Yes  RN offered appt on 10/30, pt preferred 10/31 with Dr. Jolinda.    Copied from CRM #[id number]. Topic: Clinical - Red Word Triage >> Jan 14, 2024  4:35 PM Kevelyn M wrote: Red Word that prompted transfer to Nurse Triage: Patient kicked a comode and his right big toe and it is swollen. This incident occurred on September 22nd and it is still swollen and bent towards the right. Reason for Disposition  [1] Toe injury AND [2] bad limp or can't wear shoes or sandals  Answer Assessment - Initial Assessment Questions Pt states that he and his wife had an argument and he kicked a commode with tennis shoes on out of anger. He states initially it was black and blue and swollen. He stats that as the day goes on he has difficulty walking. He states he can push on the top and it feels like something moves in there. He states that he has tried buddy taping it, ice, elevation, rest. He states that it is still swollen and slightly bent to to the right. Sore black and blue initially Now have difficulty walking  Tape toe beside     1. MECHANISM: How did the injury happen?      Kicked a toilet.  2. ONSET: When did the injury happen? (e.g., minutes, hours ago)      September 22nd 3. LOCATION: What part of the toe is injured? Is the nail damaged?      Right big toe 4. APPEARANCE of TOE INJURY: What does the injury look like?      Still swelled up and bent to the right 5. SEVERITY: Can you use the foot normally? Can you walk?      Hard to walk normal as  day goes on 6. SIZE: For cuts, bruises, or swelling, ask: How large is it? (e.g., inches or centimeters;  entire toe)      Whole toe swollen and bent to right 7. PAIN: Is there pain? If Yes, ask: How bad is the pain?   What does it keep you from doing? (Scale 0-10; or none, mild, moderate, severe)     8 at night after walking  10. OTHER SYMPTOMS: Do you have any other symptoms?        no  Protocols used: Toe Injury-A-AH

## 2024-01-14 NOTE — Telephone Encounter (Signed)
 Noted, LS

## 2024-01-16 ENCOUNTER — Ambulatory Visit: Admitting: Family Medicine

## 2024-01-16 ENCOUNTER — Encounter: Payer: Self-pay | Admitting: Family Medicine

## 2024-01-16 ENCOUNTER — Other Ambulatory Visit: Payer: Self-pay | Admitting: *Deleted

## 2024-01-16 VITALS — BP 139/84 | HR 74 | Temp 98.3°F | Ht 71.0 in | Wt 174.1 lb

## 2024-01-16 DIAGNOSIS — S99921A Unspecified injury of right foot, initial encounter: Secondary | ICD-10-CM

## 2024-01-16 DIAGNOSIS — K219 Gastro-esophageal reflux disease without esophagitis: Secondary | ICD-10-CM

## 2024-01-16 MED ORDER — DICLOFENAC SODIUM 75 MG PO TBEC: 75.0000 mg | DELAYED_RELEASE_TABLET | Freq: Two times a day (BID) | ORAL | 0 refills | Status: AC | PRN

## 2024-01-16 NOTE — Progress Notes (Signed)
 Subjective: CC: Toe injury PCP: [name], DO Seth Mcmillan is a 58 y.o. male presenting to clinic today for:  Patient reports right great toe injury about 6 weeks ago when he kicked a toilet.  He notes he had immediate swelling and tenderness but thought it might get better on its own so did not seek care.  He notes persistent swelling that is equivalent to the day that he injured it.  He reports pain with ambulation on it.  Reports redness but no erythema or other discoloration.  Has been utilizing as needed OTC analgesics without much improvement in symptoms.   ROS: Per HPI  No Known Allergies Past Medical History:  Diagnosis Date   Anxiety    Arthritis    Depression    Diverticulitis    Elevated PSA    Fatty liver    GERD (gastroesophageal reflux disease)    Hyperlipidemia    Hypertension    IBS (irritable bowel syndrome)    Diarrhea type   Pancreatitis    Substance abuse (HCC)    ETOH and THC    Current Outpatient Medications:    amLODipine  (NORVASC ) 10 MG tablet, TAKE 1 TABLET BY MOUTH ONCE DAILY **THIS  IS  A  DOSE  CHANGE**, Disp: 30 tablet, Rfl: 5   diclofenac (VOLTAREN) 75 MG EC tablet, Take 1 tablet (75 mg total) by mouth 2 (two) times daily as needed for moderate pain (pain score 4-6) (NO ibuprofen or aleve  while taking. tylenol  ok.)., Disp: 30 tablet, Rfl: 0   docusate sodium  (COLACE) 100 MG capsule, Take 1 capsule (100 mg total) by mouth 2 (two) times daily., Disp: , Rfl:    escitalopram  (LEXAPRO ) 10 MG tablet, Take 1 tablet (10 mg total) by mouth daily., Disp: 90 tablet, Rfl: 3   ezetimibe  (ZETIA ) 10 MG tablet, Take 1 tablet (10 mg total) by mouth daily., Disp: 90 tablet, Rfl: 3   fenofibrate  (TRICOR ) 145 MG tablet, Take 1 tablet (145 mg total) by mouth daily., Disp: 90 tablet, Rfl: 3   furosemide  (LASIX ) 20 MG tablet, Take 1 tablet (20 mg total) by mouth daily as needed for fluid., Disp: 90 tablet, Rfl: 1   omeprazole  (PRILOSEC) 40 MG capsule,  Take 1 capsule (40 mg total) by mouth daily., Disp: 90 capsule, Rfl: 3   ondansetron  (ZOFRAN ) 4 MG tablet, TAKE 1 TABLET BY MOUTH EVERY 8 HOURS AS NEEDED, Disp: 30 tablet, Rfl: 0   rosuvastatin  (CRESTOR ) 40 MG tablet, Take 1 tablet (40 mg total) by mouth daily., Disp: 90 tablet, Rfl: 3 Social History   Socioeconomic History   Marital status: Divorced    Spouse name: Not on file   Number of children: Not on file   Years of education: Not on file   Highest education level: Some college, no degree  Occupational History   Not on file  Tobacco Use   Smoking status: Former    Current packs/day: 0.00    Average packs/day: 1 pack/day for 25.0 years (25.0 ttl pk-yrs)    Types: Cigarettes    Start date: 07/16/1992    Quit date: 07/16/2017    Years since quitting: 6.5    Passive exposure: Past   Smokeless tobacco: Never   Tobacco comments:    Pt smokes cigar daily   Vaping Use   Vaping status: Never Used  Substance and Sexual Activity   Alcohol use: Yes    Alcohol/week: 18.0 standard drinks of alcohol    Types:  18 Cans of beer per week    Comment: case per week   Drug use: Yes    Types: Marijuana   Sexual activity: Not Currently  Other Topics Concern   Not on file  Social History Narrative   Works at a cigarette factory   Social Drivers of Corporate Investment Banker Strain: Low Risk  (10/26/2023)   Overall Financial Resource Strain (CARDIA)    Difficulty of Paying Living Expenses: Not very hard  Food Insecurity: No Food Insecurity (11/20/2023)   Hunger Vital Sign    Worried About Running Out of Food in the Last Year: Never true    Ran Out of Food in the Last Year: Never true  Transportation Needs: Patient Declined (11/20/2023)   PRAPARE - Transportation    Lack of Transportation (Medical): Patient declined    Lack of Transportation (Non-Medical): Patient declined  Physical Activity: Unknown (10/26/2023)   Exercise Vital Sign    Days of Exercise per Week: Patient declined     Minutes of Exercise per Session: Not on file  Stress: No Stress Concern Present (10/26/2023)   [employer] of Occupational Health - Occupational Stress Questionnaire    Feeling of Stress: Only a little  Social Connections: Moderately Integrated (11/20/2023)   Social Connection and Isolation Panel    Frequency of Communication with Friends and Family: More than three times a week    Frequency of Social Gatherings with Friends and Family: More than three times a week    Attends Religious Services: More than 4 times per year    Active Member of [employer] or Organizations: No    Attends Banker Meetings: Never    Marital Status: Married  Recent Concern: Social Connections - Moderately Isolated (10/26/2023)   Social Connection and Isolation Panel    Frequency of Communication with Friends and Family: More than three times a week    Frequency of Social Gatherings with Friends and Family: Patient declined    Attends Religious Services: Patient declined    Database Administrator or Organizations: No    Attends Engineer, Structural: Not on file    Marital Status: Married  Intimate Partner Violence: Unknown (11/20/2023)   Humiliation, Afraid, Rape, and Kick questionnaire    Fear of Current or Ex-Partner: Patient declined    Emotionally Abused: Patient declined    Physically Abused: Not on file    Sexually Abused: Patient declined   Family History  Problem Relation Age of Onset   Heart disease Mother    Dementia Mother    Heart disease Father    Diabetes Brother    Sleep apnea Nephew    Colon cancer Neg Hx     Objective: Office vital signs reviewed. BP 139/84   Pulse 74   Temp 98.3 F (36.8 C)   Ht 5' 11 (1.803 m)   Wt 174 lb 2 oz (79 kg)   SpO2 96%   BMI 24.29 kg/m   Physical Examination:  General: Awake, alert, well nourished, No acute distress HEENT: sclera white, MMM MSK: Ambulating independently with minimally antalgic gait.  Right great toe is  moderately swollen with reduced active range of motion and tenderness palpation along the distal end of the toe  Assessment/ Plan: 58 y.o. male   Injury of right toe, initial encounter - Plan: DG Toe Great Right, diclofenac (VOLTAREN) 75 MG EC tablet, Ambulatory referral to Orthopedic Surgery   Placed in postop shoe.  Voltaren given for as  needed use.  Avoid other NSAIDs.  Ice the affected area.  Referral to orthopedics and plain x-rays were obtained.  He will get the x-ray done on Monday   Laren Orama M Makaylie Dedeaux, DO [facility] [phone]

## 2024-01-16 NOTE — Patient Instructions (Addendum)
 I suspect you fractured your toe.   Xrays are ordered.  I sent in a pain reliever.  Take with food.  NO ibuprofen or aleve  while taking but Tylenol  ok. Ice the affected area.  Wear that shoe during the daytime to protect/ stabilize the toe. Referral to orthopedics placed.

## 2024-01-19 ENCOUNTER — Ambulatory Visit

## 2024-01-19 DIAGNOSIS — S99921A Unspecified injury of right foot, initial encounter: Secondary | ICD-10-CM

## 2024-01-20 ENCOUNTER — Ambulatory Visit: Payer: Self-pay | Admitting: Family Medicine

## 2024-01-20 ENCOUNTER — Ambulatory Visit

## 2024-01-20 DIAGNOSIS — S99921A Unspecified injury of right foot, initial encounter: Secondary | ICD-10-CM | POA: Diagnosis not present

## 2024-01-26 DIAGNOSIS — Z87891 Personal history of nicotine dependence: Secondary | ICD-10-CM | POA: Insufficient documentation

## 2024-01-29 ENCOUNTER — Ambulatory Visit: Admitting: Orthopedic Surgery

## 2024-01-29 ENCOUNTER — Encounter: Payer: Self-pay | Admitting: Orthopedic Surgery

## 2024-01-29 VITALS — BP 139/84 | Ht 71.0 in | Wt 174.0 lb

## 2024-01-29 DIAGNOSIS — S92414A Nondisplaced fracture of proximal phalanx of right great toe, initial encounter for closed fracture: Secondary | ICD-10-CM | POA: Diagnosis not present

## 2024-01-29 NOTE — Progress Notes (Signed)
  Intake history:  Chief Complaint  Patient presents with   Foot Pain    Right great toe injury      BP 139/84 Comment: 01/16/24  Ht 5' 11 (1.803 m)   Wt 174 lb (78.9 kg)   BMI 24.27 kg/m  Body mass index is 24.27 kg/m.  Pharmacy? __[facility]____________________________________  WHAT ARE WE SEEING YOU FOR TODAY?   Right Great toe fracture   How long has this bothered you? (DOI?DOS?WS?)  on 12/08/23  Was there an injury? Yes  Anticoag.  No   Any ALLERGIES _________No Known Allergies _____________________________________   Treatment:  Have you taken:  Tylenol  No  Advil No  Had PT No  Had injection No  Other  ________________Diclofenac_prn______

## 2024-01-29 NOTE — Progress Notes (Signed)
   Chief Complaint  Patient presents with   Foot Pain    Right great toe injury     -year-old male kicked at toilet back in September injured his great toe presented to his primary care doctor a week ago complaining of pain and swelling in the great toe and the x-rays showed a comminuted fracture of the proximal phalanx of the right great toe  He still has pain and swelling he did wear a hard soled shoe for short time  Foot Pain    PHYSICAL EXAM:   Focused exam shows swelling of the great toe tenderness is unclear whether the fracture site is still moving alignment looks normal Skin is intact No neurovascular compromise  Assessment and Plan:   Encounter Diagnosis  Name Primary?   Nondisplaced fracture of proximal phalanx of right great toe, initial encounter for closed fracture Yes     Subacute fracture right great toe unclear if there is a fibrous union  We discussed treatment options My recommendation was hard sole shoe, versus cam walker, versus as his  Also recommended some Epsom salt soaks to get the swelling out  He opted for an [employer] hard sole carbon fiber insole

## 2024-03-01 ENCOUNTER — Encounter: Payer: Self-pay | Admitting: Family Medicine

## 2024-03-01 ENCOUNTER — Ambulatory Visit: Payer: Self-pay | Admitting: Family Medicine

## 2024-03-01 ENCOUNTER — Ambulatory Visit (INDEPENDENT_AMBULATORY_CARE_PROVIDER_SITE_OTHER)

## 2024-03-01 ENCOUNTER — Ambulatory Visit: Admitting: Family Medicine

## 2024-03-01 VITALS — BP 132/71 | HR 88 | Temp 98.5°F | Ht 71.0 in | Wt 173.5 lb

## 2024-03-01 DIAGNOSIS — F1010 Alcohol abuse, uncomplicated: Secondary | ICD-10-CM | POA: Diagnosis not present

## 2024-03-01 DIAGNOSIS — K760 Fatty (change of) liver, not elsewhere classified: Secondary | ICD-10-CM | POA: Diagnosis not present

## 2024-03-01 DIAGNOSIS — R234 Changes in skin texture: Secondary | ICD-10-CM | POA: Diagnosis not present

## 2024-03-01 DIAGNOSIS — S92414D Nondisplaced fracture of proximal phalanx of right great toe, subsequent encounter for fracture with routine healing: Secondary | ICD-10-CM | POA: Diagnosis not present

## 2024-03-01 DIAGNOSIS — F4322 Adjustment disorder with anxiety: Secondary | ICD-10-CM

## 2024-03-01 MED ORDER — ESCITALOPRAM OXALATE 10 MG PO TABS: 10.0000 mg | ORAL_TABLET | Freq: Every day | ORAL | 3 refills | Status: AC

## 2024-03-01 NOTE — Telephone Encounter (Signed)
 Orders Placed This Encounter  Procedures   Ambulatory referral to Podiatry

## 2024-03-01 NOTE — Progress Notes (Signed)
 Subjective: CC: Liver concerns PCP: Seth [name], DO Seth Mcmillan is a 58 y.o. male presenting to clinic today for:  Patient is accompanied to the office by his wife, whom he is separated from.  He reports ongoing right great toe discomfort.  Has seen orthopedist but no planned interventions at this time.  He feels like the swelling is getting worse and if he is standing on his feet for prolonged amount of time the pain is also worse.  Symptoms continue to be refractory to OTC and prescribed management  His wife reports concerns about his liver.  She notes that he continues to consume quite a large amount of alcohol, 20+ beers per day at times.  He has been doing this since December 08, 2023 specifically as this is the date that his wife separated from him.  He admits to some anxiety and depressive symptoms but reports he discontinued the Lexapro  10 mg because he thought he was doing better after he stopped working.  She voices concern about his liver, citing that he has a history of fatty liver.  He has not followed up with gastroenterology in a while.  He reports no abnormal bleeding but she does report that he appears pale sometimes and does have some lower extremity edema on occasion.  He also reports cracking of the distal aspect of the right ring finger.  There is no preceding injury but he notes that his hands tend to crack quite deeply during the wintertime because they become dry.  He has been applying Neosporin without any improvement in symptoms.  Reports no purulence but of course it hurts when he bends his finger and retracts it.   ROS: Per HPI  Allergies[1] Past Medical History:  Diagnosis Date   Anxiety    Arthritis    Depression    Diverticulitis    Elevated PSA    Fatty liver    GERD (gastroesophageal reflux disease)    Hyperlipidemia    Hypertension    IBS (irritable bowel syndrome)    Diarrhea type   Pancreatitis    Substance abuse (HCC)    ETOH and  THC   Current Medications[2] Social History   Socioeconomic History   Marital status: Divorced    Spouse name: Not on file   Number of children: Not on file   Years of education: Not on file   Highest education level: Some college, no degree  Occupational History   Not on file  Tobacco Use   Smoking status: Former    Current packs/day: 0.00    Average packs/day: 1 pack/day for 25.0 years (25.0 ttl pk-yrs)    Types: Cigarettes    Start date: 07/16/1992    Quit date: 07/16/2017    Years since quitting: 6.6    Passive exposure: Past   Smokeless tobacco: Never   Tobacco comments:    Pt smokes cigar daily   Vaping Use   Vaping status: Never Used  Substance and Sexual Activity   Alcohol use: Yes    Alcohol/week: 18.0 standard drinks of alcohol    Types: 18 Cans of beer per week    Comment: case per week   Drug use: Yes    Types: Marijuana   Sexual activity: Not Currently  Other Topics Concern   Not on file  Social History Narrative   Works at a cigarette factory   Social Drivers of Health   Tobacco Use: Medium Risk (03/01/2024)   Patient History  Smoking Tobacco Use: Former    Smokeless Tobacco Use: Never    Passive Exposure: Past  Physicist, Medical Strain: Low Risk (10/26/2023)   Overall Financial Resource Strain (CARDIA)    Difficulty of Paying Living Expenses: Not very hard  Food Insecurity: No Food Insecurity (11/20/2023)   Epic    Worried About Programme Researcher, Broadcasting/film/video in the Last Year: Never true    Ran Out of Food in the Last Year: Never true  Transportation Needs: Patient Declined (11/20/2023)   Epic    Lack of Transportation (Medical): Patient declined    Lack of Transportation (Non-Medical): Patient declined  Physical Activity: Unknown (10/26/2023)   Exercise Vital Sign    Days of Exercise per Week: Patient declined    Minutes of Exercise per Session: Not on file  Stress: No Stress Concern Present (10/26/2023)   [employer] of Occupational Health -  Occupational Stress Questionnaire    Feeling of Stress: Only a little  Social Connections: Moderately Integrated (11/20/2023)   Social Connection and Isolation Panel    Frequency of Communication with Friends and Family: More than three times a week    Frequency of Social Gatherings with Friends and Family: More than three times a week    Attends Religious Services: More than 4 times per year    Active Member of [employer] or Organizations: No    Attends Banker Meetings: Never    Marital Status: Married  Recent Concern: Social Connections - Moderately Isolated (10/26/2023)   Social Connection and Isolation Panel    Frequency of Communication with Friends and Family: More than three times a week    Frequency of Social Gatherings with Friends and Family: Patient declined    Attends Religious Services: Patient declined    Active Member of Clubs or Organizations: No    Attends Engineer, Structural: Not on file    Marital Status: Married  Intimate Partner Violence: Unknown (11/20/2023)   Epic    Fear of Current or Ex-Partner: Patient declined    Emotionally Abused: Patient declined    Physically Abused: Not on file    Sexually Abused: Patient declined  Depression (PHQ2-9): High Risk (03/01/2024)   Depression (PHQ2-9)    PHQ-2 Score: 21  Alcohol Screen: Medium Risk (10/26/2023)   Alcohol Screen    Last Alcohol Screening Score (AUDIT): 10  Housing: Patient Declined (11/20/2023)   Epic    Unable to Pay for Housing in the Last Year: Patient declined    Number of Times Moved in the Last Year: Not on file    Homeless in the Last Year: Patient declined  Utilities: Not At Risk (11/20/2023)   Epic    Threatened with loss of utilities: No  Health Literacy: Not on file   Family History  Problem Relation Age of Onset   Heart disease Mother    Dementia Mother    Heart disease Father    Diabetes Brother    Sleep apnea Nephew    Colon cancer Neg Hx     Objective: Office vital  signs reviewed. BP 132/71   Pulse 88   Temp 98.5 F (36.9 C)   Ht 5' 11 (1.803 m)   Wt 173 lb 8 oz (78.7 kg)   SpO2 97%   BMI 24.20 kg/m   Physical Examination:  General: Awake, alert, nontoxic male, No acute distress HEENT: Sclera white.  No conjunctival pallor appreciated. Cardio: regular rate and rhythm, S1S2 heard, no murmurs appreciated Pulm: clear  to auscultation bilaterally, no wheezes, rhonchi or rales; normal work of breathing on room air Skin: Deep fissure of the skin appreciated at the dorsal aspect of the right DIP of the ring finger.  No purulence. MSK: Ambulating independently with minimally antalgic gait Psych: Mood stable, speech normal.  Affect appropriate.  Does not appear to be responding to internal stimuli     03/01/2024    9:58 AM 01/16/2024   10:17 AM 10/27/2023   11:54 AM  Depression screen PHQ 2/9  Decreased Interest 2 0 0  Down, Depressed, Hopeless 3 0 0  PHQ - 2 Score 5 0 0  Altered sleeping 2 0 0  Tired, decreased energy 3 0 0  Change in appetite 2 0 0  Feeling bad or failure about yourself  3 0 0  Trouble concentrating 3 0 0  Moving slowly or fidgety/restless 1 0 0  Suicidal thoughts 2 0 0  PHQ-9 Score 21 0  0   Difficult doing work/chores Very difficult Not difficult at all Not difficult at all     Data saved with a previous flowsheet row definition      03/01/2024    9:58 AM 01/16/2024   10:17 AM 10/27/2023   11:54 AM 12/31/2022    1:44 PM  GAD 7 : Generalized Anxiety Score  Nervous, Anxious, on Edge 3 0 0 0  Control/stop worrying 3 0 0 0  Worry too much - different things 3 0 0 0  Trouble relaxing 3 0 0 0  Restless 2 0 0 0  Easily annoyed or irritable 3 0 0 0  Afraid - awful might happen 1 0 0 0  Total GAD 7 Score 18 0 0 0  Anxiety Difficulty Very difficult Not difficult at all Not difficult at all Not difficult at all   DG Toe Great Right Result Date: 03/01/2024 EXAM: 3 VIEW(S) XRAY OF THE TOES 03/01/2024 03:27:37 PM  COMPARISON: None available. CLINICAL HISTORY: h/o fracture. ongoing swelling FINDINGS: BONES AND JOINTS: Subacute appearing fracture of the midshaft of the first proximal phalanx. Incomplete bony bridging. Callus formation has increased from prior. Joint spaces are well maintained. No malalignment. SOFT TISSUES: Surrounding soft tissue swelling has increased. IMPRESSION: 1. Subacute fracture of the midshaft of the first proximal phalanx with incomplete bony bridging and increased callus formation. 2. Increased surrounding soft tissue swelling. Electronically signed by: Greig Pique MD 03/01/2024 03:33 PM EST RP Workstation: [id number]   Assessment/ Plan: 58 y.o. male   Chronic alcohol abuse - Plan: Hepatic Function Panel, Folate, Vitamin B1, CBC with Differential, US  ABDOMEN RUQ W/ELASTOGRAPHY  Fatty liver - Plan: US  ABDOMEN RUQ W/ELASTOGRAPHY  Adjustment disorder with anxious mood - Plan: escitalopram  (LEXAPRO ) 10 MG tablet  Closed nondisplaced fracture of proximal phalanx of right great toe with routine healing, subsequent encounter - Plan: DG Toe Great Right  Cracked skin   Check labs.  I will order right upper quadrant ultrasound with elastography.  Advised him to follow-up with gastroenterology.  I will CC this chart to them as well.  We discussed the risk of progression to fibrosis and cirrhosis given alcohol use and history of fatty liver.  I encouraged him to gradually stop drinking alcohol.  I offered pharmacologic assistance for detox but he did not seem overly interested in this today.  I would like him to restart his Lexapro .  I am going to see him back again in the next 3 to 4 weeks for recheck  Repeat digital x-ray  of the right toe shows ongoing unhealed fracture with increased swelling.  I have CCed his orthopedist to see if there are any other recommendations.  For the cracked skin at the distal aspect of the right ring finger, this was dressed today with Xeroform and I advised  him to continue these dressings daily for the next week.  Advised to keep skin moist and watch for signs and symptoms of infection.   Norene CHRISTELLA Fielding, DO [facility] [phone]     [1] No Known Allergies [2]  Current Outpatient Medications:    amLODipine  (NORVASC ) 10 MG tablet, TAKE 1 TABLET BY MOUTH ONCE DAILY **THIS  IS  A  DOSE  CHANGE**, Disp: 30 tablet, Rfl: 5   diclofenac  (VOLTAREN ) 75 MG EC tablet, Take 1 tablet (75 mg total) by mouth 2 (two) times daily as needed for moderate pain (pain score 4-6) (NO ibuprofen or aleve  while taking. tylenol  ok.)., Disp: 30 tablet, Rfl: 0   ezetimibe  (ZETIA ) 10 MG tablet, Take 1 tablet (10 mg total) by mouth daily., Disp: 90 tablet, Rfl: 3   fenofibrate  (TRICOR ) 145 MG tablet, Take 1 tablet (145 mg total) by mouth daily., Disp: 90 tablet, Rfl: 3   furosemide  (LASIX ) 20 MG tablet, Take 1 tablet (20 mg total) by mouth daily as needed for fluid., Disp: 90 tablet, Rfl: 1   omeprazole  (PRILOSEC) 40 MG capsule, Take 1 capsule by mouth once daily, Disp: 90 capsule, Rfl: 1   ondansetron  (ZOFRAN ) 4 MG tablet, TAKE 1 TABLET BY MOUTH EVERY 8 HOURS AS NEEDED, Disp: 30 tablet, Rfl: 0   rosuvastatin  (CRESTOR ) 40 MG tablet, Take 1 tablet (40 mg total) by mouth daily., Disp: 90 tablet, Rfl: 3   docusate sodium  (COLACE) 100 MG capsule, Take 1 capsule (100 mg total) by mouth 2 (two) times daily. (Patient not taking: Reported on 03/01/2024), Disp: , Rfl:    escitalopram  (LEXAPRO ) 10 MG tablet, Take 1 tablet (10 mg total) by mouth daily. (Patient not taking: Reported on 03/01/2024), Disp: 90 tablet, Rfl: 3

## 2024-03-02 ENCOUNTER — Encounter: Payer: Self-pay | Admitting: Gastroenterology

## 2024-03-02 ENCOUNTER — Ambulatory Visit: Admitting: Gastroenterology

## 2024-03-02 VITALS — BP 141/82 | HR 75 | Temp 97.6°F | Ht 71.0 in | Wt 177.2 lb

## 2024-03-02 DIAGNOSIS — R634 Abnormal weight loss: Secondary | ICD-10-CM

## 2024-03-02 DIAGNOSIS — K219 Gastro-esophageal reflux disease without esophagitis: Secondary | ICD-10-CM | POA: Diagnosis not present

## 2024-03-02 DIAGNOSIS — R630 Anorexia: Secondary | ICD-10-CM | POA: Diagnosis not present

## 2024-03-02 DIAGNOSIS — F1010 Alcohol abuse, uncomplicated: Secondary | ICD-10-CM

## 2024-03-02 DIAGNOSIS — F1090 Alcohol use, unspecified, uncomplicated: Secondary | ICD-10-CM | POA: Diagnosis not present

## 2024-03-02 DIAGNOSIS — R7989 Other specified abnormal findings of blood chemistry: Secondary | ICD-10-CM

## 2024-03-02 DIAGNOSIS — R6881 Early satiety: Secondary | ICD-10-CM | POA: Diagnosis not present

## 2024-03-02 DIAGNOSIS — R110 Nausea: Secondary | ICD-10-CM

## 2024-03-02 DIAGNOSIS — K760 Fatty (change of) liver, not elsewhere classified: Secondary | ICD-10-CM | POA: Diagnosis not present

## 2024-03-02 NOTE — Patient Instructions (Signed)
 Upper endoscopy in the near future.  Start weaning off alcohol. Please discuss medications to help with withdrawal with Dr. Jolinda.   Continue omeprazole  40mg  daily.

## 2024-03-02 NOTE — H&P (View-Only) (Signed)
 GI Office Note    Referring Provider: [name], DO Primary Care Physician:  [name], DO  Primary Gastroenterologist: Carlin POUR. Cindie, DO   Chief Complaint   Chief Complaint  Patient presents with   Nausea    Having issues with nausea and no appetite. Dr. Jolinda is wanting EGD done due to pt's alcohol use and elevated liver enzymes.    History of Present Illness   Seth Mcmillan is a 58 y.o. male presenting today for urgent ov at the request of Dr. Jolinda.  Discussed the use of AI scribe software for clinical note transcription with the patient, who gave verbal consent to proceed.  History of Present Illness Seth Mcmillan is a 58 year old male who presents with gastrointestinal symptoms and recent significant weight loss. He was last seen in 04/2023 for IBS-D, GERD, fatty liver.   He has had several months of nausea and poor appetite. He feels hungry but loses the desire to eat and develops nausea and early satiety when he tries to eat. He denies vomiting. He notes occasional black stools but no visible red blood. He has been taking omeprazole .  He has lost about 30 pounds since September, from about 199 pounds in July to 167 pounds now.  He has been drinking heavily, up to 18 beers per day during recent stress, and is trying to cut back. He notes increased etoh use over the past 3 months. He does have history of significant etoh use in the past but had cut back until the past several months. When I saw him in 04/2023 he reported drinking 1-2 beers daily. He notes he has had some withdrawal symptoms recently he he tries to reduce etoh. He develops shaking when he reduces alcohol, which improves after drinking.   He is scheduled for an abdominal ultrasound with elastography ordered by his PCP. He has used Pepto Bismol intermittently but not in the past several days. He was prescribed diclofenac  for a toe injury previously but nor longer is not taking  it.  He has lost 30 pounds in the past several months.   Since I last saw him he completed prostatectomy for prostate cancer back in 11/2023.    Prior Data     Results    Lab Results  Component Value Date   WBC 6.2 03/01/2024   HGB 14.1 03/01/2024   HCT 41.4 03/01/2024   MCV 96 03/01/2024   PLT 284 03/01/2024   Lab Results  Component Value Date   FOLATE 15.0 03/01/2024   Lab Results  Component Value Date   ALT 87 (H) 03/01/2024   AST 80 (H) 03/01/2024   ALKPHOS 62 03/01/2024   BILITOT 0.3 03/01/2024      Latest Ref Rng & Units 03/01/2024   10:42 AM 11/10/2023    8:52 AM 10/27/2023   12:01 PM  Hepatic Function  Total Protein 6.0 - 8.5 g/dL 6.6  7.1  6.6   Albumin 3.8 - 4.9 g/dL 4.2  3.8  4.4   AST 0 - 40 IU/L 80  30  30   ALT 0 - 44 IU/L 87  28  30   Alk Phosphatase 47 - 123 IU/L 62  55  74   Total Bilirubin 0.0 - 1.2 mg/dL 0.3  0.8  0.3   Bilirubin, Direct 0.00 - 0.40 mg/dL 9.83   9.87    Lab Results  Component Value Date   VITAMINB12 532 06/04/2022  Lab Results  Component Value Date   FOLATE 15.0 03/01/2024   Colonoscopy 05/25/2019 unremarkable with a 5-year recall due to surveillance. EGD at the same time showed mild gastritis, biopsies negative for H. pylori. Did have a Schatzki's ring that was not manipulated. In 2017, random colon biopsies negative.celiac screen negative.   Medications   Current Outpatient Medications  Medication Sig Dispense Refill   amLODipine  (NORVASC ) 10 MG tablet TAKE 1 TABLET BY MOUTH ONCE DAILY **THIS  IS  A  DOSE  CHANGE** 30 tablet 5   diclofenac  (VOLTAREN ) 75 MG EC tablet Take 1 tablet (75 mg total) by mouth 2 (two) times daily as needed for moderate pain (pain score 4-6) (NO ibuprofen or aleve  while taking. tylenol  ok.). 30 tablet 0   escitalopram  (LEXAPRO ) 10 MG tablet Take 1 tablet (10 mg total) by mouth daily. 90 tablet 3   ezetimibe  (ZETIA ) 10 MG tablet Take 1 tablet (10 mg total) by mouth daily. 90 tablet 3    fenofibrate  (TRICOR ) 145 MG tablet Take 1 tablet (145 mg total) by mouth daily. 90 tablet 3   furosemide  (LASIX ) 20 MG tablet Take 1 tablet (20 mg total) by mouth daily as needed for fluid. 90 tablet 1   omeprazole  (PRILOSEC) 40 MG capsule Take 1 capsule by mouth once daily 90 capsule 1   ondansetron  (ZOFRAN ) 4 MG tablet TAKE 1 TABLET BY MOUTH EVERY 8 HOURS AS NEEDED 30 tablet 0   rosuvastatin  (CRESTOR ) 40 MG tablet Take 1 tablet (40 mg total) by mouth daily. 90 tablet 3   No current facility-administered medications for this visit.    Allergies   Allergies as of 03/02/2024   (No Known Allergies)     Past Medical History   Past Medical History:  Diagnosis Date   Anxiety    Arthritis    Depression    Diverticulitis    Elevated PSA    Fatty liver    GERD (gastroesophageal reflux disease)    Hyperlipidemia    Hypertension    IBS (irritable bowel syndrome)    Diarrhea type   Pancreatitis    Substance abuse (HCC)    ETOH and THC    Past Surgical History   Past Surgical History:  Procedure Laterality Date   ANTERIOR CERVICAL DECOMP/DISCECTOMY FUSION N/A 05/18/2018   Procedure: C6-7. C7-T1 ANTERIOR CERVICAL DECOMPRESSION/DISCECTOMY FUSION, ALLOGRAFT, PLATE;  Surgeon: Barbarann Oneil BROCKS, MD;  Location: [facility];  Service: Orthopedics;  Laterality: N/A;   BIOPSY  09/05/2015   Procedure: BIOPSY;  Surgeon: Margo LITTIE Haddock, MD;  Location: [facility];  Service: Endoscopy;;  random colon bx   BIOPSY  05/25/2019   Procedure: BIOPSY;  Surgeon: Haddock Margo LITTIE, MD;  Location: [facility];  Service: Endoscopy;;   COLONOSCOPY N/A 09/05/2015   Dr. haddock: 4 simple adenomas removed.  Next colonoscopy 3 years.   COLONOSCOPY WITH PROPOFOL  N/A 05/25/2019   Procedure: COLONOSCOPY WITH PROPOFOL ;  Surgeon: Haddock Margo LITTIE, MD;  Location: [facility];  Service: Endoscopy;  Laterality: N/A;  12:15pm   ESOPHAGOGASTRODUODENOSCOPY (EGD) WITH PROPOFOL  N/A 05/25/2019   Procedure:  ESOPHAGOGASTRODUODENOSCOPY (EGD) WITH PROPOFOL ;  Surgeon: Haddock Margo LITTIE, MD;  Location: [facility];  Service: Endoscopy;  Laterality: N/A;   KNEE SURGERY Right 1997   bursa sac   POLYPECTOMY  09/05/2015   Procedure: POLYPECTOMY;  Surgeon: Margo LITTIE Haddock, MD;  Location: [facility];  Service: Endoscopy;;  colon polyps   PROSTATE BIOPSY  ROBOT ASSISTED LAPAROSCOPIC RADICAL PROSTATECTOMY N/A 11/20/2023   Procedure: PROSTATECTOMY, RADICAL, ROBOT-ASSISTED, LAPAROSCOPIC;  Surgeon: Renda Glance, MD;  Location: [facility];  Service: Urology;  Laterality: N/A;  LEVEL 2   SHOULDER ARTHROSCOPY WITH BICEPS TENDON REPAIR Right 2011   TONSILECTOMY, ADENOIDECTOMY, BILATERAL MYRINGOTOMY AND TUBES      Past Family History   Family History  Problem Relation Age of Onset   Heart disease Mother    Dementia Mother    Heart disease Father    Diabetes Brother    Sleep apnea Nephew    Colon cancer Neg Hx     Past Social History   Social History   Socioeconomic History   Marital status: Divorced    Spouse name: Not on file   Number of children: Not on file   Years of education: Not on file   Highest education level: Some college, no degree  Occupational History   Not on file  Tobacco Use   Smoking status: Former    Current packs/day: 0.00    Average packs/day: 1 pack/day for 25.0 years (25.0 ttl pk-yrs)    Types: Cigarettes    Start date: 07/16/1992    Quit date: 07/16/2017    Years since quitting: 6.6    Passive exposure: Past   Smokeless tobacco: Never   Tobacco comments:    Pt smokes cigar daily   Vaping Use   Vaping status: Never Used  Substance and Sexual Activity   Alcohol use: Yes    Alcohol/week: 18.0 standard drinks of alcohol    Types: 18 Cans of beer per week    Comment: case per week   Drug use: Yes    Types: Marijuana   Sexual activity: Not Currently  Other Topics Concern   Not on file  Social History Narrative   Works at a cigarette factory   Social Drivers of  Health   Tobacco Use: Medium Risk (03/02/2024)   Patient History    Smoking Tobacco Use: Former    Smokeless Tobacco Use: Never    Passive Exposure: Past  Physicist, Medical Strain: Low Risk (10/26/2023)   Overall Financial Resource Strain (CARDIA)    Difficulty of Paying Living Expenses: Not very hard  Food Insecurity: No Food Insecurity (11/20/2023)   Epic    Worried About Programme Researcher, Broadcasting/film/video in the Last Year: Never true    Ran Out of Food in the Last Year: Never true  Transportation Needs: Patient Declined (11/20/2023)   Epic    Lack of Transportation (Medical): Patient declined    Lack of Transportation (Non-Medical): Patient declined  Physical Activity: Unknown (10/26/2023)   Exercise Vital Sign    Days of Exercise per Week: Patient declined    Minutes of Exercise per Session: Not on file  Stress: No Stress Concern Present (10/26/2023)   [employer] of Occupational Health - Occupational Stress Questionnaire    Feeling of Stress: Only a little  Social Connections: Moderately Integrated (11/20/2023)   Social Connection and Isolation Panel    Frequency of Communication with Friends and Family: More than three times a week    Frequency of Social Gatherings with Friends and Family: More than three times a week    Attends Religious Services: More than 4 times per year    Active Member of [employer] or Organizations: No    Attends Banker Meetings: Never    Marital Status: Married  Recent Concern: Social Connections - Moderately Isolated (10/26/2023)   Social Connection  and Isolation Panel    Frequency of Communication with Friends and Family: More than three times a week    Frequency of Social Gatherings with Friends and Family: Patient declined    Attends Religious Services: Patient declined    Active Member of Clubs or Organizations: No    Attends Engineer, Structural: Not on file    Marital Status: Married  Intimate Partner Violence: Unknown (11/20/2023)   Epic     Fear of Current or Ex-Partner: Patient declined    Emotionally Abused: Patient declined    Physically Abused: Not on file    Sexually Abused: Patient declined  Depression (PHQ2-9): High Risk (03/01/2024)   Depression (PHQ2-9)    PHQ-2 Score: 21  Alcohol Screen: Medium Risk (10/26/2023)   Alcohol Screen    Last Alcohol Screening Score (AUDIT): 10  Housing: Patient Declined (11/20/2023)   Epic    Unable to Pay for Housing in the Last Year: Patient declined    Number of Times Moved in the Last Year: Not on file    Homeless in the Last Year: Patient declined  Utilities: Not At Risk (11/20/2023)   Epic    Threatened with loss of utilities: No  Health Literacy: Not on file    Review of Systems   General: Negative for   fever, chills, fatigue, weakness. See hpi ENT: Negative for hoarseness, difficulty swallowing , nasal congestion. CV: Negative for chest pain, angina, palpitations, dyspnea on exertion, peripheral edema.  Respiratory: Negative for dyspnea at rest, dyspnea on exertion, cough, sputum, wheezing.  GI: See history of present illness. GU:  Negative for dysuria, hematuria, urinary incontinence, urinary frequency, nocturnal urination.  Endo: Negative for unusual weight change. See hpi    Physical Exam   BP (!) 141/82   Pulse 75   Temp 97.6 F (36.4 C) (Oral)   Ht 5' 11 (1.803 m)   Wt 177 lb 3.2 oz (80.4 kg)   SpO2 98%   BMI 24.71 kg/m    General: Well-nourished, well-developed in no acute distress. Accompanied by granddaughter Eyes: No icterus. Mouth: Oropharyngeal mucosa moist and pink   Lungs: Clear to auscultation bilaterally.  Heart: Regular rate and rhythm, no murmurs rubs or gallops.  Abdomen: Bowel sounds are normal, nontender, nondistended, no hepatosplenomegaly or masses,  no abdominal bruits or hernia , no rebound or guarding.  Rectal: not performed Extremities: No lower extremity edema. No clubbing or deformities. Neuro: Alert and oriented x 4   Skin:  Warm and dry, no jaundice.   Psych: Alert and cooperative, normal mood and affect.  Labs   Lab Results  Component Value Date   ALT 87 (H) 03/01/2024   AST 80 (H) 03/01/2024   ALKPHOS 62 03/01/2024   BILITOT 0.3 03/01/2024   Lab Results  Component Value Date   NA 135 11/10/2023   CL 103 11/10/2023   K 3.9 11/10/2023   CO2 22 11/10/2023   BUN 10 11/10/2023   CREATININE 0.84 11/10/2023   GFRNONAA >60 11/10/2023   CALCIUM  8.8 (L) 11/10/2023   ALBUMIN 4.2 03/01/2024   GLUCOSE 83 11/10/2023   Lab Results  Component Value Date   WBC 6.2 03/01/2024   HGB 14.1 03/01/2024   HCT 41.4 03/01/2024   MCV 96 03/01/2024   PLT 284 03/01/2024      Imaging Studies   DG Toe Great Right Result Date: 03/01/2024 EXAM: 3 VIEW(S) XRAY OF THE TOES 03/01/2024 03:27:37 PM COMPARISON: None available. CLINICAL HISTORY: h/o fracture.  ongoing swelling FINDINGS: BONES AND JOINTS: Subacute appearing fracture of the midshaft of the first proximal phalanx. Incomplete bony bridging. Callus formation has increased from prior. Joint spaces are well maintained. No malalignment. SOFT TISSUES: Surrounding soft tissue swelling has increased. IMPRESSION: 1. Subacute fracture of the midshaft of the first proximal phalanx with incomplete bony bridging and increased callus formation. 2. Increased surrounding soft tissue swelling. Electronically signed by: Greig Pique MD 03/01/2024 03:33 PM EST RP Workstation: [id number]    Assessment/Plan:    Assessment & Plan Elevated LFTs in the setting of significant etoh use/fatty liver. Not typical pattern of etoh hepatitis therefore may be multifactorial. It will be hard to sort out in the middle of ongoing significant etoh use.   - Continue gradual reduction of alcohol intake.  - Discussed potential medications for withdrawal management with PCP. - trend LFTs. If significant increase, we will move forward with additional serologies.  -will follow up U/S with elastography.    Unintentional weight loss with anorexia, early satiety, and nausea Likely multifactorial in setting of stressors (separation from wife), significant etoh abuse, nsaid use (currently denies but used recently for toe injury). Need to consider gastritis/ulcers.  -EGD with Dr. Cindie. ASA 3, Rm 1,2 ok.  I have discussed the risks, alternatives, benefits with regards to but not limited to the risk of reaction to medication, bleeding, infection, perforation and the patient is agreeable to proceed. Written consent to be obtained.  Gastroesophageal reflux disease GERD symptoms well-controlled with medication. - Continue GERD management with omeprazole  40mg  daily.     Seth Mcmillan. Ezzard, MHS, PA-C [facility]

## 2024-03-02 NOTE — Progress Notes (Unsigned)
 GI Office Note    Referring Provider: [name], DO Primary Care Physician:  [name], DO  Primary Gastroenterologist:  Chief Complaint   Chief Complaint  Patient presents with   Nausea    Having issues with nausea and no appetite. Dr. Jolinda is wanting EGD done due to pt's alcohol use and elevated liver enzymes.    History of Present Illness   Seth Mcmillan is a 58 y.o. male presenting today    Discussed the use of AI scribe software for clinical note transcription with the patient, who gave verbal consent to proceed.  History of Present Illness Seth Mcmillan is a 58 year old male who presents with gastrointestinal symptoms and recent significant weight loss.  He has had several months of nausea and poor appetite. He feels hungry but loses the desire to eat and develops nausea after eating. He denies vomiting. He notes occasional black stools but no visible red blood. He has been taking omeprazole .  He has lost about 30 pounds since September, from about 199 pounds in July to 167 pounds now.  He has been drinking heavily, up to 18 drinks per day during recent stress, and is trying to cut back. He develops shaking when he reduces alcohol, which improves after drinking.  His past medical history includes irritable bowel syndrome that has been improving. He had an upper endoscopy and colonoscopy 4 to 5 years ago.  He is scheduled for an abdominal ultrasound. He has used Pepto Bismol intermittently but not in the past several days. He was prescribed diclofenac  for a toe injury but is not taking it.      Wt Readings from Last 3 Encounters:  03/02/24 177 lb 3.2 oz (80.4 kg)  03/01/24 173 lb 8 oz (78.7 kg)  01/29/24 174 lb (78.9 kg)     Prior Data     Results Labs Liver chemistries (03/01/2024): Elevated; normal on 10/2023  Radiology Abdominal imaging (2021 or 2022): Hepatic steatosis; no evidence of advanced cirrhosis  Lab Results   Component Value Date   WBC 6.2 03/01/2024   HGB 14.1 03/01/2024   HCT 41.4 03/01/2024   MCV 96 03/01/2024   PLT 284 03/01/2024   Lab Results  Component Value Date   FOLATE 15.0 03/01/2024   Lab Results  Component Value Date   ALT 87 (H) 03/01/2024   AST 80 (H) 03/01/2024   ALKPHOS 62 03/01/2024   BILITOT 0.3 03/01/2024      Latest Ref Rng & Units 03/01/2024   10:42 AM 11/10/2023    8:52 AM 10/27/2023   12:01 PM  Hepatic Function  Total Protein 6.0 - 8.5 g/dL 6.6  7.1  6.6   Albumin 3.8 - 4.9 g/dL 4.2  3.8  4.4   AST 0 - 40 IU/L 80  30  30   ALT 0 - 44 IU/L 87  28  30   Alk Phosphatase 47 - 123 IU/L 62  55  74   Total Bilirubin 0.0 - 1.2 mg/dL 0.3  0.8  0.3   Bilirubin, Direct 0.00 - 0.40 mg/dL 9.83   9.87    Lab Results  Component Value Date   VITAMINB12 532 06/04/2022   Lab Results  Component Value Date   FOLATE 15.0 03/01/2024       Colonoscopy 05/25/2019 unremarkable with a 5-year recall due to surveillance. EGD at the same time showed mild gastritis, biopsies negative for H. pylori. Did have a  Schatzki's ring that was not manipulated. In 2017, random colon biopsies negative.celiac screen negative.   Medications   Current Outpatient Medications  Medication Sig Dispense Refill   amLODipine  (NORVASC ) 10 MG tablet TAKE 1 TABLET BY MOUTH ONCE DAILY **THIS  IS  A  DOSE  CHANGE** 30 tablet 5   diclofenac  (VOLTAREN ) 75 MG EC tablet Take 1 tablet (75 mg total) by mouth 2 (two) times daily as needed for moderate pain (pain score 4-6) (NO ibuprofen or aleve  while taking. tylenol  ok.). 30 tablet 0   escitalopram  (LEXAPRO ) 10 MG tablet Take 1 tablet (10 mg total) by mouth daily. 90 tablet 3   ezetimibe  (ZETIA ) 10 MG tablet Take 1 tablet (10 mg total) by mouth daily. 90 tablet 3   fenofibrate  (TRICOR ) 145 MG tablet Take 1 tablet (145 mg total) by mouth daily. 90 tablet 3   furosemide  (LASIX ) 20 MG tablet Take 1 tablet (20 mg total) by mouth daily as needed for fluid. 90  tablet 1   omeprazole  (PRILOSEC) 40 MG capsule Take 1 capsule by mouth once daily 90 capsule 1   ondansetron  (ZOFRAN ) 4 MG tablet TAKE 1 TABLET BY MOUTH EVERY 8 HOURS AS NEEDED 30 tablet 0   rosuvastatin  (CRESTOR ) 40 MG tablet Take 1 tablet (40 mg total) by mouth daily. 90 tablet 3   No current facility-administered medications for this visit.    Allergies   Allergies as of 03/02/2024   (No Known Allergies)     Past Medical History   Past Medical History:  Diagnosis Date   Anxiety    Arthritis    Depression    Diverticulitis    Elevated PSA    Fatty liver    GERD (gastroesophageal reflux disease)    Hyperlipidemia    Hypertension    IBS (irritable bowel syndrome)    Diarrhea type   Pancreatitis    Substance abuse (HCC)    ETOH and THC    Past Surgical History   Past Surgical History:  Procedure Laterality Date   ANTERIOR CERVICAL DECOMP/DISCECTOMY FUSION N/A 05/18/2018   Procedure: C6-7. C7-T1 ANTERIOR CERVICAL DECOMPRESSION/DISCECTOMY FUSION, ALLOGRAFT, PLATE;  Surgeon: Barbarann Oneil BROCKS, MD;  Location: [facility];  Service: Orthopedics;  Laterality: N/A;   BIOPSY  09/05/2015   Procedure: BIOPSY;  Surgeon: Margo LITTIE Haddock, MD;  Location: [facility];  Service: Endoscopy;;  random colon bx   BIOPSY  05/25/2019   Procedure: BIOPSY;  Surgeon: Haddock Margo LITTIE, MD;  Location: [facility];  Service: Endoscopy;;   COLONOSCOPY N/A 09/05/2015   Dr. haddock: 4 simple adenomas removed.  Next colonoscopy 3 years.   COLONOSCOPY WITH PROPOFOL  N/A 05/25/2019   Procedure: COLONOSCOPY WITH PROPOFOL ;  Surgeon: Haddock Margo LITTIE, MD;  Location: [facility];  Service: Endoscopy;  Laterality: N/A;  12:15pm   ESOPHAGOGASTRODUODENOSCOPY (EGD) WITH PROPOFOL  N/A 05/25/2019   Procedure: ESOPHAGOGASTRODUODENOSCOPY (EGD) WITH PROPOFOL ;  Surgeon: Haddock Margo LITTIE, MD;  Location: [facility];  Service: Endoscopy;  Laterality: N/A;   KNEE SURGERY Right 1997   bursa sac   POLYPECTOMY  09/05/2015    Procedure: POLYPECTOMY;  Surgeon: Margo LITTIE Haddock, MD;  Location: [facility];  Service: Endoscopy;;  colon polyps   PROSTATE BIOPSY     ROBOT ASSISTED LAPAROSCOPIC RADICAL PROSTATECTOMY N/A 11/20/2023   Procedure: PROSTATECTOMY, RADICAL, ROBOT-ASSISTED, LAPAROSCOPIC;  Surgeon: Renda Glance, MD;  Location: [facility];  Service: Urology;  Laterality: N/A;  LEVEL 2   SHOULDER ARTHROSCOPY WITH BICEPS  TENDON REPAIR Right 2011   TONSILECTOMY, ADENOIDECTOMY, BILATERAL MYRINGOTOMY AND TUBES      Past Family History   Family History  Problem Relation Age of Onset   Heart disease Mother    Dementia Mother    Heart disease Father    Diabetes Brother    Sleep apnea Nephew    Colon cancer Neg Hx     Past Social History   Social History   Socioeconomic History   Marital status: Divorced    Spouse name: Not on file   Number of children: Not on file   Years of education: Not on file   Highest education level: Some college, no degree  Occupational History   Not on file  Tobacco Use   Smoking status: Former    Current packs/day: 0.00    Average packs/day: 1 pack/day for 25.0 years (25.0 ttl pk-yrs)    Types: Cigarettes    Start date: 07/16/1992    Quit date: 07/16/2017    Years since quitting: 6.6    Passive exposure: Past   Smokeless tobacco: Never   Tobacco comments:    Pt smokes cigar daily   Vaping Use   Vaping status: Never Used  Substance and Sexual Activity   Alcohol use: Yes    Alcohol/week: 18.0 standard drinks of alcohol    Types: 18 Cans of beer per week    Comment: case per week   Drug use: Yes    Types: Marijuana   Sexual activity: Not Currently  Other Topics Concern   Not on file  Social History Narrative   Works at a cigarette factory   Social Drivers of Health   Tobacco Use: Medium Risk (03/02/2024)   Patient History    Smoking Tobacco Use: Former    Smokeless Tobacco Use: Never    Passive Exposure: Past  Physicist, Medical Strain: Low Risk (10/26/2023)    Overall Financial Resource Strain (CARDIA)    Difficulty of Paying Living Expenses: Not very hard  Food Insecurity: No Food Insecurity (11/20/2023)   Epic    Worried About Programme Researcher, Broadcasting/film/video in the Last Year: Never true    Ran Out of Food in the Last Year: Never true  Transportation Needs: Patient Declined (11/20/2023)   Epic    Lack of Transportation (Medical): Patient declined    Lack of Transportation (Non-Medical): Patient declined  Physical Activity: Unknown (10/26/2023)   Exercise Vital Sign    Days of Exercise per Week: Patient declined    Minutes of Exercise per Session: Not on file  Stress: No Stress Concern Present (10/26/2023)   [employer] of Occupational Health - Occupational Stress Questionnaire    Feeling of Stress: Only a little  Social Connections: Moderately Integrated (11/20/2023)   Social Connection and Isolation Panel    Frequency of Communication with Friends and Family: More than three times a week    Frequency of Social Gatherings with Friends and Family: More than three times a week    Attends Religious Services: More than 4 times per year    Active Member of [employer] or Organizations: No    Attends Banker Meetings: Never    Marital Status: Married  Recent Concern: Social Connections - Moderately Isolated (10/26/2023)   Social Connection and Isolation Panel    Frequency of Communication with Friends and Family: More than three times a week    Frequency of Social Gatherings with Friends and Family: Patient declined    Attends Religious Services:  Patient declined    Active Member of Clubs or Organizations: No    Attends Banker Meetings: Not on file    Marital Status: Married  Intimate Partner Violence: Unknown (11/20/2023)   Epic    Fear of Current or Ex-Partner: Patient declined    Emotionally Abused: Patient declined    Physically Abused: Not on file    Sexually Abused: Patient declined  Depression (PHQ2-9): High Risk (03/01/2024)    Depression (PHQ2-9)    PHQ-2 Score: 21  Alcohol Screen: Medium Risk (10/26/2023)   Alcohol Screen    Last Alcohol Screening Score (AUDIT): 10  Housing: Patient Declined (11/20/2023)   Epic    Unable to Pay for Housing in the Last Year: Patient declined    Number of Times Moved in the Last Year: Not on file    Homeless in the Last Year: Patient declined  Utilities: Not At Risk (11/20/2023)   Epic    Threatened with loss of utilities: No  Health Literacy: Not on file    Review of Systems   General: Negative for anorexia, weight loss, fever, chills, fatigue, weakness. ENT: Negative for hoarseness, difficulty swallowing , nasal congestion. CV: Negative for chest pain, angina, palpitations, dyspnea on exertion, peripheral edema.  Respiratory: Negative for dyspnea at rest, dyspnea on exertion, cough, sputum, wheezing.  GI: See history of present illness. GU:  Negative for dysuria, hematuria, urinary incontinence, urinary frequency, nocturnal urination.  Endo: Negative for unusual weight change.     Physical Exam   BP (!) 141/82   Pulse 75   Temp 97.6 F (36.4 C) (Oral)   Ht 5' 11 (1.803 m)   Wt 177 lb 3.2 oz (80.4 kg)   SpO2 98%   BMI 24.71 kg/m    General: Well-nourished, well-developed in no acute distress.  Eyes: No icterus. Mouth: Oropharyngeal mucosa moist and pink   Lungs: Clear to auscultation bilaterally.  Heart: Regular rate and rhythm, no murmurs rubs or gallops.  Abdomen: Bowel sounds are normal, nontender, nondistended, no hepatosplenomegaly or masses,  no abdominal bruits or hernia , no rebound or guarding.  Rectal: not performed Extremities: No lower extremity edema. No clubbing or deformities. Neuro: Alert and oriented x 4   Skin: Warm and dry, no jaundice.   Psych: Alert and cooperative, normal mood and affect.  Labs   *** Imaging Studies   DG Toe Great Right Result Date: 03/01/2024 EXAM: 3 VIEW(S) XRAY OF THE TOES 03/01/2024 03:27:37 PM COMPARISON:  None available. CLINICAL HISTORY: h/o fracture. ongoing swelling FINDINGS: BONES AND JOINTS: Subacute appearing fracture of the midshaft of the first proximal phalanx. Incomplete bony bridging. Callus formation has increased from prior. Joint spaces are well maintained. No malalignment. SOFT TISSUES: Surrounding soft tissue swelling has increased. IMPRESSION: 1. Subacute fracture of the midshaft of the first proximal phalanx with incomplete bony bridging and increased callus formation. 2. Increased surrounding soft tissue swelling. Electronically signed by: Greig Pique MD 03/01/2024 03:33 PM EST RP Workstation: [id number]    Assessment/Plan:   Assessment and Plan Assessment & Plan Alcoholic hepatitis and alcohol withdrawal Recent heavy alcohol use causing elevated liver enzymes and withdrawal symptoms. Acute inflammation likely due to alcohol. - Continue gradual reduction of alcohol intake. - Discussed potential medications for withdrawal management. - Scheduled upper endoscopy for gastritis or ulcers evaluation.  Unintentional weight loss with anorexia and nausea Significant weight loss with poor appetite, nausea, and occasional melena. Differential includes gastritis or ulcers. - Scheduled upper endoscopy for gastritis  or ulcers evaluation.  Fatty liver disease Previous imaging showed fatty liver. Elevated liver enzymes likely due to alcohol. No advanced liver disease. - Monitor liver function tests. - Encouraged reduction in alcohol consumption.  Irritable bowel syndrome with diarrhea IBS-D symptoms improved with lifestyle changes.  Gastroesophageal reflux disease GERD symptoms well-controlled with medication. - Continue GERD management with omeprazole .  Recording duration: 13 minutes    U/S 12/23       Seth Mcmillan. Ezzard, MHS, PA-C [facility]

## 2024-03-03 ENCOUNTER — Encounter: Payer: Self-pay | Admitting: Podiatry

## 2024-03-03 ENCOUNTER — Telehealth: Payer: Self-pay | Admitting: *Deleted

## 2024-03-03 ENCOUNTER — Ambulatory Visit: Admitting: Podiatry

## 2024-03-03 ENCOUNTER — Ambulatory Visit (INDEPENDENT_AMBULATORY_CARE_PROVIDER_SITE_OTHER)

## 2024-03-03 DIAGNOSIS — M7751 Other enthesopathy of right foot: Secondary | ICD-10-CM

## 2024-03-03 DIAGNOSIS — M7752 Other enthesopathy of left foot: Secondary | ICD-10-CM | POA: Diagnosis not present

## 2024-03-03 DIAGNOSIS — S92414D Nondisplaced fracture of proximal phalanx of right great toe, subsequent encounter for fracture with routine healing: Secondary | ICD-10-CM

## 2024-03-03 DIAGNOSIS — S92411A Displaced fracture of proximal phalanx of right great toe, initial encounter for closed fracture: Secondary | ICD-10-CM | POA: Diagnosis not present

## 2024-03-03 DIAGNOSIS — M778 Other enthesopathies, not elsewhere classified: Secondary | ICD-10-CM

## 2024-03-03 NOTE — Telephone Encounter (Signed)
 Notification/Precertification Requirement This member's plan does not currently require notification or prior-authorization through the [employer] Notification or Prior-Authorization Program. Please contact a Customer Care Professional at [phone] if you believe the information returned to be in error.

## 2024-03-03 NOTE — Telephone Encounter (Signed)
 Spoke with pt. He has been scheduled for EGD with Dr. Cindie ASAP 12/18. Aware will send instructions via mychart

## 2024-03-04 ENCOUNTER — Encounter (HOSPITAL_COMMUNITY): Payer: Self-pay | Admitting: Internal Medicine

## 2024-03-04 ENCOUNTER — Other Ambulatory Visit: Payer: Self-pay

## 2024-03-04 ENCOUNTER — Ambulatory Visit (HOSPITAL_COMMUNITY): Admitting: Anesthesiology

## 2024-03-04 ENCOUNTER — Encounter (HOSPITAL_COMMUNITY)
Admission: RE | Disposition: A | Discharge status: Home / Self Care | Payer: Self-pay | Source: Home / Self Care | Attending: Internal Medicine

## 2024-03-04 ENCOUNTER — Ambulatory Visit (HOSPITAL_COMMUNITY)
Admission: RE | Admit: 2024-03-04 | Discharge: 2024-03-04 | Disposition: A | Discharge status: Home / Self Care | Attending: Internal Medicine | Admitting: Internal Medicine

## 2024-03-04 DIAGNOSIS — K31A19 Gastric intestinal metaplasia without dysplasia, unspecified site: Secondary | ICD-10-CM

## 2024-03-04 DIAGNOSIS — Z6824 Body mass index (BMI) 24.0-24.9, adult: Secondary | ICD-10-CM | POA: Insufficient documentation

## 2024-03-04 DIAGNOSIS — K760 Fatty (change of) liver, not elsewhere classified: Secondary | ICD-10-CM | POA: Insufficient documentation

## 2024-03-04 DIAGNOSIS — B379 Candidiasis, unspecified: Secondary | ICD-10-CM | POA: Diagnosis not present

## 2024-03-04 DIAGNOSIS — R634 Abnormal weight loss: Secondary | ICD-10-CM | POA: Diagnosis not present

## 2024-03-04 DIAGNOSIS — I10 Essential (primary) hypertension: Secondary | ICD-10-CM | POA: Diagnosis not present

## 2024-03-04 DIAGNOSIS — F418 Other specified anxiety disorders: Secondary | ICD-10-CM

## 2024-03-04 DIAGNOSIS — K2970 Gastritis, unspecified, without bleeding: Secondary | ICD-10-CM | POA: Insufficient documentation

## 2024-03-04 DIAGNOSIS — Z87891 Personal history of nicotine dependence: Secondary | ICD-10-CM | POA: Diagnosis not present

## 2024-03-04 DIAGNOSIS — Z79899 Other long term (current) drug therapy: Secondary | ICD-10-CM | POA: Insufficient documentation

## 2024-03-04 DIAGNOSIS — R110 Nausea: Secondary | ICD-10-CM | POA: Diagnosis present

## 2024-03-04 DIAGNOSIS — K222 Esophageal obstruction: Secondary | ICD-10-CM | POA: Insufficient documentation

## 2024-03-04 DIAGNOSIS — F419 Anxiety disorder, unspecified: Secondary | ICD-10-CM | POA: Diagnosis not present

## 2024-03-04 DIAGNOSIS — F32A Depression, unspecified: Secondary | ICD-10-CM | POA: Insufficient documentation

## 2024-03-04 DIAGNOSIS — K31A11 Gastric intestinal metaplasia without dysplasia, involving the antrum: Secondary | ICD-10-CM | POA: Diagnosis not present

## 2024-03-04 DIAGNOSIS — F17290 Nicotine dependence, other tobacco product, uncomplicated: Secondary | ICD-10-CM | POA: Insufficient documentation

## 2024-03-04 DIAGNOSIS — K219 Gastro-esophageal reflux disease without esophagitis: Secondary | ICD-10-CM | POA: Insufficient documentation

## 2024-03-04 HISTORY — PX: ESOPHAGOGASTRODUODENOSCOPY: SHX5428

## 2024-03-04 HISTORY — PX: ESOPHAGEAL BRUSHING: SHX6842

## 2024-03-04 LAB — CBC WITH DIFFERENTIAL/PLATELET
Basophils Absolute: 0.1 x10E3/uL (ref 0.0–0.2)
Basos: 1 %
EOS (ABSOLUTE): 0.1 x10E3/uL (ref 0.0–0.4)
Eos: 1 %
Hematocrit: 41.4 % (ref 37.5–51.0)
Hemoglobin: 14.1 g/dL (ref 13.0–17.7)
Immature Grans (Abs): 0 x10E3/uL (ref 0.0–0.1)
Immature Granulocytes: 0 %
Lymphocytes Absolute: 0.9 x10E3/uL (ref 0.7–3.1)
Lymphs: 15 %
MCH: 32.8 pg (ref 26.6–33.0)
MCHC: 34.1 g/dL (ref 31.5–35.7)
MCV: 96 fL (ref 79–97)
Monocytes Absolute: 1 x10E3/uL — ABNORMAL HIGH (ref 0.1–0.9)
Monocytes: 16 %
Neutrophils Absolute: 4.1 x10E3/uL (ref 1.4–7.0)
Neutrophils: 67 %
Platelets: 284 x10E3/uL (ref 150–450)
RBC: 4.3 x10E6/uL (ref 4.14–5.80)
RDW: 13.9 % (ref 11.6–15.4)
WBC: 6.2 x10E3/uL (ref 3.4–10.8)

## 2024-03-04 LAB — KOH PREP

## 2024-03-04 LAB — HEPATIC FUNCTION PANEL
ALT: 87 IU/L — ABNORMAL HIGH (ref 0–44)
AST: 80 IU/L — ABNORMAL HIGH (ref 0–40)
Albumin: 4.2 g/dL (ref 3.8–4.9)
Alkaline Phosphatase: 62 IU/L (ref 47–123)
Bilirubin Total: 0.3 mg/dL (ref 0.0–1.2)
Bilirubin, Direct: 0.16 mg/dL (ref 0.00–0.40)
Total Protein: 6.6 g/dL (ref 6.0–8.5)

## 2024-03-04 LAB — FOLATE: Folate: 15 ng/mL (ref >3.0)

## 2024-03-04 LAB — VITAMIN B1: Thiamine: 92.7 nmol/L (ref 66.5–200.0)

## 2024-03-04 SURGERY — EGD (ESOPHAGOGASTRODUODENOSCOPY)
Anesthesia: Monitor Anesthesia Care

## 2024-03-04 MED ORDER — LACTATED RINGERS IV SOLN: INTRAVENOUS | Status: DC

## 2024-03-04 MED ORDER — PROPOFOL 500 MG/50ML IV EMUL
INTRAVENOUS | Status: DC | PRN
  Administered 2024-03-04: 14:00:00 100 mg via INTRAVENOUS
  Administered 2024-03-04: 14:00:00 50 mg via INTRAVENOUS
  Administered 2024-03-04: 14:00:00 150 ug/kg/min via INTRAVENOUS

## 2024-03-04 MED ORDER — DEXMEDETOMIDINE HCL IN NACL 80 MCG/20ML IV SOLN
INTRAVENOUS | Status: DC | PRN
  Administered 2024-03-04 (×2): 8 ug via INTRAVENOUS

## 2024-03-04 MED ORDER — LIDOCAINE 2% (20 MG/ML) 5 ML SYRINGE
INTRAMUSCULAR | Status: DC | PRN
  Administered 2024-03-04: 14:00:00 100 mg via INTRAVENOUS

## 2024-03-04 NOTE — Transfer of Care (Signed)
 Immediate Anesthesia Transfer of Care Note  Patient: Seth Mcmillan  Procedure(s) Performed: EGD (ESOPHAGOGASTRODUODENOSCOPY) ESOPHAGOSCOPY, WITH BRUSH BIOPSY  Patient Location: Short Stay  Anesthesia Type:MAC  Level of Consciousness: awake and patient cooperative  Airway & Oxygen Therapy: Patient Spontanous Breathing  Post-op Assessment: Report given to RN and Post -op Vital signs reviewed and stable  Post vital signs: Reviewed and stable  Last Vitals:  Vitals Value Taken Time  BP 106/70 03/04/24 14:09  Temp    Pulse 80 03/04/24 14:09  Resp 17 03/04/24 14:09  SpO2 95% on RA 03/04/24 14:09    Last Pain:  Vitals:   03/04/24 1352  TempSrc:   PainSc: 0-No pain      Patients Stated Pain Goal: 5 (03/04/24 1257)  Complications: No notable events documented.

## 2024-03-04 NOTE — Op Note (Signed)
 [facility] Patient Name: Seth Mcmillan Procedure Date: 03/04/2024 1:40 PM MRN: [id number] Date of Birth: [birth year] Attending MD: Carlin POUR. Cindie , [place], [id number] CSN: [id number] Age: 58 Admit Type: Outpatient Procedure:                Upper GI endoscopy Indications:              Nausea, Weight loss Providers:                Carlin POUR. Cindie, DO, Devere Lodge, Alm Dorcas Balm., Technician Referring MD:              Medicines:                See the Anesthesia note for documentation of the                            administered medications Complications:            No immediate complications. Estimated Blood Loss:     Estimated blood loss was minimal. Procedure:                Pre-Anesthesia Assessment:                           - The anesthesia plan was to use monitored                            anesthesia care (MAC).                           After obtaining informed consent, the endoscope was                            passed under direct vision. Throughout the                            procedure, the patient's blood pressure, pulse, and                            oxygen saturations were monitored continuously. The                            [id number] ([id number]) Upper was introduced through                            the mouth, and advanced to the second part of                            duodenum. The upper GI endoscopy was accomplished                            without difficulty. The patient tolerated the                            procedure well. Scope In: 1:58:26  PM Scope Out: 2:02:31 PM Total Procedure Duration: 0 hours 4 minutes 5 seconds  Findings:      White nummular lesions were noted in the middle third of the esophagus.       Cells for cytology were obtained by brushing.      Patchy mild inflammation was found in the entire examined stomach.       Biopsies were taken with a cold forceps for Helicobacter pylori testing.      The  duodenal bulb, first portion of the duodenum and second portion of       the duodenum were normal.      A non-obstructing Schatzki ring was found in the distal esophagus. Impression:               - White nummular lesions in esophageal mucosa.                            Cells for cytology obtained.                           - Gastritis. Biopsied.                           - Normal duodenal bulb, first portion of the                            duodenum and second portion of the duodenum.                           - Non-obstructing Schatzki ring. Moderate Sedation:      Per Anesthesia Care Recommendation:           - Patient has a contact number available for                            emergencies. The signs and symptoms of potential                            delayed complications were discussed with the                            patient. Return to normal activities tomorrow.                            Written discharge instructions were provided to the                            patient.                           - Resume previous diet.                           - Continue present medications.                           - Await pathology results.                           -  Use Prilosec (omeprazole ) 40 mg PO daily.                           - Return to GI clinic in 3 months.                           - Treat for candidal esophagitis if cytology                            positive. Procedure Code(s):        --- Professional ---                           [phone], Esophagogastroduodenoscopy, flexible,                            transoral; with biopsy, single or multiple Diagnosis Code(s):        --- Professional ---                           K22.89, Other specified disease of esophagus                           K29.70, Gastritis, unspecified, without bleeding                           R11.0, Nausea                           R63.4, Abnormal weight loss CPT copyright 2022 American Medical  Association. All rights reserved. The codes documented in this report are preliminary and upon coder review may  be revised to meet current compliance requirements. Carlin POUR. Cindie, DO Carlin POUR. Cindie, DO 03/04/2024 2:07:07 PM This report has been signed electronically. Number of Addenda: 0

## 2024-03-04 NOTE — Discharge Instructions (Addendum)
 EGD Discharge instructions Please read the instructions outlined below and refer to this sheet in the next few weeks. These discharge instructions provide you with general information on caring for yourself after you leave the hospital. Your doctor may also give you specific instructions. While your treatment has been planned according to the most current medical practices available, unavoidable complications occasionally occur. If you have any problems or questions after discharge, please call your doctor. ACTIVITY You may resume your regular activity but move at a slower pace for the next 24 hours.  Take frequent rest periods for the next 24 hours.  Walking will help expel (get rid of) the air and reduce the bloated feeling in your abdomen.  No driving for 24 hours (because of the anesthesia (medicine) used during the test).  You may shower.  Do not sign any important legal documents or operate any machinery for 24 hours (because of the anesthesia used during the test).  NUTRITION Drink plenty of fluids.  You may resume your normal diet.  Begin with a light meal and progress to your normal diet.  Avoid alcoholic beverages for 24 hours or as instructed by your caregiver.  MEDICATIONS You may resume your normal medications unless your caregiver tells you otherwise.  WHAT YOU CAN EXPECT TODAY You may experience abdominal discomfort such as a feeling of fullness or gas pains.  FOLLOW-UP Your doctor will discuss the results of your test with you.  SEEK IMMEDIATE MEDICAL ATTENTION IF ANY OF THE FOLLOWING OCCUR: Excessive nausea (feeling sick to your stomach) and/or vomiting.  Severe abdominal pain and distention (swelling).  Trouble swallowing.  Temperature over 101 F (37.8 C).  Rectal bleeding or vomiting of blood.   Your EGD revealed mild amount inflammation in your stomach.  I took biopsies of this to rule out infection with a bacteria called H. pylori.  You also had evidence of  possible candidal esophagitis. This is a yeast infection. Await pathology results, my office will contact you.  Small bowel was normal.   Continue on omeprazole  daily.   If cytology positive we will treat you with 14 day course of fluconazole for yeast.   Follow up in GI office in 3 months. Message sent to the office.   I hope you have a great rest of your week!  Carlin POUR. Cindie, D.O. Gastroenterology and Hepatology [facility]

## 2024-03-04 NOTE — Anesthesia Preprocedure Evaluation (Signed)
 Anesthesia Evaluation  Patient identified by MRN, date of birth, ID band Patient awake    Reviewed: Allergy & Precautions, H&P , NPO status , Patient's Chart, lab work & pertinent test results, reviewed documented beta blocker date and time   Airway Mallampati: II  TM Distance: >3 FB Neck ROM: full    Dental no notable dental hx.    Pulmonary neg pulmonary ROS, former smoker   Pulmonary exam normal breath sounds clear to auscultation       Cardiovascular Exercise Tolerance: Good hypertension,  Rhythm:regular Rate:Normal     Neuro/Psych  PSYCHIATRIC DISORDERS Anxiety Depression    negative neurological ROS     GI/Hepatic Neg liver ROS,GERD  ,,  Endo/Other  negative endocrine ROS    Renal/GU negative Renal ROS  negative genitourinary   Musculoskeletal   Abdominal   Peds  Hematology negative hematology ROS (+)   Anesthesia Other Findings   Reproductive/Obstetrics negative OB ROS                              Anesthesia Physical Anesthesia Plan  ASA: 2  Anesthesia Plan: MAC   Post-op Pain Management:    Induction:   PONV Risk Score and Plan: Propofol  infusion  Airway Management Planned:   Additional Equipment:   Intra-op Plan:   Post-operative Plan:   Informed Consent: I have reviewed the patients History and Physical, chart, labs and discussed the procedure including the risks, benefits and alternatives for the proposed anesthesia with the patient or authorized representative who has indicated his/her understanding and acceptance.     Dental Advisory Given  Plan Discussed with: CRNA  Anesthesia Plan Comments:         Anesthesia Quick Evaluation

## 2024-03-04 NOTE — Progress Notes (Signed)
 Subjective:   Patient ID: Seth Mcmillan, male   DOB: 58 y.o.   MRN: [id number]   HPI Patient states that he had a severe fracture of his right big toe 3 months ago it was not truly treated in the beginning it still is remaining swollen and sore and he is concerned about being able to be active.  States that he does not currently smoke likes to be active   Review of Systems  All other systems reviewed and are negative.       Objective:  Physical Exam Vitals and nursing note reviewed.  Constitutional:      Appearance: He is well-developed.  Pulmonary:     Effort: Pulmonary effort is normal.  Musculoskeletal:        General: Normal range of motion.  Skin:    General: Skin is warm.  Neurological:     Mental Status: He is alert.     Neurovascular status intact muscle strength found to be adequate range of motion within normal limits with patient found to have swollen right hallux with the probability of a fracture of the proximal phalanx with moderate lifting of the distal portion secondary to the injury.  Good digital perfusion well-oriented x 3     Assessment:  Fracture of the right hallux that is slow in healing with abnormal positioning component     Plan:  H&P reviewed x-ray at great length.  We discussed the possibility for nonunion and the possibility for bone stimulator or surgery depending on how it responds at this time.  I do want to try to give it more time to heal and at this point I did cast him for a functional type orthotic and on the right I am going to do a graphite in extension underneath the right hallux so that it does not bend and hopefully facilitates the healing process.  We will be evaluating this over the next couple months and if it does not heal then ultimately it will require more advanced treatment  X-ray indicates a severe fracture of the mid proximal phalanx right big toe with separation and elevation of the distal segment.  There is signs of healing  but there is a lucency so difficult to make determination as to whether or not this is going through a healing process or we will end up with

## 2024-03-04 NOTE — Interval H&P Note (Signed)
 History and Physical Interval Note:  03/04/2024 1:08 PM  Marty VEAR Poli  has presented today for surgery, with the diagnosis of NAUSEA, EARLY SATIETY, WEIGHT LOSS.  The various methods of treatment have been discussed with the patient and family. After consideration of risks, benefits and other options for treatment, the patient has consented to  Procedures with comments: EGD (ESOPHAGOGASTRODUODENOSCOPY) (N/A) - 200PM, OK RM 1 as a surgical intervention.  The patient's history has been reviewed, patient examined, no change in status, stable for surgery.  I have reviewed the patient's chart and labs.  Questions were answered to the patient's satisfaction.     Carlin MARLA Hasty

## 2024-03-05 ENCOUNTER — Encounter (HOSPITAL_COMMUNITY): Payer: Self-pay | Admitting: Internal Medicine

## 2024-03-05 ENCOUNTER — Encounter: Payer: Self-pay | Admitting: *Deleted

## 2024-03-05 LAB — SURGICAL PATHOLOGY

## 2024-03-05 NOTE — Anesthesia Postprocedure Evaluation (Signed)
"   Anesthesia Post Note  Patient: Seth Mcmillan  Procedure(s) Performed: EGD (ESOPHAGOGASTRODUODENOSCOPY) ESOPHAGOSCOPY, WITH BRUSH BIOPSY  Patient location during evaluation: Phase II Anesthesia Type: MAC Level of consciousness: awake Pain management: pain level controlled Vital Signs Assessment: post-procedure vital signs reviewed and stable Respiratory status: spontaneous breathing and respiratory function stable Cardiovascular status: blood pressure returned to baseline and stable Postop Assessment: no headache and no apparent nausea or vomiting Anesthetic complications: no Comments: Late entry   No notable events documented.   Last Vitals:  Vitals:   03/04/24 1257 03/04/24 1408  BP: (!) 149/88 106/70  Pulse: 77 79  Resp: 17 19  Temp: 36.9 C 36.7 C  SpO2: 96% 95%    Last Pain:  Vitals:   03/04/24 1408  TempSrc: Oral  PainSc: 0-No pain                 Yvonna PARAS Palma Buster      "

## 2024-03-09 ENCOUNTER — Ambulatory Visit (HOSPITAL_COMMUNITY)
Admission: RE | Admit: 2024-03-09 | Discharge: 2024-03-09 | Disposition: A | Discharge status: Home / Self Care | Source: Ambulatory Visit | Attending: Family Medicine | Admitting: Family Medicine

## 2024-03-09 DIAGNOSIS — K760 Fatty (change of) liver, not elsewhere classified: Secondary | ICD-10-CM | POA: Insufficient documentation

## 2024-03-09 DIAGNOSIS — F1010 Alcohol abuse, uncomplicated: Secondary | ICD-10-CM | POA: Diagnosis present

## 2024-03-10 ENCOUNTER — Telehealth: Payer: Self-pay

## 2024-03-10 NOTE — Telephone Encounter (Signed)
 Will call once resulted. LS

## 2024-03-10 NOTE — Telephone Encounter (Signed)
 Copied from CRM [id number]. Topic: Clinical - Lab/Test Results >> Mar 09, 2024  4:57 PM Selinda RAMAN wrote: Reason for CRM: The patient called in stating he would like a call as soon as possible or when his U/S results are back. He saw a preliminary report but would like to know for sure. Please assist patient further.

## 2024-03-15 ENCOUNTER — Encounter: Payer: Self-pay | Admitting: Family Medicine

## 2024-03-16 NOTE — Telephone Encounter (Signed)
 Spoke to pt. He is not passing out. He is just having trouble with his knee giving out. Wants to wait till upcoming apt.

## 2024-03-22 ENCOUNTER — Ambulatory Visit

## 2024-03-24 ENCOUNTER — Ambulatory Visit: Admitting: Family Medicine

## 2024-04-01 ENCOUNTER — Other Ambulatory Visit: Payer: Self-pay

## 2024-04-01 DIAGNOSIS — Z87891 Personal history of nicotine dependence: Secondary | ICD-10-CM

## 2024-04-01 DIAGNOSIS — Z122 Encounter for screening for malignant neoplasm of respiratory organs: Secondary | ICD-10-CM

## 2024-04-02 ENCOUNTER — Telehealth: Payer: Self-pay | Admitting: Podiatry

## 2024-04-02 NOTE — Telephone Encounter (Signed)
 Spoke to [employer]. [facility] are ready for pickup in the [place] office. Patient made an appointment to pick them up on 04/27/24 at 1:30pm; will be out of town the remainder of January.

## 2024-04-23 ENCOUNTER — Encounter: Payer: Self-pay | Admitting: Family Medicine

## 2024-04-23 ENCOUNTER — Ambulatory Visit: Admitting: Family Medicine

## 2024-04-23 VITALS — BP 162/85 | HR 85 | Temp 98.7°F | Ht 71.0 in | Wt 174.8 lb

## 2024-04-23 DIAGNOSIS — K219 Gastro-esophageal reflux disease without esophagitis: Secondary | ICD-10-CM

## 2024-04-23 DIAGNOSIS — I10 Essential (primary) hypertension: Secondary | ICD-10-CM

## 2024-04-23 DIAGNOSIS — F1010 Alcohol abuse, uncomplicated: Secondary | ICD-10-CM

## 2024-04-23 DIAGNOSIS — R079 Chest pain, unspecified: Secondary | ICD-10-CM

## 2024-04-23 MED ORDER — OMEPRAZOLE 40 MG PO CPDR: 40.0000 mg | DELAYED_RELEASE_CAPSULE | Freq: Every day | ORAL | 0 refills | Status: AC

## 2024-04-23 MED ORDER — FAMOTIDINE 20 MG PO TABS: 20.0000 mg | ORAL_TABLET | Freq: Two times a day (BID) | ORAL | 0 refills | Status: AC | PRN

## 2024-04-23 NOTE — Progress Notes (Signed)
 "  Acute Office Visit  Subjective:     Patient ID: Seth Mcmillan, male    DOB: Aug 07, 1965, 59 y.o.   MRN: [id number]  Chief Complaint  Patient presents with   Chest Pain    HPI  History of Present Illness   Seth Mcmillan is a 59 year old male with acid reflux who presents with chest pain.  Chest pain - Intermittent chest pain for the past five days - Episodes last minutes to hours, occurring 4-5 times daily - One episode lasted over an hour while sitting on the couch last night - Pain primarily located on the right side and down midline of the chest - Burning quality most times - Pain does not radiate - No pain in neck, jaw, shoulder, arm - Burning epigastric pain occurs with chest pain at times - No shortness of breath, headaches, lightheadedness, dizziness, vision changes, weakness, paresthesia, vomiting - Nausea sometimes accompanies the pain - No alleviating or aggravating factors.   Gastroesophageal reflux symptoms - History of acid reflux - Increased heartburn, indigestion, and burping lately - Occasional regurgitation and burning sour taste - On omeprazole  20 mg, usually taken at night - No use of Tums or other antacids for relief - Has seen GI recently for consult of elevated LFTs - Recently underwent EGD, unaware of results  Hypertension - On amlodipine , dose unchanged recently - No home blood pressure monitoring - Feeling anxious at appointment today  Alcohol use - Consumes 8-10 alcoholic drinks daily - Alcohol intake increased after separation from wife in September - Working on reducing alcohol consumption and acknowledges need for help. Declines medical intervention at this time and is working on it himself        ROS As per HPI.      Objective:    BP (!) 162/85   Pulse 85   Temp 98.7 F (37.1 C) (Temporal)   Ht 5' 11 (1.803 m)   Wt 174 lb 12.8 oz (79.3 kg)   SpO2 95%   BMI 24.38 kg/m  BP Readings from Last 3 Encounters:  04/23/24 (!)  162/85  03/04/24 106/70  03/02/24 (!) 141/82      Physical Exam Vitals reviewed.  Constitutional:      General: He is not in acute distress.    Appearance: He is not ill-appearing, toxic-appearing or diaphoretic.  Cardiovascular:     Rate and Rhythm: Regular rhythm.     Heart sounds: Normal heart sounds. No murmur heard. Pulmonary:     Effort: Pulmonary effort is normal. No respiratory distress.     Breath sounds: Normal breath sounds. No wheezing, rhonchi or rales.  Abdominal:     General: Bowel sounds are normal. There is no distension.     Palpations: Abdomen is soft.     Tenderness: There is abdominal tenderness in the epigastric area.  Musculoskeletal:     Right lower leg: No edema.     Left lower leg: No edema.  Skin:    General: Skin is warm and dry.  Neurological:     General: No focal deficit present.     Mental Status: He is alert and oriented to person, place, and time.     Motor: No weakness.     Gait: Gait normal.  Psychiatric:        Mood and Affect: Mood normal.        Behavior: Behavior normal.     No results found for any visits on 04/23/24.  Assessment & Plan:   Keyen was seen today for chest pain.  Diagnoses and all orders for this visit:  Chest pain, unspecified type -     EKG 12-Lead -     Magnesium  -     BMP8+EGFR -     CBC with Differential/Platelet -     Ambulatory referral to Cardiology  Gastroesophageal reflux disease without esophagitis -     BMP8+EGFR -     CBC with Differential/Platelet -     omeprazole  (PRILOSEC) 40 MG capsule; Take 1 capsule (40 mg total) by mouth daily. -     famotidine  (PEPCID ) 20 MG tablet; Take 1 tablet (20 mg total) by mouth 2 (two) times daily as needed for heartburn or indigestion.  Chronic alcohol abuse -     Magnesium  -     BMP8+EGFR -     CBC with Differential/Platelet  Essential hypertension   Assessment and Plan    Chest pain  Intermittent chest pain x 5 days- right side, midline. EKG  with NSR, unchanged from previous today. Suspect GERD as etiology. Will adjust medications for GERD. Will check labs and place urgent referral to cardiology for further evaluation of possible cardiac etiology.  - Referred to cardiologist for further evaluation and testing. - Ordered lab work including magnesium  level, blood counts, kidney, and liver function tests. - Discussed when to seek emergency care  Gastroesophageal reflux disease Chronic GERD with increased symptoms. Current omeprazole  dose insufficient. - Increased omeprazole  to 40 mg daily, to be taken on an empty stomach. - Prescribed famotidine  twice daily as needed for burning symptoms.  Alcohol abuse Ongoing alcohol abuse. He is working to cut back.  - Checked magnesium  level as part of lab work.  Hypertension Elevated blood pressure today, possibly anxiety-related. Recent readings well controlled. On amlodipine . - Monitor BP at home and notify for elevated readings.      Return to office for new or worsening symptoms, or if symptoms persist.   The patient indicates understanding of these issues and agrees with the plan.  Annabella CHRISTELLA Search, FNP   "

## 2024-04-23 NOTE — Patient Instructions (Signed)
 Chest Pain Without a Known Cause (Nonspecific Chest Pain) in Adults: What It Means Chest pain is an uncomfortable, tight, or painful feeling in the chest. The pain can feel like a crushing, aching, or squeezing pressure. A person can feel a burning or tingling sensation. Chest pain can also be felt in your back, neck, jaw, shoulder, or arm. This pain can be worse when you move, sneeze, or take a deep breath. Chest pain can be caused by a condition that is life-threatening. This must be treated right away. It can also be caused by something that is not life-threatening. If you have chest pain, it can be hard to know the difference, so it is important to get help right away to make sure that you do not have a serious condition. Some life-threatening causes of chest pain include: Heart attack. A tear in the body's main blood vessel (aortic dissection). Inflammation around your heart (pericarditis). A problem in the lungs, such as a blood clot (pulmonary embolism) or a collapsed lung (pneumothorax). Some non life-threatening causes of chest pain include: Heartburn. Anxiety or stress. Damage to the bones, muscles, and cartilage that make up your chest wall. Pneumonia or bronchitis. Shingles infection (varicella-zoster virus). Your chest pain may come and go. It may also be constant. Your health care provider will do tests and other studies to find the cause of your pain. Treatment will depend on the cause of your chest pain. Follow these instructions at home: Medicines Take over-the-counter and prescription medicines only as told by your health care provider. If you were prescribed an antibiotic medicine, take it as told by your health care provider. Do not stop taking the antibiotic even if you start to feel better. Activity Avoid any activities that cause chest pain. Do not lift anything that is heavier than 10 lb (4.5 kg), or the limit that you are told, until your health care provider says that  it is safe. Rest as directed by your health care provider. Return to your normal activities only as told by your health care provider. Ask your health care provider what activities are safe for you. Lifestyle     Do not use any products that contain nicotine or tobacco, such as cigarettes, e-cigarettes, and chewing tobacco. If you need help quitting, ask your health care provider. Do not drink alcohol. Make healthy lifestyle changes as recommended. These may include: Getting regular exercise. Ask your health care provider to suggest some exercises that are safe for you. Eating a heart-healthy diet. This includes plenty of fresh fruits and vegetables, whole grains, low-fat (lean) protein, and low-fat dairy products. A dietitian can help you find healthy eating options. Maintaining a healthy weight. Managing any other health conditions you may have, such as high blood pressure (hypertension) or diabetes. Reducing stress, such as with yoga or relaxation techniques. General instructions Pay attention to any changes in your symptoms. It is up to you to get the results of any tests that were done. Ask your health care provider, or the department that is doing the tests, when your results will be ready. Keep all follow-up visits as told by your health care provider. This is important. You may be asked to go for further testing if your chest pain does not go away. Contact a health care provider if: Your chest pain does not go away. You feel depressed. You have a fever. You notice changes in your symptoms or develop new symptoms. Get help right away if: Your chest pain gets  worse. You have a cough that gets worse, or you cough up blood. You have severe pain in your abdomen. You faint. You have sudden, unexplained chest discomfort. You have sudden, unexplained discomfort in your arms, back, neck, or jaw. You have shortness of breath at any time. You suddenly start to sweat, or your skin gets  clammy. You feel nausea or you vomit. You suddenly feel lightheaded or dizzy. You have severe weakness, or unexplained weakness or fatigue. Your heart begins to beat quickly, or it feels like it is skipping beats. These symptoms may represent a serious problem that is an emergency. Do not wait to see if the symptoms will go away. Get medical help right away. Call your local emergency services (911 in the [place] Do not drive yourself to the hospital. Summary Chest pain can be caused by a condition that is serious and requires urgent treatment. It may also be caused by something that is not life-threatening. Your health care provider may do lab tests and other studies to find the cause of your pain. Follow your health care provider's instructions on taking medicines, making lifestyle changes, and getting emergency treatment if symptoms become worse. Keep all follow-up visits as told by your health care provider. This includes visits for any further testing if your chest pain does not go away. This information is not intended to replace advice given to you by your health care provider. Make sure you discuss any questions you have with your health care provider. Document Revised: 01/14/2024 Document Reviewed: 01/17/2022 Elsevier Patient Education  2025 [employer].

## 2024-04-27 ENCOUNTER — Encounter

## 2024-05-10 ENCOUNTER — Ambulatory Visit: Admitting: Family Medicine

## 2024-05-11 ENCOUNTER — Inpatient Hospital Stay: Admitting: *Deleted

## 2024-06-22 ENCOUNTER — Encounter: Payer: Self-pay | Admitting: Family Medicine
